# Patient Record
Sex: Female | Born: 1960 | Race: White | Hispanic: No | Marital: Single | Smoking: Current every day smoker
Health system: Southern US, Community
[De-identification: ages and names within clinical notes are randomized; demographics above are authoritative.]

## PROBLEM LIST (undated history)

## (undated) DIAGNOSIS — K219 Gastro-esophageal reflux disease without esophagitis: Secondary | ICD-10-CM

## (undated) DIAGNOSIS — F329 Major depressive disorder, single episode, unspecified: Secondary | ICD-10-CM

## (undated) DIAGNOSIS — H269 Unspecified cataract: Secondary | ICD-10-CM

## (undated) DIAGNOSIS — D259 Leiomyoma of uterus, unspecified: Secondary | ICD-10-CM

## (undated) DIAGNOSIS — E7800 Pure hypercholesterolemia, unspecified: Secondary | ICD-10-CM

## (undated) DIAGNOSIS — I10 Essential (primary) hypertension: Secondary | ICD-10-CM

## (undated) DIAGNOSIS — F32A Depression, unspecified: Secondary | ICD-10-CM

## (undated) DIAGNOSIS — Z9089 Acquired absence of other organs: Secondary | ICD-10-CM

## (undated) DIAGNOSIS — J45909 Unspecified asthma, uncomplicated: Secondary | ICD-10-CM

## (undated) DIAGNOSIS — K5090 Crohn's disease, unspecified, without complications: Secondary | ICD-10-CM

## (undated) DIAGNOSIS — R0602 Shortness of breath: Secondary | ICD-10-CM

## (undated) HISTORY — DX: Unspecified cataract: H26.9

## (undated) HISTORY — PX: OTHER SURGICAL HISTORY: SHX169

## (undated) HISTORY — PX: TONSILLECTOMY: SUR1361

## (undated) HISTORY — PX: LASIK: SHX215

## (undated) HISTORY — DX: Unspecified asthma, uncomplicated: J45.909

## (undated) HISTORY — DX: Leiomyoma of uterus, unspecified: D25.9

## (undated) HISTORY — DX: Acquired absence of other organs: Z90.89

## (undated) HISTORY — PX: TONSILLECTOMY AND ADENOIDECTOMY: SHX28

---

## 2010-11-27 ENCOUNTER — Emergency Department (HOSPITAL_COMMUNITY): Payer: Self-pay

## 2010-11-27 ENCOUNTER — Emergency Department (HOSPITAL_COMMUNITY)
Admission: EM | Admit: 2010-11-27 | Discharge: 2010-11-27 | Disposition: A | Discharge status: Home / Self Care | Payer: Self-pay | Attending: Emergency Medicine | Admitting: Emergency Medicine

## 2010-11-27 DIAGNOSIS — R300 Dysuria: Secondary | ICD-10-CM | POA: Insufficient documentation

## 2010-11-27 DIAGNOSIS — R0789 Other chest pain: Secondary | ICD-10-CM | POA: Insufficient documentation

## 2010-11-27 DIAGNOSIS — N63 Unspecified lump in unspecified breast: Secondary | ICD-10-CM | POA: Insufficient documentation

## 2010-11-27 DIAGNOSIS — R209 Unspecified disturbances of skin sensation: Secondary | ICD-10-CM | POA: Insufficient documentation

## 2010-11-27 DIAGNOSIS — R42 Dizziness and giddiness: Secondary | ICD-10-CM | POA: Insufficient documentation

## 2010-11-27 LAB — APTT: aPTT: 34 seconds (ref 24–37)

## 2010-11-27 LAB — URINALYSIS, ROUTINE W REFLEX MICROSCOPIC
Bilirubin Urine: NEGATIVE
Glucose, UA: NEGATIVE mg/dL
Ketones, ur: NEGATIVE mg/dL
pH: 6 (ref 5.0–8.0)

## 2010-11-27 LAB — CBC
MCH: 29.4 pg (ref 26.0–34.0)
MCV: 86.4 fL (ref 78.0–100.0)
Platelets: 167 10*3/uL (ref 150–400)
RDW: 13.1 % (ref 11.5–15.5)
WBC: 6.8 10*3/uL (ref 4.0–10.5)

## 2010-11-27 LAB — BASIC METABOLIC PANEL
CO2: 25 mEq/L (ref 19–32)
Chloride: 101 mEq/L (ref 96–112)
Potassium: 3.9 mEq/L (ref 3.5–5.1)
Sodium: 136 mEq/L (ref 135–145)

## 2010-11-27 LAB — TROPONIN I: Troponin I: <0.3 ng/mL (ref <0.30)

## 2010-11-27 LAB — URINE MICROSCOPIC-ADD ON

## 2010-11-27 LAB — DIFFERENTIAL
Eosinophils Absolute: 0.3 10*3/uL (ref 0.0–0.7)
Eosinophils Relative: 4 % (ref 0–5)
Lymphs Abs: 1.8 10*3/uL (ref 0.7–4.0)

## 2011-01-31 ENCOUNTER — Emergency Department (HOSPITAL_COMMUNITY): Payer: Self-pay

## 2011-01-31 ENCOUNTER — Emergency Department (HOSPITAL_COMMUNITY)
Admission: EM | Admit: 2011-01-31 | Discharge: 2011-01-31 | Disposition: A | Discharge status: Home / Self Care | Payer: Self-pay | Attending: Emergency Medicine | Admitting: Emergency Medicine

## 2011-01-31 DIAGNOSIS — N938 Other specified abnormal uterine and vaginal bleeding: Secondary | ICD-10-CM | POA: Insufficient documentation

## 2011-01-31 DIAGNOSIS — N949 Unspecified condition associated with female genital organs and menstrual cycle: Secondary | ICD-10-CM | POA: Insufficient documentation

## 2011-01-31 DIAGNOSIS — R5381 Other malaise: Secondary | ICD-10-CM | POA: Insufficient documentation

## 2011-01-31 DIAGNOSIS — Z79899 Other long term (current) drug therapy: Secondary | ICD-10-CM | POA: Insufficient documentation

## 2011-01-31 DIAGNOSIS — E7800 Pure hypercholesterolemia, unspecified: Secondary | ICD-10-CM | POA: Insufficient documentation

## 2011-01-31 DIAGNOSIS — R5383 Other fatigue: Secondary | ICD-10-CM | POA: Insufficient documentation

## 2011-01-31 LAB — WET PREP, GENITAL
Trich, Wet Prep: NONE SEEN
Yeast Wet Prep HPF POC: NONE SEEN

## 2011-01-31 LAB — DIFFERENTIAL
Basophils Absolute: 0 10*3/uL (ref 0.0–0.1)
Eosinophils Absolute: 0.2 10*3/uL (ref 0.0–0.7)
Lymphocytes Relative: 24 % (ref 12–46)
Lymphs Abs: 1.4 10*3/uL (ref 0.7–4.0)
Neutrophils Relative %: 65 % (ref 43–77)

## 2011-01-31 LAB — CBC
HCT: 40.7 % (ref 36.0–46.0)
MCV: 86.2 fL (ref 78.0–100.0)
Platelets: 178 10*3/uL (ref 150–400)
RBC: 4.72 MIL/uL (ref 3.87–5.11)
WBC: 5.8 10*3/uL (ref 4.0–10.5)

## 2011-02-01 LAB — GC/CHLAMYDIA PROBE AMP, GENITAL: Chlamydia, DNA Probe: NEGATIVE

## 2011-06-03 ENCOUNTER — Other Ambulatory Visit: Payer: Self-pay

## 2011-06-03 ENCOUNTER — Emergency Department (HOSPITAL_COMMUNITY)
Admission: EM | Admit: 2011-06-03 | Discharge: 2011-06-04 | Disposition: A | Discharge status: Home / Self Care | Payer: Self-pay | Attending: Emergency Medicine | Admitting: Emergency Medicine

## 2011-06-03 ENCOUNTER — Encounter (HOSPITAL_COMMUNITY): Payer: Self-pay | Admitting: *Deleted

## 2011-06-03 DIAGNOSIS — R109 Unspecified abdominal pain: Secondary | ICD-10-CM | POA: Insufficient documentation

## 2011-06-03 DIAGNOSIS — F419 Anxiety disorder, unspecified: Secondary | ICD-10-CM

## 2011-06-03 DIAGNOSIS — R0789 Other chest pain: Secondary | ICD-10-CM | POA: Insufficient documentation

## 2011-06-03 DIAGNOSIS — F411 Generalized anxiety disorder: Secondary | ICD-10-CM | POA: Insufficient documentation

## 2011-06-03 HISTORY — DX: Major depressive disorder, single episode, unspecified: F32.9

## 2011-06-03 HISTORY — DX: Depression, unspecified: F32.A

## 2011-06-03 LAB — COMPREHENSIVE METABOLIC PANEL
Albumin: 4 g/dL (ref 3.5–5.2)
Alkaline Phosphatase: 119 U/L — ABNORMAL HIGH (ref 39–117)
BUN: 7 mg/dL (ref 6–23)
CO2: 26 mEq/L (ref 19–32)
Chloride: 105 mEq/L (ref 96–112)
Creatinine, Ser: 0.48 mg/dL — ABNORMAL LOW (ref 0.50–1.10)
GFR calc Af Amer: >90 mL/min (ref >90)
GFR calc non Af Amer: >90 mL/min (ref >90)
Glucose, Bld: 97 mg/dL (ref 70–99)
Potassium: 3.6 mEq/L (ref 3.5–5.1)
Total Bilirubin: 0.3 mg/dL (ref 0.3–1.2)

## 2011-06-03 LAB — CBC
HCT: 41.1 % (ref 36.0–46.0)
MCH: 29.5 pg (ref 26.0–34.0)
MCHC: 34.3 g/dL (ref 30.0–36.0)
RDW: 13.7 % (ref 11.5–15.5)

## 2011-06-03 LAB — POCT I-STAT TROPONIN I: Troponin i, poc: 0.01 ng/mL (ref 0.00–0.08)

## 2011-06-03 LAB — CK TOTAL AND CKMB (NOT AT ARMC): CK, MB: 1.9 ng/mL (ref 0.3–4.0)

## 2011-06-03 NOTE — ED Notes (Signed)
Chest pain vomiting for 3-4 days with numbness in her arms and her ears have been ringing

## 2011-06-03 NOTE — ED Notes (Signed)
New and old ecg shown to edp [place]

## 2011-06-04 ENCOUNTER — Emergency Department (HOSPITAL_COMMUNITY): Payer: Self-pay

## 2011-06-04 LAB — LIPASE, BLOOD: Lipase: 26 U/L (ref 11–59)

## 2011-06-04 MED ORDER — FAMOTIDINE IN NACL 20-0.9 MG/50ML-% IV SOLN
20.0000 mg | Freq: Once | INTRAVENOUS | Status: AC
  Administered 2011-06-04: 20 mg via INTRAVENOUS
  Filled 2011-06-04: qty 50

## 2011-06-04 MED ORDER — ALPRAZOLAM 0.25 MG PO TABS: 0.2500 mg | ORAL_TABLET | Freq: Every evening | ORAL | Status: DC | PRN

## 2011-06-04 MED ORDER — SERTRALINE HCL 50 MG PO TABS: 50.0000 mg | ORAL_TABLET | Freq: Every day | ORAL | Status: DC

## 2011-06-04 MED ORDER — MORPHINE SULFATE 2 MG/ML IJ SOLN
2.0000 mg | Freq: Once | INTRAMUSCULAR | Status: AC
  Administered 2011-06-04: 2 mg via INTRAVENOUS
  Filled 2011-06-04: qty 1

## 2011-06-04 MED ORDER — LORAZEPAM 2 MG/ML IJ SOLN
1.0000 mg | Freq: Once | INTRAMUSCULAR | Status: AC
  Administered 2011-06-04: 1 mg via INTRAVENOUS
  Filled 2011-06-04: qty 1

## 2011-06-04 NOTE — Discharge Instructions (Signed)
Abdominal Pain (Nonspecific) Your exam might not show the exact reason you have abdominal pain. Since there are many different causes of abdominal pain, another checkup and more tests may be needed. It is very important to follow up for lasting (persistent) or worsening symptoms. A possible cause of abdominal pain in any person who still has his or her appendix is acute appendicitis. Appendicitis is often hard to diagnose. Normal blood tests, urine tests, ultrasound, and CT scans do not completely rule out early appendicitis or other causes of abdominal pain. Sometimes, only the changes that happen over time will allow appendicitis and other causes of abdominal pain to be determined. Other potential problems that may require surgery may also take time to become more apparent. Because of this, it is important that you follow all of the instructions below. HOME CARE INSTRUCTIONS   Rest as much as possible.   Do not eat solid food until your pain is gone.   While adults or children have pain: A diet of water, weak decaffeinated tea, broth or bouillon, gelatin, oral rehydration solutions (ORS), frozen ice pops, or ice chips may be helpful.   When pain is gone in adults or children: Start a light diet (dry toast, crackers, applesauce, or white rice). Increase the diet slowly as long as it does not bother you. Eat no dairy products (including cheese and eggs) and no spicy, fatty, fried, or high-fiber foods.   Use no alcohol, caffeine, or cigarettes.   Take your regular medicines unless your caregiver told you not to.   Take any prescribed medicine as directed.   Only take over-the-counter or prescription medicines for pain, discomfort, or fever as directed by your caregiver. Do not give aspirin to children.  If your caregiver has given you a follow-up appointment, it is very important to keep that appointment. Not keeping the appointment could result in a permanent injury and/or lasting (chronic) pain  and/or disability. If there is any problem keeping the appointment, you must call to reschedule.  SEEK IMMEDIATE MEDICAL CARE IF:   Your pain is not gone in 24 hours.   Your pain becomes worse, changes location, or feels different.   You or your child has an oral temperature above 102 F (38.9 C), not controlled by medicine.   Your baby is older than 3 months with a rectal temperature of 102 F (38.9 C) or higher.   Your baby is 63 months old or younger with a rectal temperature of 100.4 F (38 C) or higher.   You have shaking chills.   You keep throwing up (vomiting) or cannot drink liquids.   There is blood in your vomit or you see blood in your bowel movements.   Your bowel movements become dark or black.   You have frequent bowel movements.   Your bowel movements stop (become blocked) or you cannot pass gas.   You have bloody, frequent, or painful urination.   You have yellow discoloration in the skin or whites of the eyes.   Your stomach becomes bloated or bigger.   You have dizziness or fainting.   You have chest or back pain.  MAKE SURE YOU:   Understand these instructions.   Will watch your condition.   Will get help right away if you are not doing well or get worse.  Document Released: 03/25/2005 Document Revised: 12/05/2010 Document Reviewed: 02/20/2009 [facility] Patient Information 2012 [place], [place].     Anxiety and Panic Attacks Your caregiver has informed you  that you are having an anxiety or panic attack. There may be many forms of this. Most of the time these attacks come suddenly and without warning. They come at any time of day, including periods of sleep, and at any time of life. They may be strong and unexplained. Although panic attacks are very scary, they are physically harmless. Sometimes the cause of your anxiety is not known. Anxiety is a protective mechanism of the body in its fight or flight mechanism. Most of these perceived danger  situations are actually nonphysical situations (such as anxiety over losing a job). CAUSES  The causes of an anxiety or panic attack are many. Panic attacks may occur in otherwise healthy people given a certain set of circumstances. There may be a genetic cause for panic attacks. Some medications may also have anxiety as a side effect. SYMPTOMS  Some of the most common feelings are:  Intense terror.   Dizziness, feeling faint.   Hot and cold flashes.   Fear of going crazy.   Feelings that nothing is real.   Sweating.   Shaking.   Chest pain or a fast heartbeat (palpitations).   Smothering, choking sensations.   Feelings of impending doom and that death is near.   Tingling of extremities, this may be from over-breathing.   Altered reality (derealization).   Being detached from yourself (depersonalization).  Several symptoms can be present to make up anxiety or panic attacks. DIAGNOSIS  The evaluation by your caregiver will depend on the type of symptoms you are experiencing. The diagnosis of anxiety or panic attack is made when no physical illness can be determined to be a cause of the symptoms. TREATMENT  Treatment to prevent anxiety and panic attacks may include:  Avoidance of circumstances that cause anxiety.   Reassurance and relaxation.   Regular exercise.   Relaxation therapies, such as yoga.   Psychotherapy with a psychiatrist or therapist.   Avoidance of caffeine, alcohol and illegal drugs.   Prescribed medication.  SEEK IMMEDIATE MEDICAL CARE IF:   You experience panic attack symptoms that are different than your usual symptoms.   You have any worsening or concerning symptoms.  Document Released: 03/25/2005 Document Revised: 12/05/2010 Document Reviewed: 07/27/2009 [facility] Patient Information 2012 [place], [place].    Chest Pain (Nonspecific) It is often hard to give a specific diagnosis for the cause of chest pain. There is always a chance that  your pain could be related to something serious, such as a heart attack or a blood clot in the lungs. You need to follow up with your caregiver for further evaluation. CAUSES   Heartburn.   Pneumonia or bronchitis.   Anxiety and stress.   Inflammation around your heart (pericarditis) or lung (pleuritis or pleurisy).   A blood clot in the lung.   A collapsed lung (pneumothorax). It can develop suddenly on its own (spontaneous pneumothorax) or from injury (trauma) to the chest.  The chest wall is composed of bones, muscles, and cartilage. Any of these can be the source of the pain.  The bones can be bruised by injury.   The muscles or cartilage can be strained by coughing or overwork.   The cartilage can be affected by inflammation and become sore (costochondritis).  DIAGNOSIS  Lab tests or other studies, such as X-rays, an EKG, stress testing, or cardiac imaging, may be needed to find the cause of your pain.  TREATMENT   Treatment depends on what may be causing your chest pain.  Treatment may include:   Acid blockers for heartburn.   Anti-inflammatory medicine.   Pain medicine for inflammatory conditions.   Antibiotics if an infection is present.   You may be advised to change lifestyle habits. This includes stopping smoking and avoiding caffeine and chocolate.   You may be advised to keep your head raised (elevated) when sleeping. This reduces the chance of acid going backward from your stomach into your esophagus.   Most of the time, nonspecific chest pain will improve within 2 to 3 days with rest and mild pain medicine.  HOME CARE INSTRUCTIONS   If antibiotics were prescribed, take the full amount even if you start to feel better.   For the next few days, avoid physical activities that bring on chest pain. Continue physical activities as directed.   Do not smoke cigarettes or drink alcohol until your symptoms are gone.   Only take over-the-counter or prescription  medicine for pain, discomfort, or fever as directed by your caregiver.   Follow your caregiver's suggestions for further testing if your chest pain does not go away.   Keep any follow-up appointments you made. If you do not go to an appointment, you could develop lasting (chronic) problems with pain. If there is any problem keeping an appointment, you must call to reschedule.  SEEK MEDICAL CARE IF:   You think you are having problems from the medicine you are taking. Read your medicine instructions carefully.   Your chest pain does not go away, even after treatment.   You develop a rash with blisters on your chest.  SEEK IMMEDIATE MEDICAL CARE IF:   You have increased chest pain or pain that spreads to your arm, neck, jaw, back, or belly (abdomen).   You develop shortness of breath, an increasing cough, or you are coughing up blood.   You have severe back or abdominal pain, feel sick to your stomach (nauseous) or throw up (vomit).   You develop severe weakness, fainting, or chills.   You have an oral temperature above 102 F (38.9 C), not controlled by medicine.  THIS IS AN EMERGENCY. Do not wait to see if the pain will go away. Get medical help at once. Call your local emergency services (911 in [place] Do not drive yourself to the hospital. MAKE SURE YOU:   Understand these instructions.   Will watch your condition.   Will get help right away if you are not doing well or get worse.  Document Released: 01/02/2005 Document Revised: 12/05/2010 Document Reviewed: 10/29/2007 [facility] Patient Information 2012 [place], [place].

## 2011-06-04 NOTE — ED Notes (Signed)
Patient is AOx4 and comfortable with her discharge instructions. 

## 2011-06-04 NOTE — ED Notes (Signed)
Patient transported to X-ray 

## 2011-06-04 NOTE — ED Provider Notes (Signed)
History     CSN: [id number]  Arrival date & time 06/03/11  2115   First MD Initiated Contact with Patient 06/04/11 0002      Chief Complaint  Patient presents with  . Chest Pain   this is a 51 year old female with a known history of depression. History is obtained from the patient and her family. She describes multiple complaints none of which she can states bothers her more than the others. She has abdominal pain, nausea, and vomiting. She also has some vague, left-sided chest pain, which is nonradiating, not associated with exertion. She is a nondrinker, but does smoke. She also has some chronic neck and back pain and describes some chronic numbness in her extremities. She denies any focal, motor weakness. The patient and the family. Both state that stress is the main factor that aggravates her symptoms. She has had no suicidal or homicidal ideation. She does take Zoloft. These symptoms have been constant over the past 4 days. Also noted in triage at her ears have been ringing. Of note, the chart from August was reviewed at which point in time. Patient had a normal MRI of her brain. She has had no significant shortness of breath. No pleuritic pain. No calf pain.  (Consider location/radiation/quality/duration/timing/severity/associated sxs/prior treatment) HPI  Past Medical History  Diagnosis Date  . Depressed     History reviewed. No pertinent past surgical history.  History reviewed. No pertinent family history.  History  Substance Use Topics  . Smoking status: Current Everyday Smoker  . Smokeless tobacco: Not on file  . Alcohol Use: No    OB History    Grav Para Term Preterm Abortions TAB SAB Ect Mult Living                  Review of Systems  All other systems reviewed and are negative.    Allergies  Review of patient's allergies indicates no known allergies.  Home Medications   Current Outpatient Rx  Name Route Sig Dispense Refill  . ROSUVASTATIN CALCIUM 10 MG  PO TABS Oral Take 10 mg by mouth daily.    . SERTRALINE HCL 50 MG PO TABS Oral Take 50 mg by mouth daily.      BP 139/91  Pulse 63  Temp(Src) 98.1 F (36.7 C) (Oral)  Resp 16  SpO2 97%  Physical Exam  Nursing note and vitals reviewed. Constitutional: She is oriented to person, place, and time. She appears well-developed and well-nourished.  HENT:  Head: Normocephalic and atraumatic.  Eyes: Conjunctivae and EOM are normal. Pupils are equal, round, and reactive to light.  Neck: Neck supple.  Cardiovascular: Normal rate and regular rhythm.  Exam reveals no gallop and no friction rub.   No murmur heard. Pulmonary/Chest: Effort normal and breath sounds normal. No respiratory distress. She has no wheezes. She has no rales. She exhibits no tenderness.  Abdominal: Soft. Bowel sounds are normal. She exhibits no distension. There is no tenderness. There is no rebound and no guarding.       Patient easily distracted during abdominal exam. Benign belly. No rebound, rigidity or guarding. Bowel sounds are normal.  Musculoskeletal: Normal range of motion. She exhibits no edema and no tenderness.  Neurological: She is alert and oriented to person, place, and time. No cranial nerve deficit. Coordination normal.  Skin: Skin is warm and dry. No rash noted.  Psychiatric: She has a normal mood and affect.    ED Course  Procedures (including critical care time)  Labs Reviewed  COMPREHENSIVE METABOLIC PANEL - Abnormal; Notable for the following:    Creatinine, Ser 0.48 (*)    Alkaline Phosphatase 119 (*)    All other components within normal limits  CBC  CK TOTAL AND CKMB  POCT I-STAT TROPONIN I   No results found.   No diagnosis found.    MDM  Pt is seen and examined;  Initial history and physical completed.  Will follow.     12-lead ECG is completely normal. Vital signs are normal. Patient appears to be in no acute distress but does have some anxiety, which apparently is chronic. Her  CK-MB and troponin, were negative. Her electrolytes were normal glucose, kidney function was normal. AST and ALT were normal. Alkaline phosphatase was slightly elevated. Her CBC was normal.   Awaiting chest x-ray.  We'll treat symptoms with Ativan, Pepcid, and morphine. Suspect some of this may be due to peptic ulcer disease. In the setting of constant pain for 3-4 days. The initial one. Troponin is reassuring. I feel the rest of her studies are normal and she feels better. She can likely go home and follow with a primary care physician. May need outpatient endoscopy. If symptoms persist. No history of diabetes or hypertension or any strong family history that can be obtained.   Date: 06/04/2011  Rate: 65  Rhythm: normal sinus rhythm  QRS Axis: normal  Intervals: normal  ST/T Wave abnormalities: normal  Conduction Disutrbances:none  Narrative Interpretation:   Old EKG Reviewed: unchanged  No significant changes compared to 11/27/2010   Results for orders placed during the hospital encounter of 06/03/11  CBC      Component Value Range   WBC 6.6  4.0 - 10.5 (K/uL)   RBC 4.78  3.87 - 5.11 (MIL/uL)   Hemoglobin 14.1  12.0 - 15.0 (g/dL)   HCT 16.1  09.6 - 04.5 (%)   MCV 86.0  78.0 - 100.0 (fL)   MCH 29.5  26.0 - 34.0 (pg)   MCHC 34.3  30.0 - 36.0 (g/dL)   RDW 40.9  81.1 - 91.4 (%)   Platelets 173  150 - 400 (K/uL)  CK TOTAL AND CKMB      Component Value Range   Total CK 51  7 - 177 (U/L)   CK, MB 1.9  0.3 - 4.0 (ng/mL)   Relative Index RELATIVE INDEX IS INVALID  0.0 - 2.5   COMPREHENSIVE METABOLIC PANEL      Component Value Range   Sodium 137  135 - 145 (mEq/L)   Potassium 3.6  3.5 - 5.1 (mEq/L)   Chloride 105  96 - 112 (mEq/L)   CO2 26  19 - 32 (mEq/L)   Glucose, Bld 97  70 - 99 (mg/dL)   BUN 7  6 - 23 (mg/dL)   Creatinine, Ser 7.82 (*) 0.50 - 1.10 (mg/dL)   Calcium 9.8  8.4 - 95.6 (mg/dL)   Total Protein 7.1  6.0 - 8.3 (g/dL)   Albumin 4.0  3.5 - 5.2 (g/dL)   AST 16  0 -  37 (U/L)   ALT 16  0 - 35 (U/L)   Alkaline Phosphatase 119 (*) 39 - 117 (U/L)   Total Bilirubin 0.3  0.3 - 1.2 (mg/dL)   GFR calc non Af Amer >90  >90 (mL/min)   GFR calc Af Amer >90  >90 (mL/min)  POCT I-STAT TROPONIN I      Component Value Range   Troponin i, poc 0.01  0.00 - 0.08 (ng/mL)   Comment 3           LIPASE, BLOOD      Component Value Range   Lipase 26  11 - 59 (U/L)   No results found.       Filed Vitals:   06/04/11 0118  BP: 104/70  Pulse: 53  Temp:   Resp: 15   Lab tests are unremarkable, chest x-ray unremarkable.  Patient is feeling much better after medications. Chest pain is very atypical and there is certainly a factor of stress or anxiety. Patient is jovial smiling. She is requesting refills for Zoloft. We'll also recommend an over-the-counter PPI and give her a short course of Xanax.  She is strongly encouraged to followup with her primary care doctor this week. Furthermore, the patient was encouraged to return to the ED for any persisting or changing or concerning symptoms such as chest pain, back pain, shortness of breath, dizziness, etc. Otherwise, will be stable for outpatient followup.   Jordann Grime A. Patrica Duel, MD 06/04/11 [id number]

## 2011-06-26 ENCOUNTER — Ambulatory Visit (INDEPENDENT_AMBULATORY_CARE_PROVIDER_SITE_OTHER): Payer: Self-pay | Admitting: Family Medicine

## 2011-06-26 ENCOUNTER — Encounter: Payer: Self-pay | Admitting: Family Medicine

## 2011-06-26 DIAGNOSIS — H7290 Unspecified perforation of tympanic membrane, unspecified ear: Secondary | ICD-10-CM | POA: Insufficient documentation

## 2011-06-26 DIAGNOSIS — K5900 Constipation, unspecified: Secondary | ICD-10-CM | POA: Insufficient documentation

## 2011-06-26 DIAGNOSIS — F329 Major depressive disorder, single episode, unspecified: Secondary | ICD-10-CM

## 2011-06-26 DIAGNOSIS — E7800 Pure hypercholesterolemia, unspecified: Secondary | ICD-10-CM | POA: Insufficient documentation

## 2011-06-26 DIAGNOSIS — D259 Leiomyoma of uterus, unspecified: Secondary | ICD-10-CM | POA: Insufficient documentation

## 2011-06-26 MED ORDER — SERTRALINE HCL 50 MG PO TABS: 50.0000 mg | ORAL_TABLET | Freq: Every day | ORAL | Status: DC

## 2011-06-26 MED ORDER — ROSUVASTATIN CALCIUM 10 MG PO TABS: 10.0000 mg | ORAL_TABLET | Freq: Every day | ORAL | Status: DC

## 2011-06-26 MED ORDER — MIRTAZAPINE 15 MG PO TABS: 15.0000 mg | ORAL_TABLET | Freq: Every day | ORAL | Status: DC

## 2011-06-26 NOTE — Assessment & Plan Note (Signed)
Patient on Crestor. Unsure of current LDL. Had CMet in the hospital, which was within normal limits. Will get fasting lipids when patient can afford labs.

## 2011-06-26 NOTE — Progress Notes (Signed)
Subjective:     Patient ID: Lindsey Massey, female   DOB: [birth year], 51 y.o.   MRN: [id number]  HPI Patient is presenting to establish care today. She has not been previously seen by a PCP in the area. She was evaluated in ED in February for anxiety and abdominal pain. Sister is present in the room. She has many complaints today but due to time limitations, we only addressed the following problems with a plan to return to clinic to address other issues:  1. Right ear pain- Patient state she has had ear pain for a few months. It radiates to her cheek, down her neck and under her chin. She does not have a sore throat. She does have dental issues and wears a sleep guard, but that is not related to current pain. She does not have fevers or redness of the skin. Patient denies any trauma although she does clean out her ears regularly. She states she dropped a piece of rice in her ear as a child, but that has never been an issue. Nothing like this has ever happened to her before. No congestion, runny nose, discharge from ear or changes in hearing.  2. Constipation- Patient has history of constipation. She takes olive oil and flax seed oil to help with her constipation. Her last bowel movement was a few days ago. She does have pain associated with her bowel movements. Patient states she normally eats a balanced diet but recently she has not had much of a appetite. She has lost some weight due to this. She has never tried medication for her constipation.  3. Lower abdominal pain- Patient has a history of uterine fibroids which was diagnosed in [place] where she formerly lived. She has heavy, painful periods. LMP in Sept 2012 which lasted a long time. She is having hot flashes and other signs of menopause. Her pain is intermittent and she notices it more with palpation.  PMH: Depression, tobacco use, anxiety, fibroids Health Maintenance: Last mammo- March 2012. Last pap- March 2012. No flu shot. PSH:  Tonsillectomy FHx:  Family History  Problem Relation Age of Onset  . Cancer Father     liver  . Hypertension Father   . Hypertension Mother   . Heart disease Mother   . Arthritis Sister   . Hypertension Brother   . Heart disease Paternal Uncle    SHx:  History   Social History  . Marital Status: Single    Spouse Name: N/A    Number of Children: N/A  . Years of Education: N/A   Occupational History  . Not on file.   Social History Main Topics  . Smoking status: Current Everyday Smoker -- 0.3 packs/day  . Smokeless tobacco: Not on file  . Alcohol Use: No  . Drug Use: No  . Sexually Active: No   Other Topics Concern  . Not on file   Social History Narrative   Lives with sister and nephews (18,16) in [place]. Moved from [place] in April 2012. Separated from husband. No children. Does not work.Balanced diet. Little exercise.Christian faith.     Review of Systems Please see HPI above. Otherwise, negative    Objective:   Physical Exam  Constitutional: She is oriented to person, place, and time. She appears well-developed and well-nourished. No distress.  HENT:  Head: Normocephalic and atraumatic.  Right Ear: Hearing and ear canal normal. Tympanic membrane is perforated. Tympanic membrane is not scarred and not erythematous.  Left Ear: Hearing, tympanic membrane  and ear canal normal.  Nose: Nose normal.  Mouth/Throat: Oropharynx is clear and moist. No oropharyngeal exudate.  Eyes: Pupils are equal, round, and reactive to light.  Neck: Normal range of motion. No thyromegaly present.  Cardiovascular: Normal rate and regular rhythm.   Pulmonary/Chest: Effort normal and breath sounds normal. She has no wheezes.  Abdominal: Soft. There is tenderness (Tenderness medial right lower quadrant. No masses palpated).  Musculoskeletal: Normal range of motion. She exhibits no edema.  Lymphadenopathy:    She has no cervical adenopathy.  Neurological: She is alert and oriented  to person, place, and time. No cranial nerve deficit.  Skin: Skin is warm and dry. No rash noted.  Psychiatric: She has a normal mood and affect. Her behavior is normal.       Assessment:     51 yo F presenting to establish care    Plan:

## 2011-06-26 NOTE — Assessment & Plan Note (Signed)
Noted on exam today. Patient given information about this. Unsure of mechanism or how old the injury is but suspect this happened from cleaning her ears. Patient is awaiting [employer] or Medicaid. Once she has insurance, can refer to ENT if the ear has not improved. Until then, encouraged her to use Motrin as needed for pain or heat to help as well.

## 2011-06-26 NOTE — Assessment & Plan Note (Signed)
History of constipation. Uses olive oil and flaxseed oil. Encouraged her to eat regular diet with increased fiber. Also recommended using Miralax daily as needed for constipation. Patient agrees.

## 2011-06-26 NOTE — Patient Instructions (Addendum)
  Eardrum Perforation The eardrum is a thin, round tissue inside the ear. It allows you to hear. The eardrum can get torn (perforated). Eardrums often heal on their own. There is often little or no long-term hearing loss. HOME CARE   Keep your ear dry while it heals. Do not swim, dive, or take showers until your doctor says it is okay.   Before you take a bath, put petroleum jelly all over a cotton ball. Put the cotton ball in your ear. This will keep water out.   Only take medicines as told by your doctor.   Blow your nose gently.   Continue normal activities after your eardrum heals. Your doctor will tell you when your eardrum has healed.   Talk to your doctor before flying on an airplane.   Keep all doctor visits as told. This is important.  GET HELP RIGHT AWAY IF:   You have blood or yellowish-white fluid (pus) coming from your ear.   You feel off balance.   You feel dizzy, sick to your stomach (nauseous), or you throw up (vomit).   You have more pain.   You have a fever.  MAKE SURE YOU:   Understand these instructions.   Will watch your condition.   Will get help right away if you are not doing well or get worse.  Document Released: 09/12/2009 Document Revised: 03/14/2011 Document Reviewed: 09/12/2009 [facility] Patient Information 2012 [place], [place]. CBC    Component Value Date/Time   WBC 6.6 06/03/2011 2135   RBC 4.78 06/03/2011 2135   HGB 14.1 06/03/2011 2135   HCT 41.1 06/03/2011 2135   PLT 173 06/03/2011 2135   MCV 86.0 06/03/2011 2135   MCH 29.5 06/03/2011 2135   MCHC 34.3 06/03/2011 2135   RDW 13.7 06/03/2011 2135   LYMPHSABS 1.4 01/31/2011 1539   MONOABS 0.5 01/31/2011 1539   EOSABS 0.2 01/31/2011 1539   BASOSABS 0.0 01/31/2011 1539    BMET    Component Value Date/Time   NA 137 06/03/2011 2135   K 3.6 06/03/2011 2135   CL 105 06/03/2011 2135   CO2 26 06/03/2011 2135   GLUCOSE 97 06/03/2011 2135   BUN 7 06/03/2011 2135   CREATININE 0.48* 06/03/2011 2135   CALCIUM 9.8 06/03/2011 2135   GFRNONAA >90 06/03/2011 2135   GFRAA >90 06/03/2011 2135

## 2011-06-26 NOTE — Assessment & Plan Note (Signed)
Patient states she has had depression for a long time. She is on Zoloft which is working relatively well for her. Patient denies SI/HI. Patient needs more thorough work up of her depression, including TSH, which we will do once [facility] care is approved. If she has an issues or thoughts of hurting herself, she should go to ER or call 911.

## 2011-06-26 NOTE — Assessment & Plan Note (Signed)
Most likely cause of her abdominal pain. She has not had a menstrual cycle since Sept 2012, but is also going through menopause. If pain continues, will get ultrasound to better evaluate and refer to Gyn as needed.

## 2011-06-27 ENCOUNTER — Emergency Department (HOSPITAL_COMMUNITY)
Admission: EM | Admit: 2011-06-27 | Discharge: 2011-06-27 | Disposition: A | Discharge status: Home / Self Care | Payer: Self-pay | Attending: Emergency Medicine | Admitting: Emergency Medicine

## 2011-06-27 ENCOUNTER — Other Ambulatory Visit: Payer: Self-pay

## 2011-06-27 ENCOUNTER — Emergency Department (HOSPITAL_COMMUNITY): Payer: Self-pay

## 2011-06-27 DIAGNOSIS — K7689 Other specified diseases of liver: Secondary | ICD-10-CM | POA: Insufficient documentation

## 2011-06-27 DIAGNOSIS — R110 Nausea: Secondary | ICD-10-CM | POA: Insufficient documentation

## 2011-06-27 DIAGNOSIS — R0602 Shortness of breath: Secondary | ICD-10-CM | POA: Insufficient documentation

## 2011-06-27 DIAGNOSIS — R6883 Chills (without fever): Secondary | ICD-10-CM | POA: Insufficient documentation

## 2011-06-27 DIAGNOSIS — F3289 Other specified depressive episodes: Secondary | ICD-10-CM | POA: Insufficient documentation

## 2011-06-27 DIAGNOSIS — Z79899 Other long term (current) drug therapy: Secondary | ICD-10-CM | POA: Insufficient documentation

## 2011-06-27 DIAGNOSIS — R079 Chest pain, unspecified: Secondary | ICD-10-CM | POA: Insufficient documentation

## 2011-06-27 DIAGNOSIS — R1013 Epigastric pain: Secondary | ICD-10-CM | POA: Insufficient documentation

## 2011-06-27 DIAGNOSIS — F329 Major depressive disorder, single episode, unspecified: Secondary | ICD-10-CM | POA: Insufficient documentation

## 2011-06-27 DIAGNOSIS — K769 Liver disease, unspecified: Secondary | ICD-10-CM

## 2011-06-27 DIAGNOSIS — M810 Age-related osteoporosis without current pathological fracture: Secondary | ICD-10-CM | POA: Insufficient documentation

## 2011-06-27 DIAGNOSIS — R630 Anorexia: Secondary | ICD-10-CM | POA: Insufficient documentation

## 2011-06-27 DIAGNOSIS — R634 Abnormal weight loss: Secondary | ICD-10-CM | POA: Insufficient documentation

## 2011-06-27 LAB — DIFFERENTIAL
Basophils Absolute: 0 10*3/uL (ref 0.0–0.1)
Lymphocytes Relative: 16 % (ref 12–46)
Lymphs Abs: 1.2 10*3/uL (ref 0.7–4.0)
Neutro Abs: 5.2 10*3/uL (ref 1.7–7.7)
Neutrophils Relative %: 74 % (ref 43–77)

## 2011-06-27 LAB — COMPREHENSIVE METABOLIC PANEL
ALT: 24 U/L (ref 0–35)
AST: 20 U/L (ref 0–37)
Alkaline Phosphatase: 110 U/L (ref 39–117)
CO2: 27 mEq/L (ref 19–32)
Calcium: 9.7 mg/dL (ref 8.4–10.5)
Chloride: 105 mEq/L (ref 96–112)
GFR calc Af Amer: >90 mL/min (ref >90)
GFR calc non Af Amer: >90 mL/min (ref >90)
Glucose, Bld: 89 mg/dL (ref 70–99)
Potassium: 4 mEq/L (ref 3.5–5.1)
Sodium: 139 mEq/L (ref 135–145)
Total Bilirubin: 0.2 mg/dL — ABNORMAL LOW (ref 0.3–1.2)

## 2011-06-27 LAB — CBC
Platelets: 185 10*3/uL (ref 150–400)
RBC: 4.82 MIL/uL (ref 3.87–5.11)
RDW: 13.9 % (ref 11.5–15.5)
WBC: 7 10*3/uL (ref 4.0–10.5)

## 2011-06-27 LAB — D-DIMER, QUANTITATIVE: D-Dimer, Quant: 0.48 ug/mL-FEU (ref 0.00–0.48)

## 2011-06-27 MED ORDER — SODIUM CHLORIDE 0.9 % IV BOLUS (SEPSIS)
1000.0000 mL | Freq: Once | INTRAVENOUS | Status: AC
  Administered 2011-06-27: 1000 mL via INTRAVENOUS

## 2011-06-27 MED ORDER — ONDANSETRON HCL 4 MG/2ML IJ SOLN
4.0000 mg | Freq: Once | INTRAMUSCULAR | Status: AC
  Administered 2011-06-27: 4 mg via INTRAVENOUS
  Filled 2011-06-27: qty 2

## 2011-06-27 MED ORDER — HYDROMORPHONE HCL PF 1 MG/ML IJ SOLN
1.0000 mg | Freq: Once | INTRAMUSCULAR | Status: AC
  Administered 2011-06-27: 1 mg via INTRAVENOUS
  Filled 2011-06-27: qty 1

## 2011-06-27 NOTE — ED Provider Notes (Signed)
History     CSN: [id number]  Arrival date & time 06/27/11  1529   First MD Initiated Contact with Patient 06/27/11 1556      Chief Complaint  Patient presents with  . Chest Pain    (Consider location/radiation/quality/duration/timing/severity/associated sxs/prior treatment) HPI Patient with epigastric pain began about thirty minutes pte while sitting in chair.  Pain above left breast and radiates below left breast feels like something is opening and closing.  Patient denies habing this before but has been seen for similar pain but not as severe and not radiating.  Some shortness of breath, pain increases with breathing, no cough, no fever.  Patient is having chills for about an hour.  Patient with some epigastric pain and nausea but no vomiting.  Patient with decreased appetite and did not eat lunch but had regular breakfast.  Appetite has been decreased for a week to ten days with decrease weight of 10 lbs over two weeks unplanned.  Patient with decreased bowel movement with last bowel movement this a.m. After taking .  PMD none.  Additional history obtained through translator patient states that she has had nausea and not felt well for 2 weeks. She has had epigastric pain and chills today. She ate breakfast does not have any lunch. She is complaining of pain throughout the upper abdomen and above the left breast. Past Medical History  Diagnosis Date  . Depressed   . Osteoporosis   . Uterine fibroid     Past Surgical History  Procedure Date  . Tonsillectomy and adenoidectomy     Family History  Problem Relation Age of Onset  . Cancer Father     liver  . Hypertension Father   . Hypertension Mother   . Heart disease Mother   . Arthritis Sister   . Hypertension Brother   . Heart disease Paternal Uncle     History  Substance Use Topics  . Smoking status: Current Everyday Smoker -- 0.3 packs/day  . Smokeless tobacco: Not on file  . Alcohol Use: No    OB History    Grav  Para Term Preterm Abortions TAB SAB Ect Mult Living   1    1  1          Review of Systems  All other systems reviewed and are negative.    Allergies  Review of patient's allergies indicates no known allergies.  Home Medications   Current Outpatient Rx  Name Route Sig Dispense Refill  . MIRTAZAPINE 15 MG PO TABS Oral Take 1 tablet (15 mg total) by mouth at bedtime. 30 tablet 5  . ROSUVASTATIN CALCIUM 10 MG PO TABS Oral Take 1 tablet (10 mg total) by mouth daily. 30 tablet 5  . SERTRALINE HCL 50 MG PO TABS Oral Take 1 tablet (50 mg total) by mouth daily. 30 tablet 5    BP 139/84  Pulse 69  Resp 28  SpO2 100%  LMP 12/27/2010  Physical Exam  Nursing note and vitals reviewed. Constitutional: She appears well-developed and well-nourished.  HENT:  Head: Normocephalic and atraumatic.  Right Ear: External ear normal.  Left Ear: External ear normal.  Mouth/Throat: Oropharynx is clear and moist.  Eyes: Conjunctivae and EOM are normal. Pupils are equal, round, and reactive to light.  Neck: Normal range of motion. Neck supple.  Cardiovascular: Normal rate, regular rhythm, normal heart sounds and intact distal pulses.   Pulmonary/Chest: Effort normal and breath sounds normal.       Tenderness left parasternal  border reproduces chest pain.  Abdominal: Soft. Bowel sounds are normal. There is tenderness.       Tenderness epigastrium and right upper quadrant.  Musculoskeletal: Normal range of motion.  Neurological: She is alert.  Skin: Skin is warm and dry.  Psychiatric: She has a normal mood and affect. Thought content normal.    ED Course  Procedures (including critical care time)  Labs Reviewed - No data to display No results found.   No diagnosis found.  Results for orders placed during the hospital encounter of 06/27/11  CBC      Component Value Range   WBC 7.0  4.0 - 10.5 (K/uL)   RBC 4.82  3.87 - 5.11 (MIL/uL)   Hemoglobin 14.2  12.0 - 15.0 (g/dL)   HCT 16.1  09.6  - 04.5 (%)   MCV 86.7  78.0 - 100.0 (fL)   MCH 29.5  26.0 - 34.0 (pg)   MCHC 34.0  30.0 - 36.0 (g/dL)   RDW 40.9  81.1 - 91.4 (%)   Platelets 185  150 - 400 (K/uL)  DIFFERENTIAL      Component Value Range   Neutrophils Relative 74  43 - 77 (%)   Neutro Abs 5.2  1.7 - 7.7 (K/uL)   Lymphocytes Relative 16  12 - 46 (%)   Lymphs Abs 1.2  0.7 - 4.0 (K/uL)   Monocytes Relative 8  3 - 12 (%)   Monocytes Absolute 0.6  0.1 - 1.0 (K/uL)   Eosinophils Relative 1  0 - 5 (%)   Eosinophils Absolute 0.1  0.0 - 0.7 (K/uL)   Basophils Relative 0  0 - 1 (%)   Basophils Absolute 0.0  0.0 - 0.1 (K/uL)  COMPREHENSIVE METABOLIC PANEL      Component Value Range   Sodium 139  135 - 145 (mEq/L)   Potassium 4.0  3.5 - 5.1 (mEq/L)   Chloride 105  96 - 112 (mEq/L)   CO2 27  19 - 32 (mEq/L)   Glucose, Bld 89  70 - 99 (mg/dL)   BUN 7  6 - 23 (mg/dL)   Creatinine, Ser 7.82  0.50 - 1.10 (mg/dL)   Calcium 9.7  8.4 - 95.6 (mg/dL)   Total Protein 7.2  6.0 - 8.3 (g/dL)   Albumin 3.9  3.5 - 5.2 (g/dL)   AST 20  0 - 37 (U/L)   ALT 24  0 - 35 (U/L)   Alkaline Phosphatase 110  39 - 117 (U/L)   Total Bilirubin 0.2 (*) 0.3 - 1.2 (mg/dL)   GFR calc non Af Amer >90  >90 (mL/min)   GFR calc Af Amer >90  >90 (mL/min)  LIPASE, BLOOD      Component Value Range   Lipase 30  11 - 59 (U/L)  TROPONIN I      Component Value Range   Troponin I <0.30  <0.30 (ng/mL)  D-DIMER, QUANTITATIVE      Component Value Range   D-Dimer, Quant 0.48  0.00 - 0.48 (ug/mL-FEU)     MDM   Date: 06/27/2011  Rate: 66  Rhythm: normal sinus rhythm  QRS Axis: normal  Intervals: normal  ST/T Wave abnormalities: normal  Conduction Disutrbances: none  Narrative Interpretation: unremarkable  Dg Chest 2 View  06/27/2011  *RADIOLOGY REPORT*  Clinical Data: Chest pain and cough.  CHEST - 2 VIEW  Comparison: 06/04/2011.  Findings: Trachea is midline.  Heart size normal.  Biapical pleural thickening.  Lungs are otherwise clear.  No pleural  fluid.  IMPRESSION: No acute findings.  Original Report Authenticated By: Reyes Ivan, M.D.   Dg Chest 2 View  06/04/2011  *RADIOLOGY REPORT*  Clinical Data: Chest pain for 2 days.  CHEST - 2 VIEW  Comparison: Chest radiograph performed 11/27/2010  Findings: The lungs are well-aerated and clear.  There is no evidence of focal opacification, pleural effusion or pneumothorax.  The heart is normal in size; the mediastinal contour is within normal limits.  No acute osseous abnormalities are seen.  IMPRESSION: No acute cardiopulmonary process seen.  Original Report Authenticated By: Tonia Ghent, M.D.   US Abdomen Complete  06/27/2011  *RADIOLOGY REPORT*  Clinical Data:  Abdominal pain  ULTRASOUND ABDOMEN:  Technique:  Sonography of upper abdominal structures was performed.  Comparison:  None  Gallbladder:  Normally distended without stones or wall thickening. No pericholecystic fluid or sonographic Murphy sign.  Common bile duct:  Normal caliber 4 mm diameter  Liver:  Echogenic lesion within right lobe 2.1 x 1.8 x 2.4 cm, imaging characteristics suggesting hepatic hemangioma.  No additional focal hepatic abnormalities.  IVC:  Normal appearance  Pancreas:  Obscured by bowel gas  Spleen:  Normal appearance, 11.9 cm length  Right kidney:  12.3 cm length. Normal morphology without mass or hydronephrosis.  Left kidney:  11.7 cm length. Normal morphology without mass or hydronephrosis.  Aorta:  Normal caliber  Other:  No free fluid  IMPRESSION: Nonvisualization of pancreas. Echogenic 2.1 x 1.8 x 2.4 cm diameter lesion within the right lobe of the liver, potentially representing hepatic hemangioma. In the absence of a history of malignancy, recommend follow-up ultrasound in 6 months to establish stability. If the patient has a history of malignancy, recommend characterization by MR imaging with/without contrast.  Original Report Authenticated By: Lollie Marrow, M.D.    Patient with a gastric abdominal pain on  exam and chest wall pain on exam. Dilaudid here with pain relief. She's not appear to have cardiac component to this. EKG is normal with normal troponin and normal d-dimer. She does have a lesion of the right lobe of the liver which will require followup and I doubt is causing her pain today. She will be referred for close followup for this.         Hilario Quarry, MD 06/27/11 [phone]

## 2011-06-27 NOTE — Discharge Instructions (Signed)
Your pain today appears to come from the abdomen. He did not have any gallstones on your ultrasound. You do have cysts on the liver which requires followup. You have been referred to Dr. Matthias Hughs for followup.  Please also establish primary care to have the medications refilled and your routine labs evaluated.

## 2011-06-27 NOTE — ED Notes (Signed)
Pt states while moving chairs began having chest pain lt side states that she feels hot, restless in triage not able to sit still done 1 ekg given to md ray. Was at [facility] 3 wks ago for the same thing

## 2011-07-02 ENCOUNTER — Encounter (HOSPITAL_COMMUNITY): Payer: Self-pay

## 2011-07-02 ENCOUNTER — Ambulatory Visit (HOSPITAL_COMMUNITY)
Admission: RE | Admit: 2011-07-02 | Discharge: 2011-07-02 | Disposition: A | Discharge status: Home / Self Care | Payer: Self-pay | Source: Ambulatory Visit | Attending: Gastroenterology | Admitting: Gastroenterology

## 2011-07-02 HISTORY — DX: Shortness of breath: R06.02

## 2011-07-02 HISTORY — DX: Gastro-esophageal reflux disease without esophagitis: K21.9

## 2011-07-02 NOTE — Patient Instructions (Signed)
Esophagogastroduodenoscopy This is an endoscopic procedure (a procedure that uses a device like a flexible telescope) that allows your caregiver to view the upper stomach and small bowel. This test allows your caregiver to look at the esophagus. The esophagus carries food from your mouth to your stomach. They can also look at your duodenum. This is the first part of the small intestine that attaches to the stomach. This test is used to detect problems in the bowel such as ulcers and inflammation. PREPARATION FOR TEST Nothing to eat after midnight the day before the test. NORMAL FINDINGS Normal esophagus, stomach, and duodenum. Ranges for normal findings may vary among different laboratories and hospitals. You should always check with your doctor after having lab work or other tests done to discuss the meaning of your test results and whether your values are considered within normal limits. MEANING OF TEST  Your caregiver will go over the test results with you and discuss the importance and meaning of your results, as well as treatment options and the need for additional tests if necessary. OBTAINING THE TEST RESULTS It is your responsibility to obtain your test results. Ask the lab or department performing the test when and how you will get your results. Document Released: 07/26/2004 Document Revised: 03/14/2011 Document Reviewed: 03/04/2008 [facility] Patient Information 2012 [place], [place].

## 2011-07-02 NOTE — Anesthesia Preprocedure Evaluation (Addendum)
Anesthesia Evaluation  Patient identified by MRN, date of birth, ID band Patient awake    Reviewed: Allergy & Precautions, H&P , NPO status , Patient's Chart, lab work & pertinent test results  Airway Mallampati: II TM Distance: >3 FB Neck ROM: Full    Dental   Pulmonary shortness of breath, Current Smoker,  breath sounds clear to auscultation  Pulmonary exam normal       Cardiovascular negative cardio ROS  Rhythm:Regular Rate:Normal     Neuro/Psych PSYCHIATRIC DISORDERS Depression negative neurological ROS     GI/Hepatic Neg liver ROS, GERD-  Medicated,  Endo/Other  negative endocrine ROS  Renal/GU negative Renal ROS  negative genitourinary   Musculoskeletal negative musculoskeletal ROS (+)   Abdominal   Peds negative pediatric ROS (+)  Hematology negative hematology ROS (+)   Anesthesia Other Findings   Reproductive/Obstetrics negative OB ROS                           Anesthesia Physical Anesthesia Plan  ASA: II  Anesthesia Plan: MAC   Post-op Pain Management:    Induction:   Airway Management Planned: Nasal Cannula  Additional Equipment:   Intra-op Plan:   Post-operative Plan:   Informed Consent:   Plan Discussed with:   Anesthesia Plan Comments: (Some language barrier, but she seems to understand most [place]. Questions answered.)       Anesthesia Quick Evaluation

## 2011-07-03 ENCOUNTER — Encounter (HOSPITAL_COMMUNITY)
Admission: RE | Disposition: A | Discharge status: Home / Self Care | Payer: Self-pay | Source: Ambulatory Visit | Attending: Gastroenterology

## 2011-07-03 ENCOUNTER — Ambulatory Visit (HOSPITAL_COMMUNITY): Payer: Self-pay | Admitting: Anesthesiology

## 2011-07-03 ENCOUNTER — Ambulatory Visit (HOSPITAL_COMMUNITY)
Admission: RE | Admit: 2011-07-03 | Discharge: 2011-07-03 | Disposition: A | Discharge status: Home / Self Care | Payer: Self-pay | Source: Ambulatory Visit | Attending: Gastroenterology | Admitting: Gastroenterology

## 2011-07-03 ENCOUNTER — Encounter (HOSPITAL_COMMUNITY): Payer: Self-pay | Admitting: Anesthesiology

## 2011-07-03 ENCOUNTER — Encounter (HOSPITAL_COMMUNITY): Payer: Self-pay

## 2011-07-03 DIAGNOSIS — K2940 Chronic atrophic gastritis without bleeding: Secondary | ICD-10-CM | POA: Insufficient documentation

## 2011-07-03 SURGERY — ESOPHAGOGASTRODUODENOSCOPY (EGD) WITH PROPOFOL
Anesthesia: Monitor Anesthesia Care

## 2011-07-03 MED ORDER — FENTANYL CITRATE 0.05 MG/ML IJ SOLN
INTRAMUSCULAR | Status: DC | PRN
  Administered 2011-07-03: 50 ug via INTRAVENOUS

## 2011-07-03 MED ORDER — MIDAZOLAM HCL 5 MG/5ML IJ SOLN
INTRAMUSCULAR | Status: DC | PRN
  Administered 2011-07-03: 2 mg via INTRAVENOUS

## 2011-07-03 MED ORDER — BUTAMBEN-TETRACAINE-BENZOCAINE 2-2-14 % EX AERO
INHALATION_SPRAY | CUTANEOUS | Status: DC | PRN
  Administered 2011-07-03: 2 via TOPICAL

## 2011-07-03 MED ORDER — KETAMINE HCL 10 MG/ML IJ SOLN
INTRAMUSCULAR | Status: DC | PRN
  Administered 2011-07-03: 20 mg via INTRAVENOUS

## 2011-07-03 MED ORDER — LACTATED RINGERS IV SOLN
INTRAVENOUS | Status: DC
  Administered 2011-07-03: 1000 mL via INTRAVENOUS

## 2011-07-03 MED ORDER — LACTATED RINGERS IV SOLN
INTRAVENOUS | Status: DC | PRN
  Administered 2011-07-03: 07:00:00 via INTRAVENOUS

## 2011-07-03 MED ORDER — PROPOFOL 10 MG/ML IV EMUL
INTRAVENOUS | Status: DC | PRN
  Administered 2011-07-03: 100 ug/kg/min via INTRAVENOUS

## 2011-07-03 SURGICAL SUPPLY — 14 items

## 2011-07-03 NOTE — Anesthesia Postprocedure Evaluation (Signed)
  Anesthesia Post-op Note  Patient: Lindsey Massey  Procedure(s) Performed: Procedure(s) (LRB): ESOPHAGOGASTRODUODENOSCOPY (EGD) WITH PROPOFOL (N/A)  Patient Location: PACU  Anesthesia Type: MAC  Level of Consciousness: oriented and sedated  Airway and Oxygen Therapy: Patient Spontanous Breathing  Post-op Pain: none  Post-op Assessment: Post-op Vital signs reviewed, Patient's Cardiovascular Status Stable, Respiratory Function Stable and Patent Airway  Post-op Vital Signs: stable  Complications: No apparent anesthesia complications

## 2011-07-03 NOTE — Op Note (Signed)
[facility] [address] [place], [place]  [address]  ENDOSCOPY PROCEDURE REPORT  PATIENT:  Lindsey Massey, Lindsey Massey  MR#:  [id number] BIRTHDATE:  1960/06/17, 51 yrs. old  GENDER:  female  ENDOSCOPIST:  Willis Modena, MD Referred by:  Tally Joe, M.D.  PROCEDURE DATE:  07/03/2011 PROCEDURE:  EGD with biopsy, 43239 ASA CLASS:  Class II INDICATIONS:  abdominal pain, chest pain, nausea, GERD  MEDICATIONS:   Cetacaine spray x 2, MAC sedation, administered by CRNA  DESCRIPTION OF PROCEDURE:   After the risks benefits and alternatives of the procedure were thoroughly explained, informed consent was obtained.  The  endoscope was introduced through the mouth and advanced to the second portion of the duodenum, without limitations.  The instrument was slowly withdrawn as the mucosa was fully examined. <<PROCEDUREIMAGES>>  FINDINGS:  Normal esophagus.  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  Otherwise normal stomach, pylorus and duodenum to the second portion.  Random biopsies of normal-appearing duodenum were taken to evaluate for celiac sprue.  ENDOSCOPIC IMPRESSION:    1.  Mild antral and body gastritis, biopsied. 2.  Otherwise normal endoscopy; no clear source of patient's symptoms were identifie.  RECOMMENDATIONS:      1.  Watch for potential complications of procedure. 2.  Await biopsy  results. 3.  Continue Nexium. 4.  Follow-up in office in 3-4 weeks.  ______________________________ Willis Modena  n. eSIGNED:   Willis Modena at 07/03/2011 08:00 AM  Clydie Braun, [id number]

## 2011-07-03 NOTE — Transfer of Care (Signed)
Immediate Anesthesia Transfer of Care Note  Patient: Lindsey Massey  Procedure(s) Performed: Procedure(s) (LRB): ESOPHAGOGASTRODUODENOSCOPY (EGD) WITH PROPOFOL (N/A)  Patient Location: PACU and Endoscopy Unit  Anesthesia Type: MAC  Level of Consciousness: awake and alert   Airway & Oxygen Therapy: Patient Spontanous Breathing and Patient connected to nasal cannula oxygen  Post-op Assessment: Report given to PACU RN and Post -op Vital signs reviewed and stable  Post vital signs: Reviewed and stable  Complications: No apparent anesthesia complications

## 2011-07-03 NOTE — H&P (Signed)
Patient interval history reviewed.  Patient examined again.  There has been no change from documented H/P dated 07/01/11 (scanned into chart from our office) except as documented above.  Assessment:  Epigastric and chest discomfort.  Plan:  Endoscopy.  Risks (bleeding, infection, bowel perforation that could require surgery, sedation-related changes in cardiopulmonary systems), benefits (identification and possible treatment of source of symptoms, exclusion of certain causes of symptoms), and alternatives (watchful waiting, radiographic imaging studies, empiric medical treatment) of upper endoscopy (EGD) were explained to patient in detail and she wishes to proceed.

## 2011-07-03 NOTE — Discharge Instructions (Signed)
Endoscopy Care After Please read the instructions outlined below and refer to this sheet in the next few weeks. These discharge instructions provide you with general information on caring for yourself after you leave the hospital. Your doctor may also give you specific instructions. While your treatment has been planned according to the most current medical practices available, unavoidable complications occasionally occur. If you have any problems or questions after discharge, please call your doctor. HOME CARE INSTRUCTIONS Activity  You may resume your regular activity but move at a slower pace for the next 24 hours.   Take frequent rest periods for the next 24 hours.   Walking will help expel (get rid of) the air and reduce the bloated feeling in your abdomen.   No driving for 24 hours (because of the anesthesia (medicine) used during the test).   You may shower.   Do not sign any important legal documents or operate any machinery for 24 hours (because of the anesthesia used during the test).  Nutrition  Drink plenty of fluids.   You may resume your normal diet.   Begin with a light meal and progress to your normal diet.   Avoid alcoholic beverages for 24 hours or as instructed by your caregiver.  Medications You may resume your normal medications unless your caregiver tells you otherwise. What you can expect today  You may experience abdominal discomfort such as a feeling of fullness or "gas" pains.   You may experience a sore throat for 2 to 3 days. This is normal. Gargling with salt water may help this.  Follow-up Your doctor will discuss the results of your test with you. SEEK IMMEDIATE MEDICAL CARE IF:  You have excessive nausea (feeling sick to your stomach) and/or vomiting.   You have severe abdominal pain and distention (swelling).   You have trouble swallowing.   You have a temperature over 100 F (37.8 C).   You have rectal bleeding or vomiting of blood.

## 2011-07-19 ENCOUNTER — Ambulatory Visit: Payer: Self-pay | Admitting: Family Medicine

## 2011-09-13 ENCOUNTER — Ambulatory Visit (INDEPENDENT_AMBULATORY_CARE_PROVIDER_SITE_OTHER): Payer: Self-pay | Admitting: Family Medicine

## 2011-09-13 ENCOUNTER — Encounter: Payer: Self-pay | Admitting: Family Medicine

## 2011-09-13 VITALS — BP 114/76 | HR 91 | Temp 97.6°F | Wt 166.0 lb

## 2011-09-13 DIAGNOSIS — S025XXA Fracture of tooth (traumatic), initial encounter for closed fracture: Secondary | ICD-10-CM | POA: Insufficient documentation

## 2011-09-13 MED ORDER — TRAMADOL HCL 50 MG PO TABS: 50.0000 mg | ORAL_TABLET | Freq: Three times a day (TID) | ORAL | Status: AC | PRN

## 2011-09-13 NOTE — Assessment & Plan Note (Signed)
Broken tooth with large filling that is also broken. No sign of infection.  Gave tramadol and list of dentists to call.

## 2011-09-13 NOTE — Patient Instructions (Signed)
Please call around to see who will let you set up a payment plan I have given you a list of dentist that take medicaid, which might be a good place to start  You can take tylenol with the tramadol for pain  Please make an appt with Dr. Mikel Cella to follow up on your medical problems

## 2011-09-13 NOTE — Progress Notes (Signed)
Subjective:    Patient ID: Lindsey Massey, female    DOB: [birth year], 51 y.o.   MRN: [id number]  HPI Pt with 4 days of pain in tooth due to biting on a nut and breaking her tooth with a filling.  No fevers, no drainage.  Has not seen a dentist recently.  Has a lot of pain and is having some trouble eating.   Review of Systems See above     Objective:   Physical Exam Vital signs reviewed General appearance - alert, well appearing, and in no distress Mouth- bottom left molar is broken.  No redness of gums, no abcess, no drainage.         Assessment & Plan:

## 2011-09-16 ENCOUNTER — Ambulatory Visit (INDEPENDENT_AMBULATORY_CARE_PROVIDER_SITE_OTHER): Payer: Self-pay | Admitting: Family Medicine

## 2011-09-16 ENCOUNTER — Telehealth: Payer: Self-pay | Admitting: Family Medicine

## 2011-09-16 ENCOUNTER — Other Ambulatory Visit: Payer: Self-pay

## 2011-09-16 ENCOUNTER — Encounter: Payer: Self-pay | Admitting: Family Medicine

## 2011-09-16 VITALS — BP 90/68 | HR 92 | Temp 98.2°F | Ht 65.6 in | Wt 162.0 lb

## 2011-09-16 DIAGNOSIS — E785 Hyperlipidemia, unspecified: Secondary | ICD-10-CM

## 2011-09-16 DIAGNOSIS — M810 Age-related osteoporosis without current pathological fracture: Secondary | ICD-10-CM

## 2011-09-16 DIAGNOSIS — K219 Gastro-esophageal reflux disease without esophagitis: Secondary | ICD-10-CM

## 2011-09-16 DIAGNOSIS — Z1239 Encounter for other screening for malignant neoplasm of breast: Secondary | ICD-10-CM

## 2011-09-16 DIAGNOSIS — R319 Hematuria, unspecified: Secondary | ICD-10-CM

## 2011-09-16 DIAGNOSIS — Z1231 Encounter for screening mammogram for malignant neoplasm of breast: Secondary | ICD-10-CM

## 2011-09-16 LAB — POCT URINALYSIS DIPSTICK
Glucose, UA: NEGATIVE
Ketones, UA: NEGATIVE
Spec Grav, UA: 1.02
Urobilinogen, UA: 0.2

## 2011-09-16 LAB — POCT UA - MICROSCOPIC ONLY

## 2011-09-16 MED ORDER — ROSUVASTATIN CALCIUM 10 MG PO TABS: 10.0000 mg | ORAL_TABLET | Freq: Every day | ORAL | Status: DC

## 2011-09-16 MED ORDER — SERTRALINE HCL 50 MG PO TABS: 50.0000 mg | ORAL_TABLET | Freq: Every day | ORAL | Status: DC

## 2011-09-16 MED ORDER — MIRTAZAPINE 15 MG PO TABS: 15.0000 mg | ORAL_TABLET | Freq: Every day | ORAL | Status: DC

## 2011-09-16 MED ORDER — ESOMEPRAZOLE MAGNESIUM 20 MG PO CPDR: 40.0000 mg | DELAYED_RELEASE_CAPSULE | Freq: Every day | ORAL | Status: DC

## 2011-09-16 NOTE — Progress Notes (Signed)
Addended by: [place], Solei Wubben on: 09/16/2011 03:37 PM   Modules accepted: Orders

## 2011-09-16 NOTE — Patient Instructions (Addendum)
It was great to see you today!  Schedule an appointment to see me as needed.  We will check your labs today.  I think your symptoms are all related to acid reflux. I have refilled your medications.  If your symptoms persist after you treat your reflux come back and see me.  I have put in a mammogram and a bone scan to check for osteoporosis

## 2011-09-16 NOTE — Progress Notes (Signed)
Subjective:   Patient ID: Lindsey Massey, female DOB: 1960/12/20 51 y.o. MRN: [id number] HPI:  1. Epigastric pain with emesis. Course: unchanged Synopsis: patient has 4 days of feeling fatigued associated with headache, epigastric pain, acid reflux, and emesis x 3 yesterday. She is still eating and using the bathroom normally. She has been out of her medications for over a week including her nexium. She has a normal EGD revied in the chart from march. She has no fever, chills or night sweats. No sick contacts. No chest pain. She has some shortness of breath.  Location: epigastric Onset: has been acute  Time period of: 4 day(s).  Severity is described as moderate.  Aggravating: lying down Alleviating: nexium Associated sx/sn: dark urine, red color in urine.   2. HLD Course: unchanged Synopsis: hx of, on crestor 10 mg. Fasting today  3. Screening for breast ca Patient will be scheduled for mammogram. Denies lumps or breast pain. Denies nipple discharge.   4. Osteoporosis Synopsis: history of, was being treated in [place] with alendronate. No fractures recently.   History  Substance Use Topics  . Smoking status: Current Everyday Smoker -- 0.3 packs/day    Types: Cigarettes  . Smokeless tobacco: Not on file  . Alcohol Use: No    Review of Systems: Pertinent items are noted in HPI.  Labs Reviewed: yes Reviewed Chart Review for last notes.     Objective:   Filed Vitals:   09/16/11 1339  BP: 90/68  Pulse: 92  Temp: 98.2 F (36.8 C)  TempSrc: Oral  Height: 5' 5.6" (1.666 m)  Weight: 162 lb (73.483 kg)   Physical Exam: General: arabic female, nad, seems fatigued, smiling, pleasant Lungs:  Normal respiratory effort, chest expands symmetrically. Lungs are clear to auscultation, no crackles or wheezes. Heart - Regular rate and rhythm.  No murmurs, gallops or rubs.    Abd: tender over the right groin, no rebound, no distension, no rigidity. No LA. No scars.  Extremities:     Non-tender, No cyanosis, edema, or deformity noted. BacK: nontender, no CVAT. Face: non-tender, no discharge from nose. Mouth - no lesions, mucous membranes are moist, no decaying teeth  Neck:  No deformities, thyromegaly, masses, or tenderness noted.   Supple with full range of motion without pain. Assessment & Plan:

## 2011-09-16 NOTE — Telephone Encounter (Signed)
Family member is calling because there is supposed to be an order for Bone Scan and Mammogram but [facility] says the order isn't showing.  Also, they would prefer to go to [facility].

## 2011-09-16 NOTE — Telephone Encounter (Signed)
Forwarded to Dr. Orton.Alailah Safley Lynetta  

## 2011-09-16 NOTE — Assessment & Plan Note (Signed)
Ordered mammogram.

## 2011-09-16 NOTE — Assessment & Plan Note (Signed)
Refilled nexium. 4 days of GERD symptoms.

## 2011-09-16 NOTE — Assessment & Plan Note (Signed)
Refilled meds. Patient fasting. Will get lipid panel.

## 2011-09-16 NOTE — Assessment & Plan Note (Signed)
C/o dark urine with some redness associated with rlq pain and vomiting.

## 2011-09-17 LAB — LIPID PANEL
Cholesterol: 206 mg/dL — ABNORMAL HIGH (ref 0–200)
HDL: 55 mg/dL (ref >39)
LDL Cholesterol: 132 mg/dL — ABNORMAL HIGH (ref 0–99)
Total CHOL/HDL Ratio: 3.7 Ratio
Triglycerides: 97 mg/dL (ref <150)
VLDL: 19 mg/dL (ref 0–40)

## 2011-10-16 ENCOUNTER — Ambulatory Visit (HOSPITAL_COMMUNITY)
Admission: RE | Admit: 2011-10-16 | Discharge: 2011-10-16 | Disposition: A | Discharge status: Home / Self Care | Payer: Self-pay | Source: Ambulatory Visit | Attending: Family Medicine | Admitting: Family Medicine

## 2011-10-16 DIAGNOSIS — M810 Age-related osteoporosis without current pathological fracture: Secondary | ICD-10-CM

## 2011-10-16 DIAGNOSIS — Z1231 Encounter for screening mammogram for malignant neoplasm of breast: Secondary | ICD-10-CM | POA: Insufficient documentation

## 2011-10-16 DIAGNOSIS — Z780 Asymptomatic menopausal state: Secondary | ICD-10-CM | POA: Insufficient documentation

## 2011-10-16 DIAGNOSIS — Z13820 Encounter for screening for osteoporosis: Secondary | ICD-10-CM | POA: Insufficient documentation

## 2011-11-04 ENCOUNTER — Encounter: Payer: Self-pay | Admitting: Family Medicine

## 2011-11-04 ENCOUNTER — Ambulatory Visit (INDEPENDENT_AMBULATORY_CARE_PROVIDER_SITE_OTHER): Payer: Self-pay | Admitting: Family Medicine

## 2011-11-04 VITALS — BP 114/81 | HR 71 | Temp 97.9°F | Ht 65.6 in | Wt 162.0 lb

## 2011-11-04 DIAGNOSIS — J029 Acute pharyngitis, unspecified: Secondary | ICD-10-CM

## 2011-11-04 DIAGNOSIS — R413 Other amnesia: Secondary | ICD-10-CM

## 2011-11-04 DIAGNOSIS — R51 Headache: Secondary | ICD-10-CM

## 2011-11-04 DIAGNOSIS — R6889 Other general symptoms and signs: Secondary | ICD-10-CM

## 2011-11-04 DIAGNOSIS — H9209 Otalgia, unspecified ear: Secondary | ICD-10-CM

## 2011-11-04 DIAGNOSIS — H9202 Otalgia, left ear: Secondary | ICD-10-CM

## 2011-11-04 LAB — POCT RAPID STREP A (OFFICE): Rapid Strep A Screen: NEGATIVE

## 2011-11-04 NOTE — Assessment & Plan Note (Signed)
With sore throat and weakness consistent with viral URI.  Rapid strep today negative.  She has had symptoms for 2 weeks, however. Prolonged symptoms may be due to her smoking. No fevers or other systemic signs.  -Advised smoking cessation at least while she is recovering -Tylenol as needed for pain  -Stay hydrated. She appears dehydrated today.  -Follow-up in 1-2 weeks or if symptoms worsen.

## 2011-11-04 NOTE — Patient Instructions (Addendum)
Drink 8-10 glasses of water or Gatorade.  Take ibuprofen 800 mg every 8 hours as needed for pain/fever/discomfort.  Avoid smoking.   Make a follow-up appointment with Dr. Mikel Cella in 1-2 weeks to see how you are doing and to discuss your imaging results.

## 2011-11-04 NOTE — Progress Notes (Signed)
Subjective:    Patient ID: Lindsey Massey, female    DOB: [birth year], 51 y.o.   MRN: [id number]  HPI # Work-in: left ear pain, feeling weak, sore throat, SOB For the past 2 weeks Symptoms unchanged She is not taking any medication No sick contacts She smokes 5-6 cigarettes a day She drinks a few glasses of water/liquids a daybv   ROS: denies fever, nausea/decreased appetite (she did have 1 episode of vomiting in the past 2 weeks), constipation/diarrhea  # She would like to know imaging and lab results  Review of Systems Per HPI  Allergies, medication, past medical history reviewed.  Significant for:  -Depression -HLD -Osteoporosis -Perforated right tympanic membrane Dx'd 06/2011    Objective:   Physical Exam GEN: appears uncomfortable; well-nourished PSYCH: flat affect, not anxious appearing HEENT: no sinus tenderness; normal conjunctiva; no nasal congestion; oropharynx erythematous; no tonsillar adenopathy; prominent bilateral maxillary lymph nodes; TM clear bilaterally; no ear or canal tenderness; dry MM CV: RRR PULM: NI WOB; CTAB without wheezes/rales ABD: soft, NT, obese EXT: no edema    Assessment & Plan:  Given copy of lipid, mammogram, and DEXA scan results. Advised to make an appointment with PCP in next 1-2 weeks to discuss results.

## 2011-11-05 ENCOUNTER — Ambulatory Visit: Payer: Self-pay | Admitting: Family Medicine

## 2011-11-13 ENCOUNTER — Ambulatory Visit: Payer: Self-pay | Admitting: Family Medicine

## 2011-11-14 ENCOUNTER — Ambulatory Visit: Payer: Self-pay

## 2011-11-18 ENCOUNTER — Ambulatory Visit: Payer: Self-pay | Admitting: Family Medicine

## 2011-11-25 ENCOUNTER — Other Ambulatory Visit: Payer: Self-pay | Admitting: Family Medicine

## 2011-11-25 MED ORDER — FLUOXETINE HCL 20 MG PO CAPS: 20.0000 mg | ORAL_CAPSULE | Freq: Every day | ORAL | Status: DC

## 2012-01-24 ENCOUNTER — Ambulatory Visit (INDEPENDENT_AMBULATORY_CARE_PROVIDER_SITE_OTHER): Payer: Self-pay | Admitting: Family Medicine

## 2012-01-24 ENCOUNTER — Encounter: Payer: Self-pay | Admitting: Family Medicine

## 2012-01-24 VITALS — BP 133/82 | HR 87 | Temp 98.1°F | Ht 65.6 in | Wt 172.0 lb

## 2012-01-24 DIAGNOSIS — E785 Hyperlipidemia, unspecified: Secondary | ICD-10-CM

## 2012-01-24 DIAGNOSIS — R519 Headache, unspecified: Secondary | ICD-10-CM | POA: Insufficient documentation

## 2012-01-24 DIAGNOSIS — M810 Age-related osteoporosis without current pathological fracture: Secondary | ICD-10-CM

## 2012-01-24 DIAGNOSIS — Z23 Encounter for immunization: Secondary | ICD-10-CM

## 2012-01-24 DIAGNOSIS — N912 Amenorrhea, unspecified: Secondary | ICD-10-CM

## 2012-01-24 DIAGNOSIS — R51 Headache: Secondary | ICD-10-CM

## 2012-01-24 DIAGNOSIS — R0989 Other specified symptoms and signs involving the circulatory and respiratory systems: Secondary | ICD-10-CM

## 2012-01-24 DIAGNOSIS — F329 Major depressive disorder, single episode, unspecified: Secondary | ICD-10-CM

## 2012-01-24 DIAGNOSIS — R0600 Dyspnea, unspecified: Secondary | ICD-10-CM | POA: Insufficient documentation

## 2012-01-24 LAB — POCT URINE PREGNANCY: Preg Test, Ur: NEGATIVE

## 2012-01-24 MED ORDER — MIRTAZAPINE 15 MG PO TABS: 15.0000 mg | ORAL_TABLET | Freq: Every day | ORAL | Status: DC

## 2012-01-24 MED ORDER — IBANDRONATE SODIUM 150 MG PO TABS: 150.0000 mg | ORAL_TABLET | ORAL | Status: DC

## 2012-01-24 MED ORDER — SIMVASTATIN 10 MG PO TABS: 10.0000 mg | ORAL_TABLET | Freq: Every day | ORAL | Status: DC

## 2012-01-24 MED ORDER — ESOMEPRAZOLE MAGNESIUM 20 MG PO CPDR: 40.0000 mg | DELAYED_RELEASE_CAPSULE | Freq: Every day | ORAL | Status: DC

## 2012-01-24 MED ORDER — SERTRALINE HCL 25 MG PO TABS: 25.0000 mg | ORAL_TABLET | Freq: Every day | ORAL | Status: DC

## 2012-01-24 MED ORDER — TRAMADOL HCL 50 MG PO TABS: 50.0000 mg | ORAL_TABLET | Freq: Three times a day (TID) | ORAL | Status: DC | PRN

## 2012-01-24 NOTE — Patient Instructions (Addendum)
It was good to see you today.  For your headache, please take the Tramadol I prescribed as needed. I will call you with the results from the blood work.  For the shortness of breath, it is best for you to stop smoking. We will follow your breathing closely and consider further lung testing.  I have sent in the prescriptions to the pharmacy as well as given you copies to take with you overseas.  Please come back in 1-2 months, or sooner if needed.  Millie Shorb M. Juanmanuel Marohl, M.D.

## 2012-01-24 NOTE — Assessment & Plan Note (Signed)
Changed medication to start taking Simvastatin. This has been refilled as well. Will check direct LDL today to assess response to Simvastatin.

## 2012-01-24 NOTE — Assessment & Plan Note (Signed)
Lungs CTAB and good effort on exam. I think this is associated with her extensive smoking history and perhaps early COPD. No wheezing but does have cough. Encouraged cessation. No inhalers or abx today. If continues or worsen, will refer for PFT.

## 2012-01-24 NOTE — Assessment & Plan Note (Signed)
Will restart Zoloft 25mg . No SI/HI. RTC in 1 month for follow up

## 2012-01-24 NOTE — Progress Notes (Signed)
Patient ID: Amarah Dome, female   DOB: 01-Aug-1960, 51 y.o.   MRN: [id number] [facility] Amber M. Hairford, MD Phone: [phone]  @PATID @  Subjective: HPI: Patient is a 51 y.o. female presenting to clinic today for follow up. Concerns today are extensive. We discussed her concerns including headaches, menopause vs pregnancy, ear pain, headaches, depression, DOE, medication refills, bone test, HLD, hand pain and travelling out of the country. She decided to focus on the following problems and RTC to discuss other problems  1. Dyspnea - All the time, not associated with exertion. She had this before in the ED and was given oxygen. Never had asthma. Does smoke, 1-2 ppd. Some cough with yellow sputum, no fevers. Does not interfere with with daily life but does feel like she needs to take deep breaths more often. No CP, abd pain. No other URI symptoms  2. Headaches- Associated with pain behind eyes. HA every night, takes something for stress (unsure what it is, OTC medicine starts with B) as well as Tylenol and ASA. Unsure if it is associated with stress. Tylenol does help with pain some. Sometimes garlic helps. Some nausea but not always. Describes headache from behind head, wrapping around to head. Goes away on its own. Only visual change is some floaters in the right eye. She sleeps at night and typically does not have a HA any other time during the day. The pain by her right eye is most concerning for her. She does not vomit, does not have any sensory changes.  3. Depression- Long standing history of depression. Was on Zoloft 5 years ago, and Prozac is on our med list but she states she does not take that. She has had SI in the past, but not now. No HI. Some anhedonia and states she wants to stay home and sleep. She would like to take Zoloft again.   4. HLD - Was on Crestor, but her sister sent her Simvastatin to try since she was having some arm pain. She states it is  working well for her. Last cholesterol panel was in June and LDL was elevated. Would like refill on Simvastatin.  5. Amenorrhea- Patient states she feels like she is pregnant causing her symptoms. She has not had a period since Sept 2012. She does have intercourse, but would like pregnancy test.   Needs [place] to go [place] next [place], not able to provide these.  History Reviewed: everyday smoker. Health Maintenance: No flu shot.  ROS: Please see HPI above.  Objective: Office vital signs reviewed.  Physical Examination:  General: Awake, alert. NAD HEENT: Atraumatic, normocephalic. No CN deficit. Pupils equal and reactive. TTP of right temple. Neck: No masses palpated. No LAD Pulm: CTAB, no wheezes Cardio: RRR, no murmurs appreciated Abdomen:+BS, soft, nontender, nondistended Extremities: No edema Neuro: Grossly intact  Assessment: 51 yo F follow up appointment  Plan: See Problem List and After Visit Summary

## 2012-01-24 NOTE — Assessment & Plan Note (Signed)
Most likely related to depression and anxiety. Given TTP of temple, will check ESR but unlikely temporal arteritis. Will give Tramadol to take PRN for headaches for now. Continue Tylenol. RTC in one month for follow up.

## 2012-01-24 NOTE — Assessment & Plan Note (Signed)
Wants monthly pill. Given Boniva. Will check DEXA soon.

## 2012-01-25 LAB — SEDIMENTATION RATE: Sed Rate: 20 mm/hr (ref 0–22)

## 2012-01-25 LAB — LDL CHOLESTEROL, DIRECT: Direct LDL: 123 mg/dL — ABNORMAL HIGH

## 2012-01-27 ENCOUNTER — Encounter: Payer: Self-pay | Admitting: Family Medicine

## 2013-06-09 IMAGING — CR DG CHEST 2V
2 series · 2 of 2 positions shown · non-contrast
Comparison: 06/04/2011.

CLINICAL DATA: Chest pain and cough.

CHEST - 2 VIEW

[w chest pa]
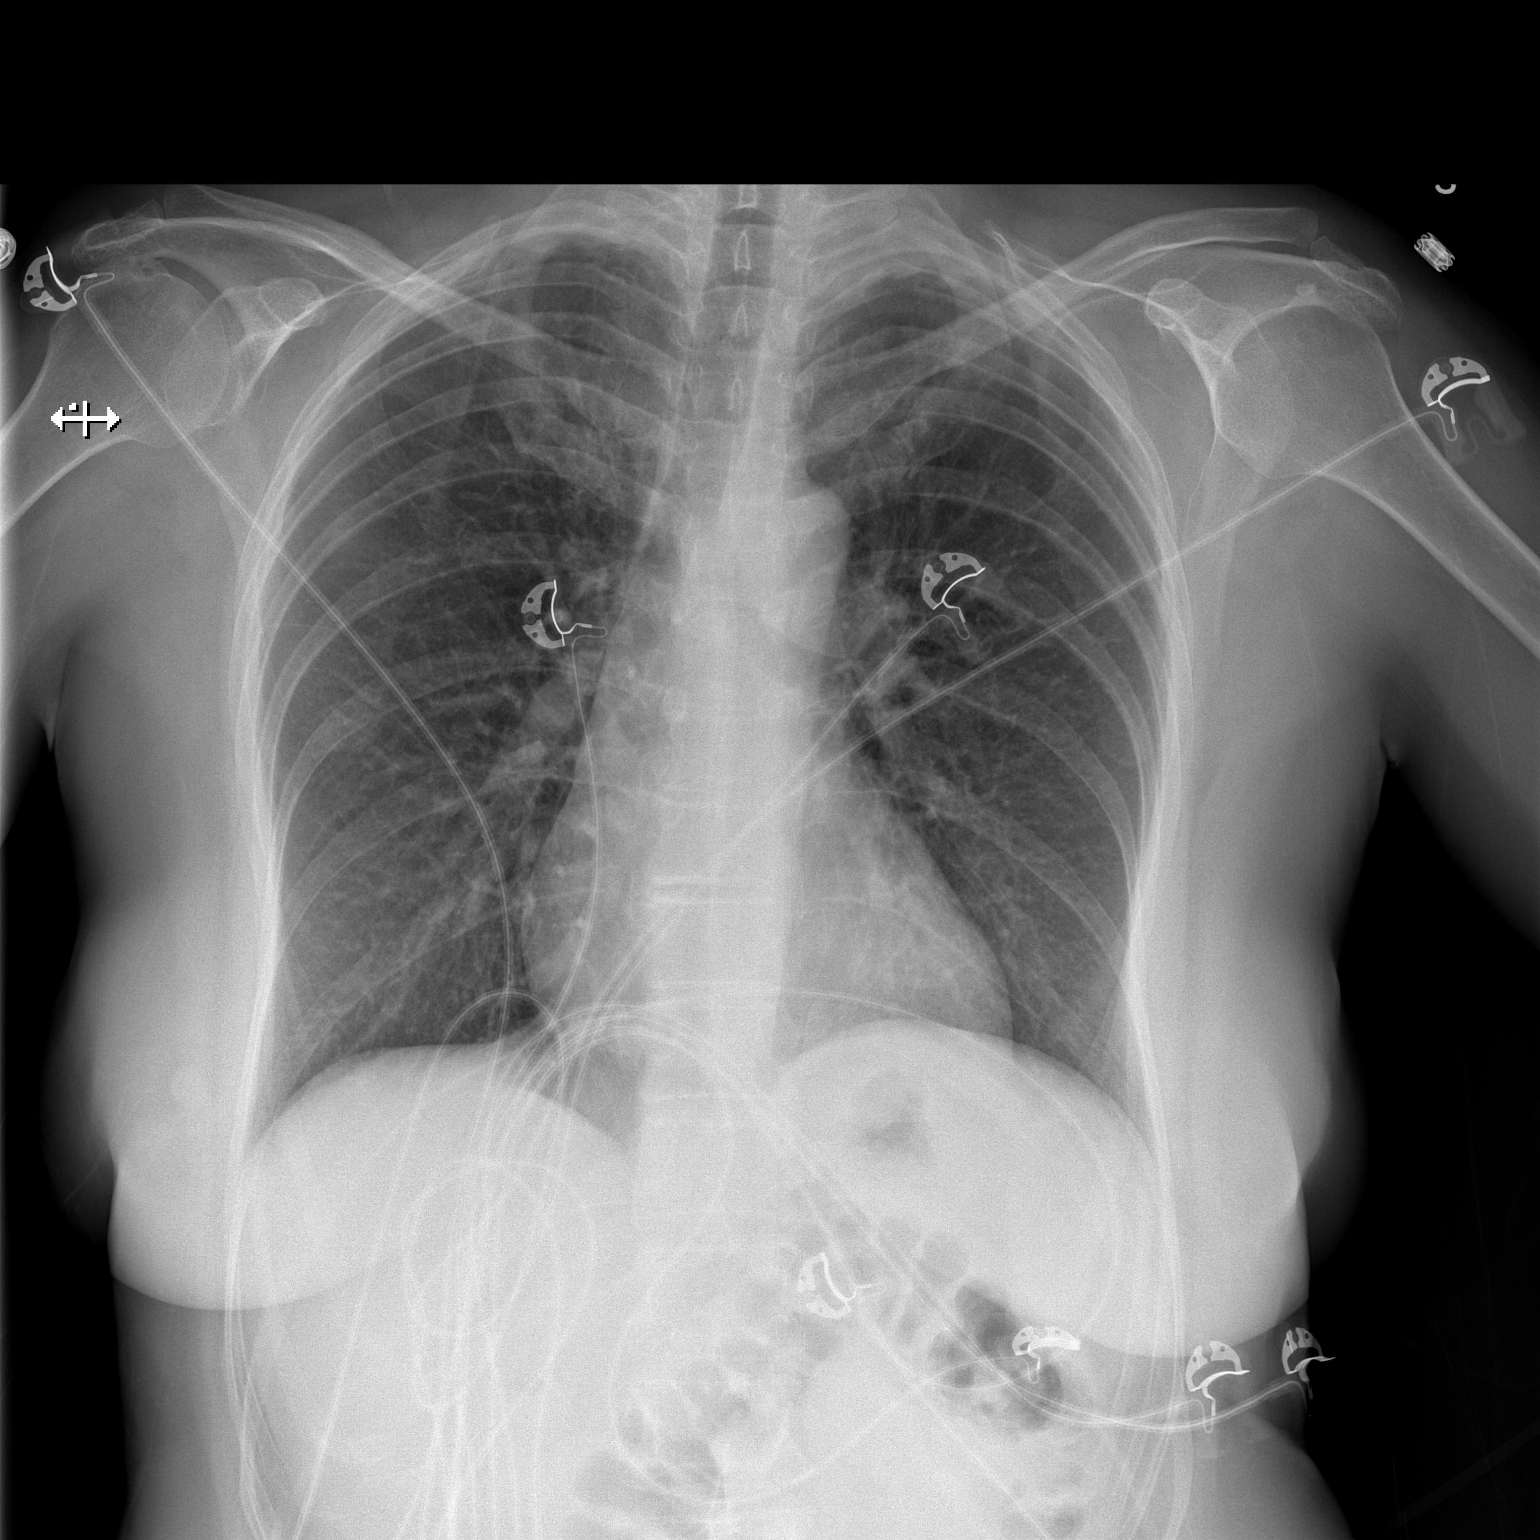

[w chest lat]
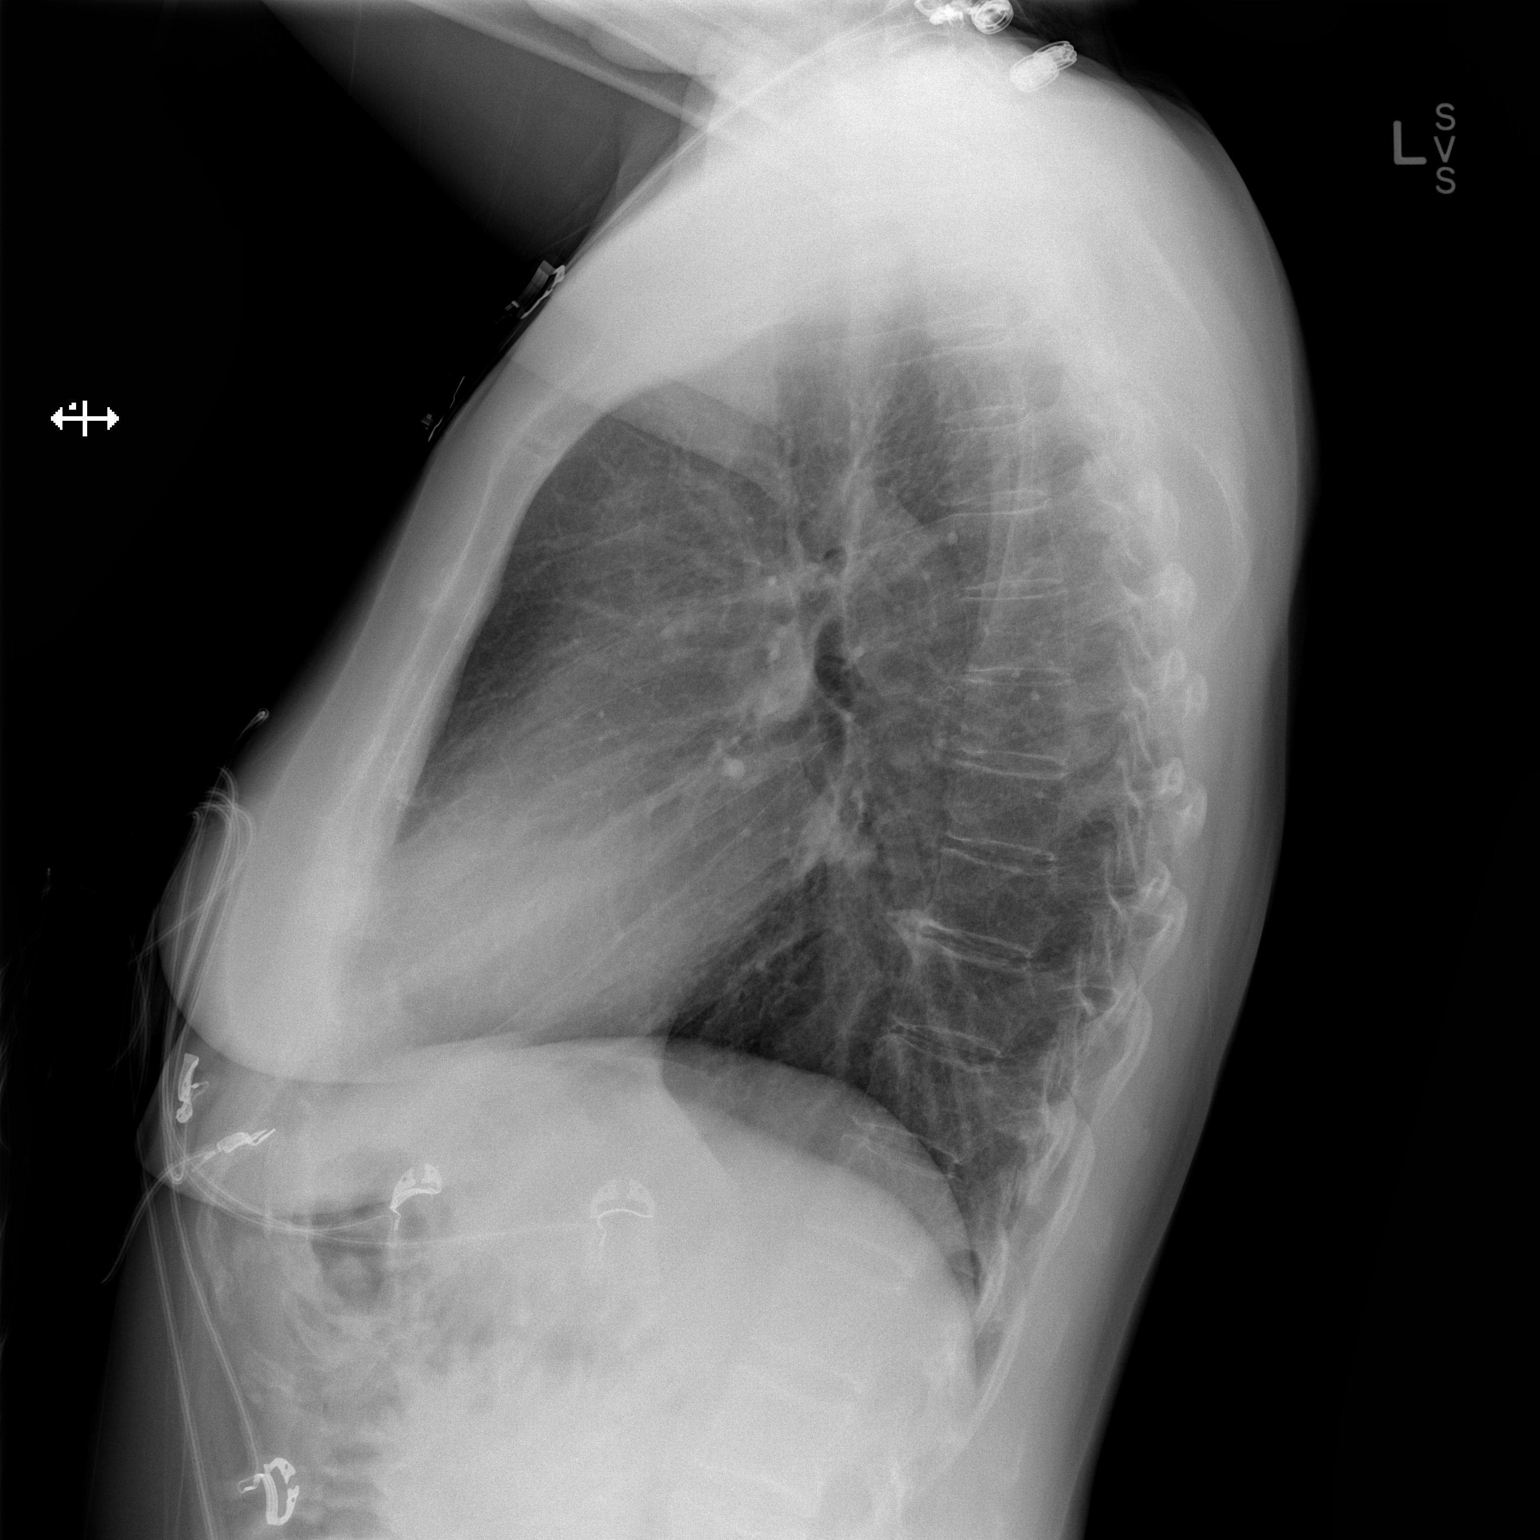

[2 of 2 positions shown; findings below may reference images not displayed]

FINDINGS: Trachea is midline.  Heart size normal.  Biapical pleural
thickening.  Lungs are otherwise clear.  No pleural fluid.
IMPRESSION: No acute findings.

## 2013-06-09 IMAGING — US US ABDOMEN COMPLETE
1 series · 14 of 25 positions shown · non-contrast
Comparison: None

CLINICAL DATA: Abdominal pain

ULTRASOUND ABDOMEN:
TECHNIQUE: Sonography of upper abdominal structures was performed.

[Series 1: us abdomen complete · 0.34mm/px · 14 of 57 slices shown]
[im 1/57]
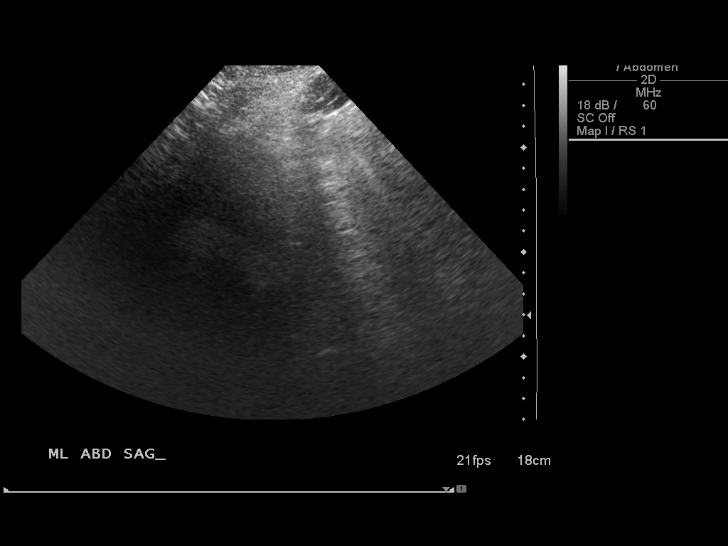
[im 5/57]
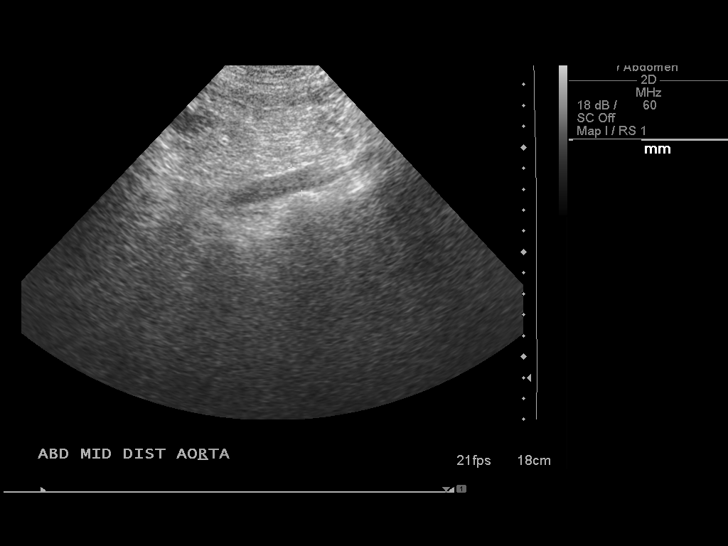
[im 10/57]
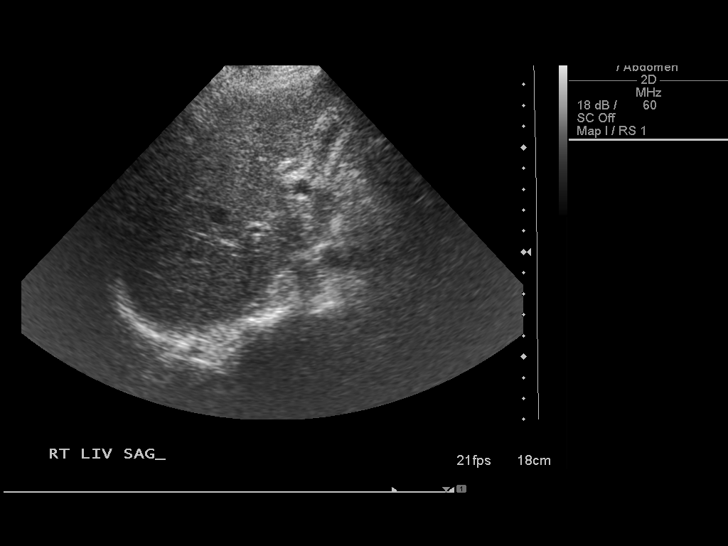
[im 15/57]
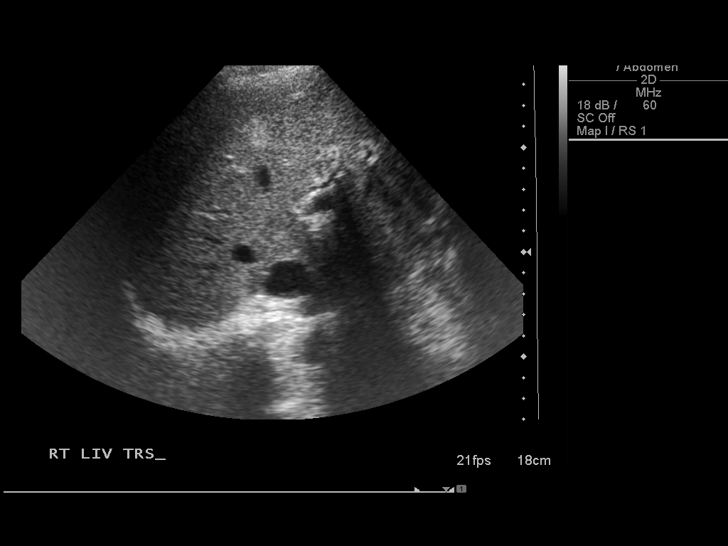
[im 19/57]
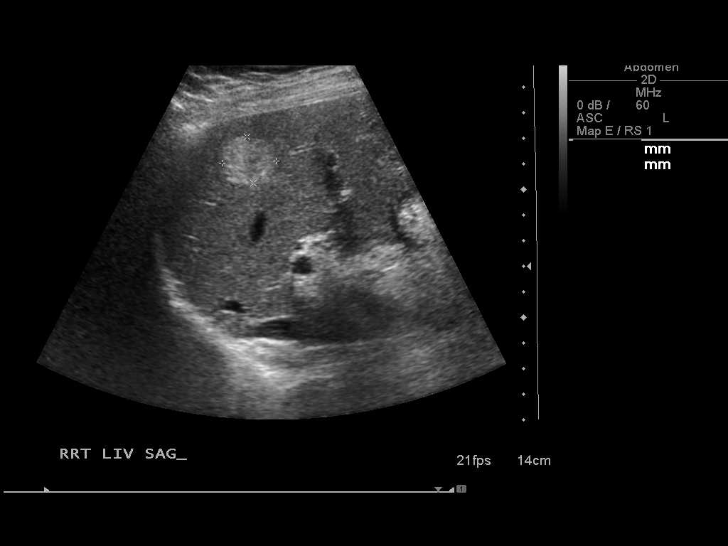
[im 22/57]
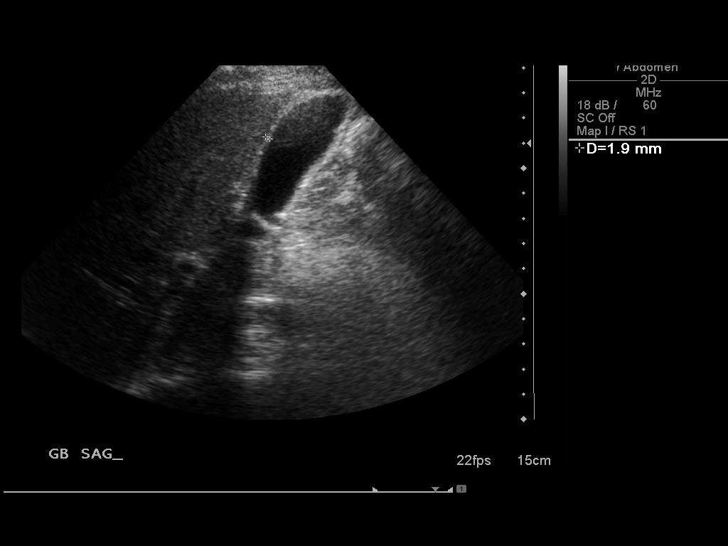
[im 26/57]
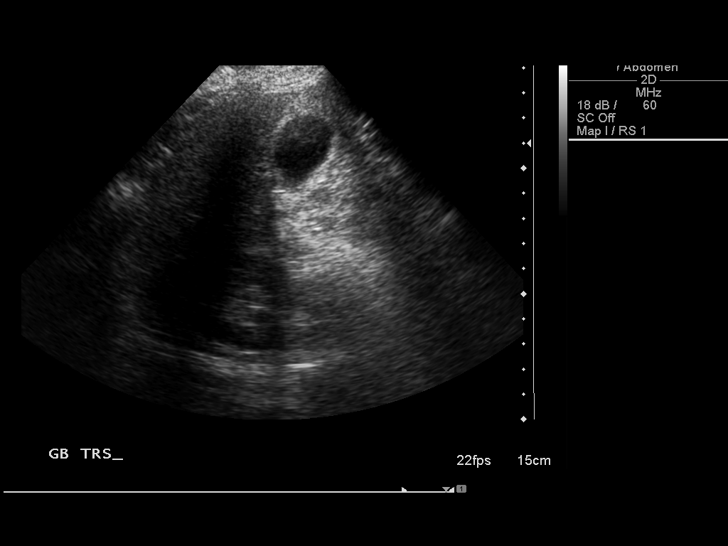
[im 31/57]
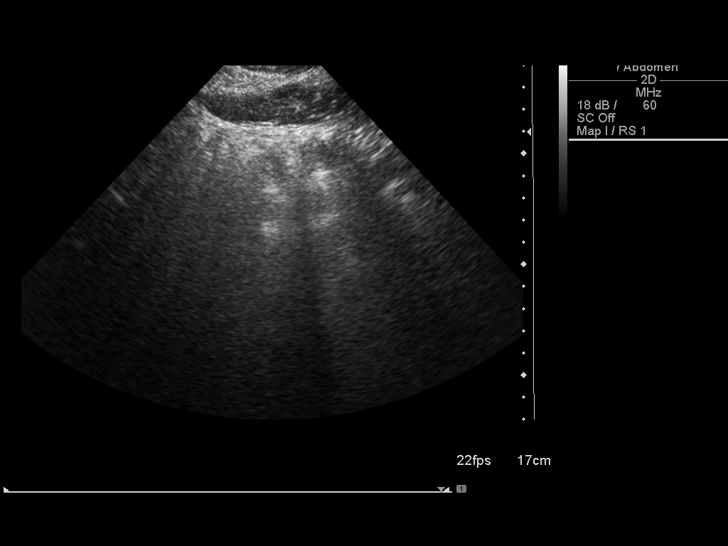
[im 36/57]
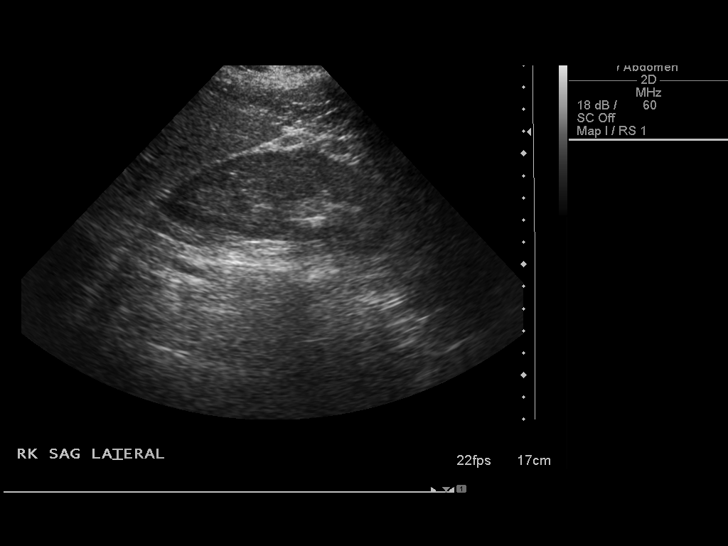
[im 38/57]
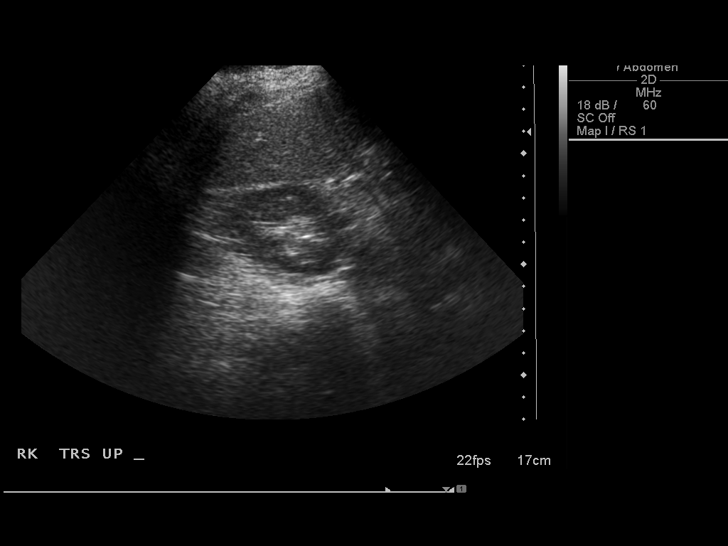
[im 43/57]
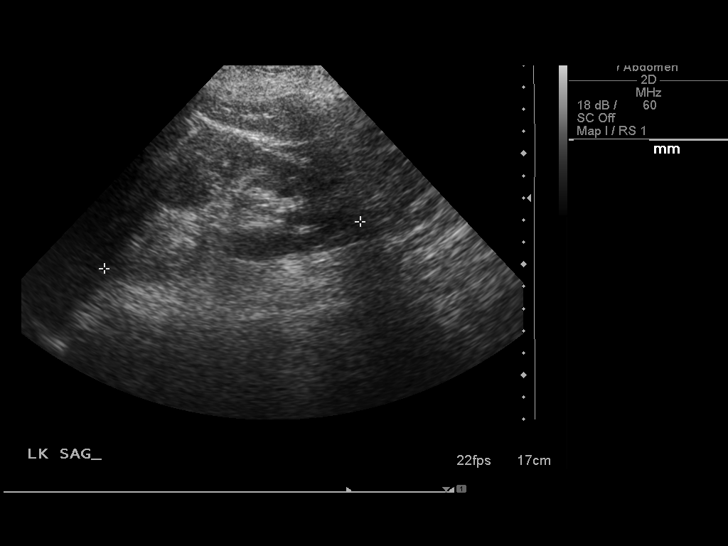
[im 47/57]
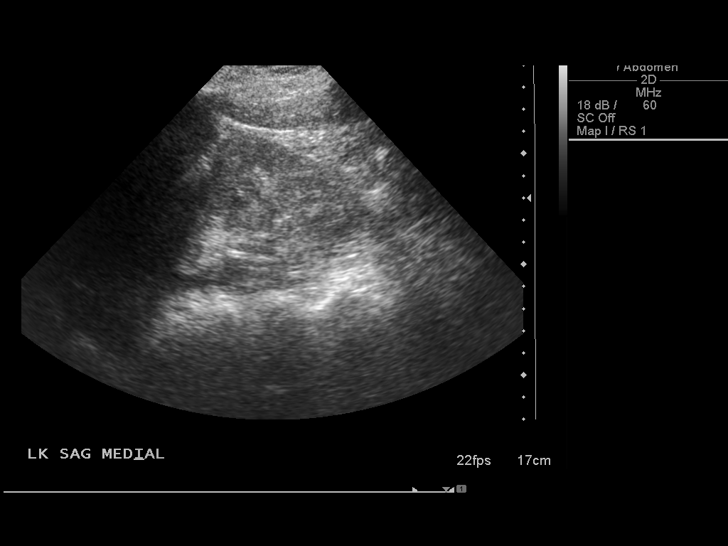
[im 52/57]
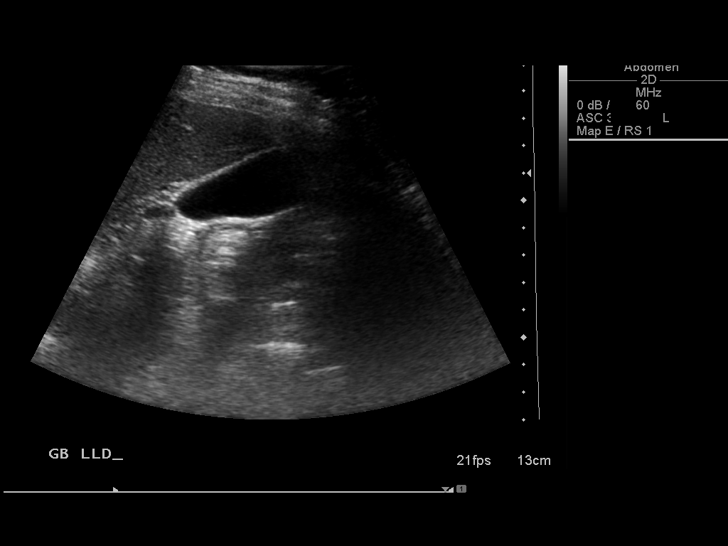
[im 57/57]
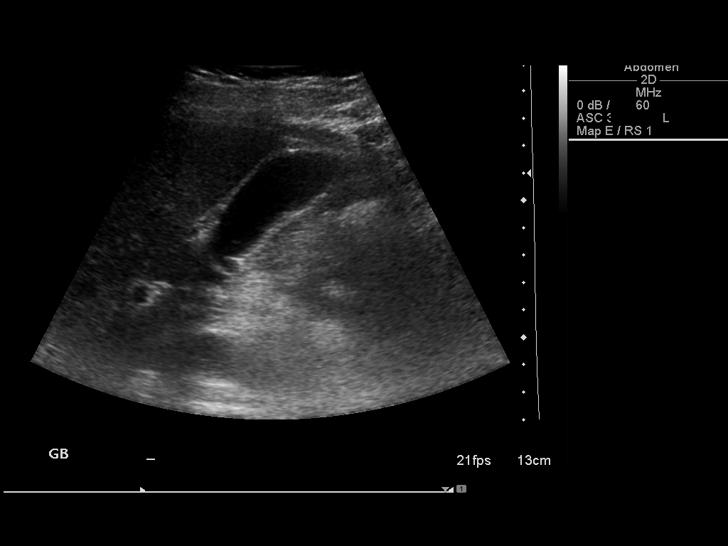

[14 of 25 positions shown; findings below may reference images not displayed]

Gallbladder:  Normally distended without stones or wall thickening.
No pericholecystic fluid or sonographic Murphy sign.

Common bile duct:  Normal caliber 4 mm diameter

Liver:  Echogenic lesion within right lobe 2.1 x 1.8 x 2.4 cm,
imaging characteristics suggesting hepatic hemangioma.  No
additional focal hepatic abnormalities.

IVC:  Normal appearance

Pancreas:  Obscured by bowel gas

Spleen:  Normal appearance, 11.9 cm length

Right kidney:  12.3 cm length. Normal morphology without mass or
hydronephrosis.

Left kidney:  11.7 cm length. Normal morphology without mass or
hydronephrosis.

Aorta:  Normal caliber

Other:  No free fluid
IMPRESSION: Nonvisualization of pancreas.
Echogenic 2.1 x 1.8 x 2.4 cm diameter lesion within the right lobe
of the liver, potentially representing hepatic hemangioma.
In the absence of a history of malignancy, recommend follow-up
ultrasound in 6 months to establish stability.
If the patient has a history of malignancy, recommend
characterization by MR imaging with/without contrast.

## 2014-02-07 ENCOUNTER — Encounter: Payer: Self-pay | Admitting: Family Medicine

## 2014-09-15 ENCOUNTER — Emergency Department (HOSPITAL_COMMUNITY): Payer: Self-pay

## 2014-09-15 ENCOUNTER — Emergency Department (HOSPITAL_COMMUNITY)
Admission: EM | Admit: 2014-09-15 | Discharge: 2014-09-15 | Disposition: A | Discharge status: Home / Self Care | Payer: Self-pay | Attending: Emergency Medicine | Admitting: Emergency Medicine

## 2014-09-15 ENCOUNTER — Encounter (HOSPITAL_COMMUNITY): Payer: Self-pay | Admitting: Emergency Medicine

## 2014-09-15 DIAGNOSIS — F329 Major depressive disorder, single episode, unspecified: Secondary | ICD-10-CM | POA: Insufficient documentation

## 2014-09-15 DIAGNOSIS — K219 Gastro-esophageal reflux disease without esophagitis: Secondary | ICD-10-CM | POA: Insufficient documentation

## 2014-09-15 DIAGNOSIS — R51 Headache: Secondary | ICD-10-CM | POA: Insufficient documentation

## 2014-09-15 DIAGNOSIS — Z720 Tobacco use: Secondary | ICD-10-CM | POA: Insufficient documentation

## 2014-09-15 DIAGNOSIS — Z79899 Other long term (current) drug therapy: Secondary | ICD-10-CM | POA: Insufficient documentation

## 2014-09-15 DIAGNOSIS — N390 Urinary tract infection, site not specified: Secondary | ICD-10-CM | POA: Insufficient documentation

## 2014-09-15 DIAGNOSIS — R109 Unspecified abdominal pain: Secondary | ICD-10-CM

## 2014-09-15 DIAGNOSIS — K5090 Crohn's disease, unspecified, without complications: Secondary | ICD-10-CM | POA: Insufficient documentation

## 2014-09-15 DIAGNOSIS — Z8742 Personal history of other diseases of the female genital tract: Secondary | ICD-10-CM | POA: Insufficient documentation

## 2014-09-15 DIAGNOSIS — R519 Headache, unspecified: Secondary | ICD-10-CM

## 2014-09-15 DIAGNOSIS — E780 Pure hypercholesterolemia: Secondary | ICD-10-CM | POA: Insufficient documentation

## 2014-09-15 DIAGNOSIS — M5412 Radiculopathy, cervical region: Secondary | ICD-10-CM | POA: Insufficient documentation

## 2014-09-15 HISTORY — DX: Pure hypercholesterolemia, unspecified: E78.00

## 2014-09-15 LAB — COMPREHENSIVE METABOLIC PANEL
ALT: 18 U/L (ref 14–54)
AST: 19 U/L (ref 15–41)
Albumin: 3.6 g/dL (ref 3.5–5.0)
Alkaline Phosphatase: 94 U/L (ref 38–126)
Anion gap: 7 (ref 5–15)
BILIRUBIN TOTAL: 0.4 mg/dL (ref 0.3–1.2)
BUN: 8 mg/dL (ref 6–20)
CHLORIDE: 106 mmol/L (ref 101–111)
CO2: 28 mmol/L (ref 22–32)
CREATININE: 0.39 mg/dL — AB (ref 0.44–1.00)
Calcium: 9.2 mg/dL (ref 8.9–10.3)
GFR calc non Af Amer: >60 mL/min (ref >60)
GLUCOSE: 101 mg/dL — AB (ref 65–99)
POTASSIUM: 3.7 mmol/L (ref 3.5–5.1)
SODIUM: 141 mmol/L (ref 135–145)
TOTAL PROTEIN: 6.4 g/dL — AB (ref 6.5–8.1)

## 2014-09-15 LAB — URINE MICROSCOPIC-ADD ON

## 2014-09-15 LAB — URINALYSIS, ROUTINE W REFLEX MICROSCOPIC
Bilirubin Urine: NEGATIVE
Glucose, UA: NEGATIVE mg/dL
Ketones, ur: NEGATIVE mg/dL
NITRITE: NEGATIVE
PROTEIN: NEGATIVE mg/dL
Specific Gravity, Urine: 1.018 (ref 1.005–1.030)
UROBILINOGEN UA: 1 mg/dL (ref 0.0–1.0)
pH: 6.5 (ref 5.0–8.0)

## 2014-09-15 LAB — I-STAT TROPONIN, ED: Troponin i, poc: 0 ng/mL (ref 0.00–0.08)

## 2014-09-15 LAB — CBC WITH DIFFERENTIAL/PLATELET
Basophils Absolute: 0 10*3/uL (ref 0.0–0.1)
Basophils Relative: 0 % (ref 0–1)
Eosinophils Absolute: 0.2 10*3/uL (ref 0.0–0.7)
Eosinophils Relative: 4 % (ref 0–5)
HEMATOCRIT: 41.4 % (ref 36.0–46.0)
HEMOGLOBIN: 13.4 g/dL (ref 12.0–15.0)
Lymphocytes Relative: 25 % (ref 12–46)
Lymphs Abs: 1.3 10*3/uL (ref 0.7–4.0)
MCH: 28.9 pg (ref 26.0–34.0)
MCHC: 32.4 g/dL (ref 30.0–36.0)
MCV: 89.4 fL (ref 78.0–100.0)
Monocytes Absolute: 0.6 10*3/uL (ref 0.1–1.0)
Monocytes Relative: 11 % (ref 3–12)
NEUTROS PCT: 60 % (ref 43–77)
Neutro Abs: 3 10*3/uL (ref 1.7–7.7)
Platelets: 146 10*3/uL — ABNORMAL LOW (ref 150–400)
RBC: 4.63 MIL/uL (ref 3.87–5.11)
RDW: 13.8 % (ref 11.5–15.5)
WBC: 5.1 10*3/uL (ref 4.0–10.5)

## 2014-09-15 LAB — LIPASE, BLOOD: Lipase: 23 U/L (ref 22–51)

## 2014-09-15 MED ORDER — TRAMADOL HCL 50 MG PO TABS: 50.0000 mg | ORAL_TABLET | Freq: Three times a day (TID) | ORAL | Status: DC | PRN

## 2014-09-15 MED ORDER — DIPHENHYDRAMINE HCL 50 MG/ML IJ SOLN
12.5000 mg | Freq: Once | INTRAMUSCULAR | Status: AC
  Administered 2014-09-15: 12.5 mg via INTRAVENOUS
  Filled 2014-09-15: qty 1

## 2014-09-15 MED ORDER — METOCLOPRAMIDE HCL 5 MG/ML IJ SOLN
10.0000 mg | Freq: Once | INTRAMUSCULAR | Status: AC
  Administered 2014-09-15: 10 mg via INTRAVENOUS
  Filled 2014-09-15: qty 2

## 2014-09-15 MED ORDER — DEXTROSE 5 % IV SOLN
1.0000 g | Freq: Once | INTRAVENOUS | Status: AC
  Administered 2014-09-15: 1 g via INTRAVENOUS
  Filled 2014-09-15: qty 10

## 2014-09-15 MED ORDER — IOHEXOL 300 MG/ML  SOLN
50.0000 mL | Freq: Once | INTRAMUSCULAR | Status: AC | PRN
Start: 2014-09-15 — End: 2014-09-15
  Administered 2014-09-15: 50 mL via ORAL

## 2014-09-15 MED ORDER — SODIUM CHLORIDE 0.9 % IV BOLUS (SEPSIS)
1000.0000 mL | Freq: Once | INTRAVENOUS | Status: AC
Start: 2014-09-15 — End: 2014-09-15
  Administered 2014-09-15: 1000 mL via INTRAVENOUS

## 2014-09-15 MED ORDER — CIPROFLOXACIN HCL 500 MG PO TABS: 500.0000 mg | ORAL_TABLET | Freq: Two times a day (BID) | ORAL | Status: DC

## 2014-09-15 MED ORDER — IOHEXOL 300 MG/ML  SOLN
100.0000 mL | Freq: Once | INTRAMUSCULAR | Status: AC | PRN
  Administered 2014-09-15: 100 mL via INTRAVENOUS

## 2014-09-15 NOTE — ED Notes (Signed)
EMT instructor at bedside starting IV

## 2014-09-15 NOTE — ED Notes (Signed)
Patient transported to CT 

## 2014-09-15 NOTE — Discharge Instructions (Signed)
Take cipro twice daily for a week.   Stay hydrated.   Take tramadol for severe pain.   You need to see gastroenterologist for Crohn's disease. You likely need colonoscopy.   You have pinched nerve in your neck. You should talk to a spine surgeon for treatment options.   Return to ER if you have fever, severe pain, vomiting, fevers.

## 2014-09-15 NOTE — ED Notes (Signed)
Pt has pain in neck that goes to shoulder and head. Pt states she feels nauseated, dizzy and fatigue as well . Has vomited twice last night

## 2014-09-15 NOTE — ED Provider Notes (Addendum)
CSN: [id number]     Arrival date & time 09/15/14  1211 History   First MD Initiated Contact with Patient 09/15/14 1249     Chief Complaint  Patient presents with  . Nausea  . Emesis     (Consider location/radiation/quality/duration/timing/severity/associated sxs/prior Treatment) The history is provided by the patient. A language interpreter was used.  Lindsey Massey is a 54 y.o. female history of depression, osteoporosis, reflux here presenting with neck pain, headache, abdominal pain, vomiting. Patient has been having neck pain for the last 3 days. States that the pain radiated down her right arm and is associated with headaches. Patient then developed worsening headache as well as epigastric pain. She vomited several times since yesterday and has some associated chest pain since yesterday. Has some dysuria as well. Patient came from [place] 2 months ago. Daughter is here translating. Denies any sick contacts.      Past Medical History  Diagnosis Date  . Depressed   . Osteoporosis   . Uterine fibroid   . Shortness of breath     sometimes before sleep  . GERD (gastroesophageal reflux disease)   . Hypercholesteremia    Past Surgical History  Procedure Laterality Date  . Tonsillectomy and adenoidectomy    . Tonsillectomy      age 29   Family History  Problem Relation Age of Onset  . Cancer Father     liver  . Hypertension Father   . Hypertension Mother   . Heart disease Mother   . Arthritis Sister   . Hypertension Brother   . Heart disease Paternal Uncle   . Anesthesia problems Neg Hx    History  Substance Use Topics  . Smoking status: Current Every Day Smoker -- 0.30 packs/day    Types: Cigarettes  . Smokeless tobacco: Not on file  . Alcohol Use: No   OB History    Gravida Para Term Preterm AB TAB SAB Ectopic Multiple Living   1    1  1         Review of Systems  Gastrointestinal: Positive for vomiting and abdominal pain.  Musculoskeletal: Positive for neck pain.   Neurological: Positive for headaches.  All other systems reviewed and are negative.     Allergies  Review of patient's allergies indicates no known allergies.  Home Medications   Prior to Admission medications   Medication Sig Start Date End Date Taking? Authorizing Provider  alendronate (FOSAMAX) 70 MG tablet Take 70 mg by mouth every Tuesday. Take with a full glass of water on an empty stomach.   Yes Historical Provider, MD  sertraline (ZOLOFT) 50 MG tablet Take 50 mg by mouth daily.   Yes Historical Provider, MD  simvastatin (ZOCOR) 20 MG tablet Take 10 mg by mouth daily.   Yes Historical Provider, MD  esomeprazole (NEXIUM) 20 MG capsule Take 2 capsules (40 mg total) by mouth daily before breakfast. Patient not taking: Reported on 09/15/2014 01/24/12   Montez Morita, MD  ibandronate (BONIVA) 150 MG tablet Take 1 tablet (150 mg total) by mouth every 30 (thirty) days. Take in the morning with a full glass of water, on an empty stomach, and do not take anything else by mouth or lie down for the next 30 min. Patient not taking: Reported on 09/15/2014 01/24/12   Montez Morita, MD  mirtazapine (REMERON) 15 MG tablet Take 1 tablet (15 mg total) by mouth at bedtime. Patient not taking: Reported on 09/15/2014 01/24/12   Tyler Pita  Hairford, MD  sertraline (ZOLOFT) 25 MG tablet Take 1 tablet (25 mg total) by mouth daily. Patient not taking: Reported on 09/15/2014 01/24/12   Montez Morita, MD  simvastatin (ZOCOR) 10 MG tablet Take 1 tablet (10 mg total) by mouth at bedtime. Patient not taking: Reported on 09/15/2014 01/24/12   Montez Morita, MD  traMADol (ULTRAM) 50 MG tablet Take 1 tablet (50 mg total) by mouth every 8 (eight) hours as needed for pain. Patient not taking: Reported on 09/15/2014 01/24/12   Montez Morita, MD   BP 124/106 mmHg  Pulse 80  Temp(Src) 98 F (36.7 C) (Oral)  Resp 16  SpO2 97%  LMP 12/27/2010 Physical Exam  Constitutional: She is oriented to person, place, and  time.  Uncomfortable   HENT:  Head: Normocephalic.  Mouth/Throat: Oropharynx is clear and moist.  Eyes: Conjunctivae are normal. Pupils are equal, round, and reactive to light.  Neck:  R paracervical tenderness, no midline tenderness   Cardiovascular: Normal rate, regular rhythm and normal heart sounds.   Pulmonary/Chest: Effort normal and breath sounds normal. No respiratory distress. She has no wheezes. She has no rales.  Abdominal: Soft.  + epigastric and periumbilical tenderness.   Musculoskeletal: Normal range of motion.  Neurological: She is alert and oriented to person, place, and time.  CN 2- 12 intact. Nl strength throughout. Nl finger to nose. No pronator drift.   Skin: Skin is warm and dry.  Psychiatric: She has a normal mood and affect. Her behavior is normal. Judgment and thought content normal.  Nursing note and vitals reviewed.   ED Course  Procedures (including critical care time) Labs Review Labs Reviewed  CBC WITH DIFFERENTIAL/PLATELET - Abnormal; Notable for the following:    Platelets 146 (*)    All other components within normal limits  COMPREHENSIVE METABOLIC PANEL - Abnormal; Notable for the following:    Glucose, Bld 101 (*)    Creatinine, Ser 0.39 (*)    Total Protein 6.4 (*)    All other components within normal limits  URINALYSIS, ROUTINE W REFLEX MICROSCOPIC (NOT AT [facility]) - Abnormal; Notable for the following:    APPearance CLOUDY (*)    Hgb urine dipstick SMALL (*)    Leukocytes, UA MODERATE (*)    All other components within normal limits  URINE MICROSCOPIC-ADD ON - Abnormal; Notable for the following:    Squamous Epithelial / LPF MANY (*)    Bacteria, UA MANY (*)    All other components within normal limits  LIPASE, BLOOD  I-STAT TROPOININ, ED    Imaging Review Dg Chest 2 View  09/15/2014   CLINICAL DATA:  Neck pain extending into the shoulders and into the head. Nausea. Dizziness. Abdominal pain. Vomiting.  EXAM: CHEST  2 VIEW  COMPARISON:   06/27/2011  FINDINGS: Heart size and pulmonary vascularity are normal and the lungs are clear. No effusions. No osseous abnormality.  IMPRESSION: Normal chest.   Electronically Signed   By: Lorriane Shire M.D.   On: 09/15/2014 14:01   Ct Head Wo Contrast  09/15/2014   CLINICAL DATA:  Right frontal headache and neck pain.  EXAM: CT HEAD WITHOUT CONTRAST  CT CERVICAL SPINE WITHOUT CONTRAST  TECHNIQUE: Multidetector CT imaging of the head and cervical spine was performed following the standard protocol without intravenous contrast. Multiplanar CT image reconstructions of the cervical spine were also generated.  COMPARISON:  None.  FINDINGS: CT HEAD FINDINGS  The brain demonstrates no evidence of hemorrhage,  infarction, edema, mass effect, extra-axial fluid collection, hydrocephalus or mass lesion. The skull is unremarkable. Mild ethmoid sinus mucosal thickening identified.  CT CERVICAL SPINE FINDINGS  The cervical spine shows normal alignment. There is no evidence of acute fracture or subluxation. No soft tissue swelling or hematoma is identified. Mild uncovertebral proliferative changes are present at C4-5 and C5-6. These levels demonstrate no significant disc space narrowing. There is no evidence of significant spinal stenosis. No bony or soft tissue lesions are seen. The visualized airway is normally patent.  IMPRESSION: 1. Normal head CT. 2. No significant findings in the cervical spine. Mild proliferative changes are present at C4-5 and C5-6.   Electronically Signed   By: Aletta Edouard M.D.   On: 09/15/2014 15:19   Ct Cervical Spine Wo Contrast  09/15/2014   CLINICAL DATA:  Right frontal headache and neck pain.  EXAM: CT HEAD WITHOUT CONTRAST  CT CERVICAL SPINE WITHOUT CONTRAST  TECHNIQUE: Multidetector CT imaging of the head and cervical spine was performed following the standard protocol without intravenous contrast. Multiplanar CT image reconstructions of the cervical spine were also generated.   COMPARISON:  None.  FINDINGS: CT HEAD FINDINGS  The brain demonstrates no evidence of hemorrhage, infarction, edema, mass effect, extra-axial fluid collection, hydrocephalus or mass lesion. The skull is unremarkable. Mild ethmoid sinus mucosal thickening identified.  CT CERVICAL SPINE FINDINGS  The cervical spine shows normal alignment. There is no evidence of acute fracture or subluxation. No soft tissue swelling or hematoma is identified. Mild uncovertebral proliferative changes are present at C4-5 and C5-6. These levels demonstrate no significant disc space narrowing. There is no evidence of significant spinal stenosis. No bony or soft tissue lesions are seen. The visualized airway is normally patent.  IMPRESSION: 1. Normal head CT. 2. No significant findings in the cervical spine. Mild proliferative changes are present at C4-5 and C5-6.   Electronically Signed   By: Aletta Edouard M.D.   On: 09/15/2014 15:19   Ct Abdomen Pelvis W Contrast  09/15/2014   CLINICAL DATA:  Right lower quadrant pain for 3 days. Nausea. Vomiting.  EXAM: CT ABDOMEN AND PELVIS WITH CONTRAST  TECHNIQUE: Multidetector CT imaging of the abdomen and pelvis was performed using the standard protocol following bolus administration of intravenous contrast.  CONTRAST:  64mL OMNIPAQUE IOHEXOL 300 MG/ML SOLN, 158mL OMNIPAQUE IOHEXOL 300 MG/ML SOLN  COMPARISON:  None.  FINDINGS: There is chronic mucosal thickening and fatty infiltration of the wall of the distal ileum over a 10 cm segment. There is no inflammation in the adjacent soft tissues. Cecum and appendix are normal. The colon and the remainder of the small bowel are also normal as is the stomach.  There is a 2.5 cm hemangioma in the right lobe of the liver. Liver is otherwise normal. Biliary tree, spleen, pancreas, adrenal glands, and kidneys are normal. No free air or free fluid. No adenopathy. Uterus and ovaries are normal. Bladder is normal. No osseous abnormality.  IMPRESSION: Chronic  inflammatory changes of the terminal ileum. Otherwise, normal exam. Does the patient have a history of Crohn's disease?   Electronically Signed   By: Lorriane Shire M.D.   On: 09/15/2014 14:44     EKG Interpretation   Date/Time:  Thursday September 15 2014 12:27:46 EDT Ventricular Rate:  77 PR Interval:  131 QRS Duration: 77 QT Interval:  399 QTC Calculation: 452 R Axis:   56 Text Interpretation:  Sinus rhythm No significant change since last  tracing Confirmed  by Darl Householder  MD, Demetreus Lothamer ([id number]) on 09/15/2014 1:09:47 PM      MDM   Final diagnoses:  Abdominal pain  Abdominal pain    Jazelyn Sipe is a 54 y.o. female here with headache, ab pain, vomiting, chest pain. Likely complex migraine vs gastro vs appendicitis vs chole vs UTI. I doubt ACS or PE or dissection. Will get labs, CT head, CXR, CT ab/pel. Will give migraine cocktail, pain meds and reassess.   3:35 PM CT showed possible crohn's disease. WBC nl. UA + UTI. CT neck showed arthritis. I think symptoms likely from UTI. Upon further questioning, has chronic ab pain so will refer to GI for colonoscopy. R arm paresthesias likely from cervical radiculopathy so will refer to spine doctor. Will dc with cipro, tramadol.     Wandra Arthurs, MD 09/15/14 Darling Akiva Josey, MD 09/15/14 [phone]

## 2014-09-17 LAB — URINE CULTURE: Colony Count: >=100000

## 2023-01-02 ENCOUNTER — Emergency Department (HOSPITAL_BASED_OUTPATIENT_CLINIC_OR_DEPARTMENT_OTHER): Payer: 59

## 2023-01-02 ENCOUNTER — Emergency Department (HOSPITAL_BASED_OUTPATIENT_CLINIC_OR_DEPARTMENT_OTHER)
Admission: EM | Admit: 2023-01-02 | Discharge: 2023-01-02 | Disposition: A | Discharge status: Home / Self Care | Payer: 59 | Attending: Emergency Medicine | Admitting: Emergency Medicine

## 2023-01-02 ENCOUNTER — Other Ambulatory Visit: Payer: Self-pay

## 2023-01-02 ENCOUNTER — Encounter (HOSPITAL_BASED_OUTPATIENT_CLINIC_OR_DEPARTMENT_OTHER): Payer: Self-pay | Admitting: Emergency Medicine

## 2023-01-02 DIAGNOSIS — R1030 Lower abdominal pain, unspecified: Secondary | ICD-10-CM | POA: Diagnosis not present

## 2023-01-02 DIAGNOSIS — I517 Cardiomegaly: Secondary | ICD-10-CM | POA: Diagnosis not present

## 2023-01-02 DIAGNOSIS — R109 Unspecified abdominal pain: Secondary | ICD-10-CM | POA: Diagnosis not present

## 2023-01-02 DIAGNOSIS — R1032 Left lower quadrant pain: Secondary | ICD-10-CM | POA: Diagnosis not present

## 2023-01-02 DIAGNOSIS — K5792 Diverticulitis of intestine, part unspecified, without perforation or abscess without bleeding: Secondary | ICD-10-CM | POA: Diagnosis not present

## 2023-01-02 DIAGNOSIS — K50019 Crohn's disease of small intestine with unspecified complications: Secondary | ICD-10-CM

## 2023-01-02 DIAGNOSIS — K5732 Diverticulitis of large intestine without perforation or abscess without bleeding: Secondary | ICD-10-CM | POA: Insufficient documentation

## 2023-01-02 DIAGNOSIS — K50018 Crohn's disease of small intestine with other complication: Secondary | ICD-10-CM | POA: Insufficient documentation

## 2023-01-02 DIAGNOSIS — R519 Headache, unspecified: Secondary | ICD-10-CM | POA: Diagnosis not present

## 2023-01-02 DIAGNOSIS — Z79899 Other long term (current) drug therapy: Secondary | ICD-10-CM | POA: Insufficient documentation

## 2023-01-02 DIAGNOSIS — R0602 Shortness of breath: Secondary | ICD-10-CM | POA: Insufficient documentation

## 2023-01-02 DIAGNOSIS — I10 Essential (primary) hypertension: Secondary | ICD-10-CM | POA: Insufficient documentation

## 2023-01-02 DIAGNOSIS — K5090 Crohn's disease, unspecified, without complications: Secondary | ICD-10-CM | POA: Diagnosis not present

## 2023-01-02 DIAGNOSIS — F17200 Nicotine dependence, unspecified, uncomplicated: Secondary | ICD-10-CM | POA: Insufficient documentation

## 2023-01-02 DIAGNOSIS — K219 Gastro-esophageal reflux disease without esophagitis: Secondary | ICD-10-CM | POA: Diagnosis not present

## 2023-01-02 HISTORY — DX: Essential (primary) hypertension: I10

## 2023-01-02 HISTORY — DX: Pure hypercholesterolemia, unspecified: E78.00

## 2023-01-02 HISTORY — DX: Crohn's disease, unspecified, without complications: K50.90

## 2023-01-02 LAB — CBC
HCT: 41.1 % (ref 36.0–46.0)
Hemoglobin: 13.5 g/dL (ref 12.0–15.0)
MCH: 29.7 pg (ref 26.0–34.0)
MCHC: 32.8 g/dL (ref 30.0–36.0)
MCV: 90.5 fL (ref 80.0–100.0)
Platelets: 161 10*3/uL (ref 150–400)
RBC: 4.54 MIL/uL (ref 3.87–5.11)
RDW: 13.9 % (ref 11.5–15.5)
WBC: 5 10*3/uL (ref 4.0–10.5)
nRBC: 0 % (ref 0.0–0.2)

## 2023-01-02 LAB — URINALYSIS, ROUTINE W REFLEX MICROSCOPIC
Bilirubin Urine: NEGATIVE
Glucose, UA: NEGATIVE mg/dL
Ketones, ur: NEGATIVE mg/dL
Leukocytes,Ua: NEGATIVE
Nitrite: NEGATIVE
Protein, ur: NEGATIVE mg/dL
Specific Gravity, Urine: <=1.005 (ref 1.005–1.030)
pH: 5.5 (ref 5.0–8.0)

## 2023-01-02 LAB — COMPREHENSIVE METABOLIC PANEL
ALT: 15 U/L (ref 0–44)
AST: 15 U/L (ref 15–41)
Albumin: 4 g/dL (ref 3.5–5.0)
Alkaline Phosphatase: 104 U/L (ref 38–126)
Anion gap: 12 (ref 5–15)
BUN: 9 mg/dL (ref 8–23)
CO2: 24 mmol/L (ref 22–32)
Calcium: 9.3 mg/dL (ref 8.9–10.3)
Chloride: 102 mmol/L (ref 98–111)
Creatinine, Ser: 0.4 mg/dL — ABNORMAL LOW (ref 0.44–1.00)
GFR, Estimated: >60 mL/min (ref >60)
Glucose, Bld: 95 mg/dL (ref 70–99)
Potassium: 3.8 mmol/L (ref 3.5–5.1)
Sodium: 138 mmol/L (ref 135–145)
Total Bilirubin: 0.4 mg/dL (ref 0.3–1.2)
Total Protein: 7.2 g/dL (ref 6.5–8.1)

## 2023-01-02 LAB — LIPASE, BLOOD: Lipase: 30 U/L (ref 11–51)

## 2023-01-02 LAB — URINALYSIS, MICROSCOPIC (REFLEX): WBC, UA: NONE SEEN WBC/hpf (ref 0–5)

## 2023-01-02 MED ORDER — ONDANSETRON HCL 4 MG/2ML IJ SOLN
4.0000 mg | Freq: Once | INTRAMUSCULAR | Status: AC
  Administered 2023-01-02: 4 mg via INTRAVENOUS
  Filled 2023-01-02: qty 2

## 2023-01-02 MED ORDER — SODIUM CHLORIDE 0.9 % IV SOLN: INTRAVENOUS | Status: DC

## 2023-01-02 MED ORDER — HYDROMORPHONE HCL 1 MG/ML IJ SOLN
0.5000 mg | Freq: Once | INTRAMUSCULAR | Status: AC
  Administered 2023-01-02: 0.5 mg via INTRAVENOUS
  Filled 2023-01-02: qty 1

## 2023-01-02 MED ORDER — AMOXICILLIN-POT CLAVULANATE 875-125 MG PO TABS: 1.0000 | ORAL_TABLET | Freq: Two times a day (BID) | ORAL | 0 refills | Status: DC

## 2023-01-02 MED ORDER — IOHEXOL 300 MG/ML  SOLN
100.0000 mL | Freq: Once | INTRAMUSCULAR | Status: AC | PRN
  Administered 2023-01-02: 100 mL via INTRAVENOUS

## 2023-01-02 MED ORDER — AMOXICILLIN-POT CLAVULANATE 875-125 MG PO TABS
1.0000 | ORAL_TABLET | Freq: Once | ORAL | Status: AC
  Administered 2023-01-02: 1 via ORAL
  Filled 2023-01-02: qty 1

## 2023-01-02 NOTE — ED Provider Notes (Addendum)
[facility] Provider Note   CSN: [id number] Arrival date & time: 01/02/23  1533     History  Chief Complaint  Patient presents with   Abdominal Pain    Lindsey Massey is a 62 y.o. female.  Patient here with lower quadrant abdominal pains been going on for 2 days.  No diarrhea no vomiting.  Felt nauseated.  Also occasionally some shortness of breath feeling.  But not persistent.  And patient complained of a mild headache.  Patient appears in no distress.  Chart review shows CT scan from June 2016 that showed terminal ileitis.  Patient with questionable history of Crohn's disease.  Patient spends some of her time here in the other part in the [place].  Temp here 98.8 oxygen saturation 97% blood pressure 109/83 pulse 91 respirations 18.  Patient is not on any steroids.  Patient appears nontoxic no acute distress.  Patient is an everyday smoker.  Past medical history significant for depression gastroesophageal reflux disease high cholesterol hypertension and questionable Crohn's disease.       Home Medications Prior to Admission medications   Medication Sig Start Date End Date Taking? Authorizing Provider  alendronate (FOSAMAX) 70 MG tablet Take 70 mg by mouth every Tuesday. Take with a full glass of water on an empty stomach.    [provider]  ciprofloxacin (CIPRO) 500 MG tablet Take 1 tablet (500 mg total) by mouth 2 (two) times daily. One po bid x 7 days 09/15/14   Charlynne Pander, MD  esomeprazole (NEXIUM) 20 MG capsule Take 2 capsules (40 mg total) by mouth daily before breakfast. Patient not taking: Reported on 09/15/2014 01/24/12   Hairford, Ricki Miller, MD  ibandronate (BONIVA) 150 MG tablet Take 1 tablet (150 mg total) by mouth every 30 (thirty) days. Take in the morning with a full glass of water, on an empty stomach, and do not take anything else by mouth or lie down for the next 30 min. Patient not taking: Reported on  09/15/2014 01/24/12   Hairford, Ricki Miller, MD  mirtazapine (REMERON) 15 MG tablet Take 1 tablet (15 mg total) by mouth at bedtime. Patient not taking: Reported on 09/15/2014 01/24/12   Hairford, Ricki Miller, MD  sertraline (ZOLOFT) 25 MG tablet Take 1 tablet (25 mg total) by mouth daily. Patient not taking: Reported on 09/15/2014 01/24/12   Hairford, Ricki Miller, MD  sertraline (ZOLOFT) 50 MG tablet Take 50 mg by mouth daily.    [provider]  simvastatin (ZOCOR) 10 MG tablet Take 1 tablet (10 mg total) by mouth at bedtime. Patient not taking: Reported on 09/15/2014 01/24/12   Hairford, Ricki Miller, MD  simvastatin (ZOCOR) 20 MG tablet Take 10 mg by mouth daily.    [provider]  traMADol (ULTRAM) 50 MG tablet Take 1 tablet (50 mg total) by mouth every 8 (eight) hours as needed. 09/15/14   Charlynne Pander, MD      Allergies    Patient has no known allergies.    Review of Systems   Review of Systems  Constitutional:  Negative for chills and fever.  HENT:  Negative for ear pain and sore throat.   Eyes:  Negative for pain and visual disturbance.  Respiratory:  Positive for shortness of breath. Negative for cough.   Cardiovascular:  Negative for chest pain and palpitations.  Gastrointestinal:  Positive for abdominal pain and nausea. Negative for blood in stool, diarrhea and vomiting.  Genitourinary:  Negative for dysuria and hematuria.  Musculoskeletal:  Negative for arthralgias and back pain.  Skin:  Negative for color change and rash.  Neurological:  Positive for headaches. Negative for seizures and syncope.  All other systems reviewed and are negative.   Physical Exam Updated Vital Signs BP (!) 143/86 (BP Location: Right Arm)   Pulse (!) 59   Temp 98.1 F (36.7 C) (Oral)   Resp 18   Ht 1.676 m (5\' 6" )   Wt 80.7 kg   LMP 12/27/2010   SpO2 95%   BMI 28.73 kg/m  Physical Exam Vitals and nursing note reviewed.  Constitutional:      General: She is not in acute distress.     Appearance: Normal appearance. She is well-developed.  HENT:     Head: Normocephalic and atraumatic.  Eyes:     Extraocular Movements: Extraocular movements intact.     Conjunctiva/sclera: Conjunctivae normal.     Pupils: Pupils are equal, round, and reactive to light.  Cardiovascular:     Rate and Rhythm: Normal rate and regular rhythm.     Heart sounds: No murmur heard. Pulmonary:     Effort: Pulmonary effort is normal. No respiratory distress.     Breath sounds: Normal breath sounds.  Abdominal:     General: There is no distension.     Palpations: Abdomen is soft.     Tenderness: There is no abdominal tenderness. There is no guarding.     Comments: No tenderness to palpation.  Musculoskeletal:        General: No swelling.     Cervical back: Normal range of motion and neck supple.  Skin:    General: Skin is warm and dry.     Capillary Refill: Capillary refill takes less than 2 seconds.  Neurological:     General: No focal deficit present.     Mental Status: She is alert and oriented to person, place, and time.  Psychiatric:        Mood and Affect: Mood normal.     ED Results / Procedures / Treatments   Labs (all labs ordered are listed, but only abnormal results are displayed) Labs Reviewed  COMPREHENSIVE METABOLIC PANEL - Abnormal; Notable for the following components:      Result Value   Creatinine, Ser 0.40 (*)    All other components within normal limits  URINALYSIS, ROUTINE W REFLEX MICROSCOPIC - Abnormal; Notable for the following components:   Hgb urine dipstick SMALL (*)    All other components within normal limits  URINALYSIS, MICROSCOPIC (REFLEX) - Abnormal; Notable for the following components:   Bacteria, UA RARE (*)    All other components within normal limits  LIPASE, BLOOD  CBC    EKG None  Radiology No results found.  Procedures Procedures    Medications Ordered in ED Medications  0.9 %  sodium chloride infusion ( Intravenous New  Bag/Given 01/02/23 1902)    ED Course/ Medical Decision Making/ A&P                                 Medical Decision Making Amount and/or Complexity of Data Reviewed Labs: ordered. Radiology: ordered.  Risk Prescription drug management.   Patient with some lower quadrant abdominal pain.  Appears to be nontoxic no acute distress.  Abdomen soft and nontender.  But with her past concerns for possible Crohn's disease will get CT scan of  the abdomen.  Also get chest x-ray.  Lipase is 30 complete metabolic panel liver function test are normal and renal functions normal.  White blood cell count no leukocytosis hemoglobin is 13.4 platelets 161 urinalysis negative for urinary tract infection.  Urinary urinalysis 6-10 red blood cells no white blood cells rare bacteria.  Best we can tell patient does not have a history of kidney stones.  Chest x-ray no acute cardiopulmonary disease.  CT abdomen and pelvis is acute uncomplicated sigmoid diverticulitis acute on chronic inflammation of 9 cm segment of terminal ileum suggestive of Crohn's disease.  Start patient with Augmentin.  Will give first dose here.  Will have her follow-up with GI this is a second CT that showed terminal ileitis.  Precautions provided when to return.   Final Clinical Impression(s) / ED Diagnoses Final diagnoses:  Lower abdominal pain    Rx / DC Orders ED Discharge Orders     None         Vanetta Mulders, MD 01/02/23 Illene Bolus, MD 01/02/23 2150

## 2023-01-02 NOTE — Discharge Instructions (Addendum)
Take the antibiotic Augmentin as directed.  Make an appointment to follow-up with GI medicine regarding the concerns for possible Crohn's disease.  Would expect improvement over the next couple days.  Return for any new or worse symptoms.

## 2023-01-02 NOTE — ED Triage Notes (Addendum)
All over abd pain x 2 days no diarrhea  or vomiting but has felt nausea, states occ hard to breath and she has a h/a

## 2023-01-08 ENCOUNTER — Ambulatory Visit (INDEPENDENT_AMBULATORY_CARE_PROVIDER_SITE_OTHER): Payer: 59 | Admitting: Gastroenterology

## 2023-01-08 ENCOUNTER — Other Ambulatory Visit (INDEPENDENT_AMBULATORY_CARE_PROVIDER_SITE_OTHER): Payer: 59

## 2023-01-08 ENCOUNTER — Encounter: Payer: Self-pay | Admitting: Gastroenterology

## 2023-01-08 VITALS — BP 110/70 | HR 84 | Ht 66.0 in | Wt 175.0 lb

## 2023-01-08 DIAGNOSIS — R0602 Shortness of breath: Secondary | ICD-10-CM

## 2023-01-08 DIAGNOSIS — E785 Hyperlipidemia, unspecified: Secondary | ICD-10-CM

## 2023-01-08 DIAGNOSIS — Z79899 Other long term (current) drug therapy: Secondary | ICD-10-CM | POA: Diagnosis not present

## 2023-01-08 DIAGNOSIS — K5000 Crohn's disease of small intestine without complications: Secondary | ICD-10-CM

## 2023-01-08 DIAGNOSIS — K50919 Crohn's disease, unspecified, with unspecified complications: Secondary | ICD-10-CM

## 2023-01-08 DIAGNOSIS — M810 Age-related osteoporosis without current pathological fracture: Secondary | ICD-10-CM | POA: Diagnosis not present

## 2023-01-08 DIAGNOSIS — I10 Essential (primary) hypertension: Secondary | ICD-10-CM

## 2023-01-08 DIAGNOSIS — K219 Gastro-esophageal reflux disease without esophagitis: Secondary | ICD-10-CM | POA: Diagnosis not present

## 2023-01-08 DIAGNOSIS — R0609 Other forms of dyspnea: Secondary | ICD-10-CM

## 2023-01-08 DIAGNOSIS — D84821 Immunodeficiency due to drugs: Secondary | ICD-10-CM | POA: Insufficient documentation

## 2023-01-08 LAB — COMPREHENSIVE METABOLIC PANEL
ALT: 14 U/L (ref 0–35)
AST: 16 U/L (ref 0–37)
Albumin: 4.2 g/dL (ref 3.5–5.2)
Alkaline Phosphatase: 105 U/L (ref 39–117)
BUN: 9 mg/dL (ref 6–23)
CO2: 27 meq/L (ref 19–32)
Calcium: 9.7 mg/dL (ref 8.4–10.5)
Chloride: 105 meq/L (ref 96–112)
Creatinine, Ser: 0.49 mg/dL (ref 0.40–1.20)
GFR: 101.65 mL/min (ref >60.00)
Glucose, Bld: 88 mg/dL (ref 70–99)
Potassium: 3.9 meq/L (ref 3.5–5.1)
Sodium: 140 meq/L (ref 135–145)
Total Bilirubin: 0.4 mg/dL (ref 0.2–1.2)
Total Protein: 6.9 g/dL (ref 6.0–8.3)

## 2023-01-08 LAB — CBC
HCT: 40.9 % (ref 36.0–46.0)
Hemoglobin: 13.6 g/dL (ref 12.0–15.0)
MCHC: 33.2 g/dL (ref 30.0–36.0)
MCV: 90.3 fL (ref 78.0–100.0)
Platelets: 192 10*3/uL (ref 150.0–400.0)
RBC: 4.52 Mil/uL (ref 3.87–5.11)
RDW: 14.5 % (ref 11.5–15.5)
WBC: 4.5 10*3/uL (ref 4.0–10.5)

## 2023-01-08 LAB — SEDIMENTATION RATE: Sed Rate: 56 mm/h — ABNORMAL HIGH (ref 0–30)

## 2023-01-08 LAB — TSH: TSH: 3.34 u[IU]/mL (ref 0.35–5.50)

## 2023-01-08 LAB — C-REACTIVE PROTEIN: CRP: <1 mg/dL (ref 0.5–20.0)

## 2023-01-08 MED ORDER — CANDESARTAN CILEXETIL 8 MG PO TABS: 8.0000 mg | ORAL_TABLET | Freq: Every day | ORAL | 0 refills | Status: DC

## 2023-01-08 MED ORDER — AZATHIOPRINE 50 MG PO TABS: 50.0000 mg | ORAL_TABLET | Freq: Every day | ORAL | 0 refills | Status: DC

## 2023-01-08 MED ORDER — DOXAZOSIN MESYLATE ER 4 MG PO TB24: 4.0000 mg | ORAL_TABLET | Freq: Every day | ORAL | 0 refills | Status: DC

## 2023-01-08 MED ORDER — SERTRALINE HCL 50 MG PO TABS: 50.0000 mg | ORAL_TABLET | Freq: Every day | ORAL | 0 refills | Status: DC

## 2023-01-08 MED ORDER — IBANDRONATE SODIUM 150 MG PO TABS: 150.0000 mg | ORAL_TABLET | ORAL | 0 refills | Status: DC

## 2023-01-08 MED ORDER — BUDESONIDE 3 MG PO CPEP: 9.0000 mg | ORAL_CAPSULE | Freq: Every day | ORAL | 2 refills | Status: DC

## 2023-01-08 MED ORDER — DILTIAZEM HCL 90 MG PO TABS: 90.0000 mg | ORAL_TABLET | Freq: Two times a day (BID) | ORAL | 0 refills | Status: DC

## 2023-01-08 MED ORDER — SIMVASTATIN 20 MG PO TABS: 10.0000 mg | ORAL_TABLET | Freq: Every day | ORAL | 0 refills | Status: DC

## 2023-01-08 MED ORDER — ESOMEPRAZOLE MAGNESIUM 20 MG PO CPDR: 40.0000 mg | DELAYED_RELEASE_CAPSULE | Freq: Every day | ORAL | 1 refills | Status: DC

## 2023-01-08 MED ORDER — VITAMIN D3 125 MCG (5000 UT) PO CAPS: 1.0000 | ORAL_CAPSULE | Freq: Every day | ORAL | 0 refills | Status: DC

## 2023-01-08 MED ORDER — MIRTAZAPINE 15 MG PO TABS: 15.0000 mg | ORAL_TABLET | Freq: Every day | ORAL | 0 refills | Status: DC

## 2023-01-08 NOTE — Progress Notes (Signed)
GASTROENTEROLOGY OUTPATIENT CLINIC VISIT   Primary Care Provider Pcp, No No address on file None  Referring Provider No referring provider defined for this encounter.   Patient Profile: Lindsey Massey is a 62 y.o. female with a pmh significant for hypertension, osteoporosis, hyperlipidemia, anxiety, MDD, GERD,?Crohn's disease v Terminal Ileitis.  The patient presents to the [facility] for an evaluation and management of problem(s) noted below:  Problem List 1. 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 Gastroesophageal reflux disease, unspecified whether esophagitis present   3. Immunosuppression due to drug therapy (HCC)   4. SOB (shortness of breath)   5. DOE (dyspnea on exertion)   6. Essential hypertension   7. Osteoporosis, unspecified osteoporosis type, unspecified pathological fracture presence   8. Hyperlipidemia, unspecified hyperlipidemia type     History of Present Illness This is the patient's first visit to the outpatient  GI clinic, she is accompanied by her cousin who she requests to perform her translation instead of a translator himself.  She has moved to this area from [place].  She has no primary care provider.  She recently went to the emergency department with abdominal pain and changes in her bowel habits.  She was found on CT imaging to have evidence of terminal ileal inflammation concerning for possible Crohn's disease.  She now lives in [place] and will not be going back to [place].  She states in 2020 or 2021 she had an upper endoscopy and lower endoscopy performed and was told that she has a GI disorder and was started on Imuran as well as Nexium.  The patient thinks that she was told she had Crohn's disease.  Patient had an upper endoscopy in 2013 by Dr. Dulce Sellar which was grossly unremarkable with negative biopsies.  She has heartburn symptoms regularly but they are relatively controlled with her Nexium.  She does not have  dysphagia symptoms.  She has bowel movements on a daily basis between 2 and 5 mostly 4.  She has not had any blood in her stools.  She has not had any mucus in her stools.  She has fecal urgency.  She has woken up in the middle of the night to have a bowel movement.  She complains of easy bruising.  She continues to have abdominal pain that comes and goes on a near daily basis.  The patient does also express that she has had issues with exertional shortness of breath while walking even short distances.  She states she was previously evaluated in [place] for this and told that she did not have any heart issues but she does not have any current primary care doctor or cardiologist in the area.  GI Review of Systems Positive as above Negative for odynophagia, nausea, vomiting, melena, medic easier  Review of Systems General: Denies fevers/chills/weight loss unintentionally HEENT: Denies oral lesions Cardiovascular: Denies chest pain/palpitations Pulmonary: Denies cough Gastroenterological: See HPI Genitourinary: Denies darkened urine Hematological: Denies easy bruising/bleeding Endocrine: Denies temperature intolerance Dermatological: Denies jaundice Psychological: Mood is stable   Medications Current Outpatient Medications  Medication Sig Dispense Refill   ALPRAZolam (XANAX) 0.5 MG tablet Take 0.5 mg by mouth at bedtime as needed for anxiety.     amoxicillin-clavulanate (AUGMENTIN) 875-125 MG tablet Take 1 tablet by mouth every 12 (twelve) hours. 14 tablet 0   aspirin EC 81 MG tablet Take 81 mg by mouth daily. Swallow whole.     budesonide (ENTOCORT EC) 3 MG 24 hr capsule Take 3 capsules (9  mg total) by mouth daily. 30 capsule 2   calcium carbonate (TUMS - DOSED IN MG ELEMENTAL CALCIUM) 500 MG chewable tablet Chew 1 tablet by mouth daily.     traMADol (ULTRAM) 50 MG tablet Take 1 tablet (50 mg total) by mouth every 8 (eight) hours as needed. 10 tablet 0   azaTHIOprine (IMURAN) 50 MG tablet  Take 1 tablet (50 mg total) by mouth daily. 30 tablet 0   candesartan (ATACAND) 8 MG tablet Take 1 tablet (8 mg total) by mouth daily. 30 tablet 0   Cholecalciferol (VITAMIN D3) 125 MCG (5000 UT) CAPS Take 1 capsule (5,000 Units total) by mouth daily. 30 capsule 0   diltiazem (CARDIZEM) 90 MG tablet Take 1 tablet (90 mg total) by mouth 2 (two) times daily. 60 tablet 0   doxazosin (CARDURA XL) 4 MG 24 hr tablet Take 1 tablet (4 mg total) by mouth daily with breakfast. 30 tablet 0   esomeprazole (NEXIUM) 20 MG capsule Take 2 capsules (40 mg total) by mouth daily before breakfast. 60 capsule 1   ibandronate (BONIVA) 150 MG tablet Take 1 tablet (150 mg total) by mouth every 30 (thirty) days. Take in the morning with a full glass of water, on an empty stomach, and do not take anything else by mouth or lie down for the next 30 min. 1 tablet 0   mirtazapine (REMERON) 15 MG tablet Take 1 tablet (15 mg total) by mouth at bedtime. 30 tablet 0   sertraline (ZOLOFT) 50 MG tablet Take 1 tablet (50 mg total) by mouth daily. 30 tablet 0   simvastatin (ZOCOR) 20 MG tablet Take 0.5 tablets (10 mg total) by mouth daily. 30 tablet 0   No current facility-administered medications for this visit.    Allergies No Known Allergies  Histories Past Medical History:  Diagnosis Date   Crohn disease (HCC)    Depressed    GERD (gastroesophageal reflux disease)    High cholesterol    Hypercholesteremia    Hypertension    Osteoporosis    Shortness of breath    sometimes before sleep   Uterine fibroid    Past Surgical History:  Procedure Laterality Date   TONSILLECTOMY     age 61   TONSILLECTOMY AND ADENOIDECTOMY     Social History   Socioeconomic History   Marital status: Single    Spouse name: Not on file   Number of children: Not on file   Years of education: Not on file   Highest education level: Not on file  Occupational History   Not on file  Tobacco Use   Smoking status: Every Day    Current  packs/day: 0.30    Types: Cigarettes   Smokeless tobacco: Not on file  Vaping Use   Vaping status: Never Used  Substance and Sexual Activity   Alcohol use: No   Drug use: No   Sexual activity: Never  Other Topics Concern   Not on file  Social History Narrative   Lives with sister and nephews (18,16) in [place]. Moved from [place] in April 2012. Separated from husband. No children. Does not work.      Balanced diet. Little exercise.      Christian faith.   Social Determinants of Health   Financial Resource Strain: Not on file  Food Insecurity: Not on file  Transportation Needs: Not on file  Physical Activity: Not on file  Stress: Not on file  Social Connections: Not on file  Intimate  Partner Violence: Not on file   Family History  Problem Relation Age of Onset   Hypertension Mother    Heart disease Mother    Cancer Father        liver   Hypertension Father    Arthritis Sister    Hypertension Brother    Heart disease Paternal Uncle    Anesthesia problems Neg Hx    Colon cancer Neg Hx    Esophageal cancer Neg Hx    Inflammatory bowel disease Neg Hx    Liver disease Neg Hx    Pancreatic cancer Neg Hx    Rectal cancer Neg Hx    Stomach cancer Neg Hx    I have reviewed her medical, social, and family history in detail and updated the electronic medical record as necessary.    PHYSICAL EXAMINATION  BP 110/70 (BP Location: Left Arm, Patient Position: Sitting, Cuff Size: Normal)   Pulse 84   Ht 5\' 6"  (1.676 m)   Wt 175 lb (79.4 kg)   LMP 12/27/2010   BMI 28.25 kg/m  Wt Readings from Last 3 Encounters:  01/08/23 175 lb (79.4 kg)  01/02/23 178 lb (80.7 kg)  01/24/12 172 lb (78 kg)  GEN: NAD, appears stated age, doesn't appear chronically ill, accompanied by cousin PSYCH: Cooperative, without pressured speech EYE: Conjunctivae pink, sclerae anicteric ENT: MMM, without oral ulcers CV: RR without R/Gs  RESP: CTAB posteriorly, without wheezing GI: NABS, soft,  TTP in lower quadrants bilaterally, without rebound or guarding, no HSM appreciated MSK/EXT: No significant lower extremity edema SKIN: No jaundice NEURO:  Alert & Oriented x 3, no focal deficits   REVIEW OF DATA  I reviewed the following data at the time of this encounter:  GI Procedures and Studies  2013 EGD ([facility]) Mild antral and body gastritis.  Biopsied. Otherwise normal endoscopy.  No clear source of chest pain.  Laboratory Studies  Reviewed those in [facility]  Imaging Studies  2016 CTAP IMPRESSION: Chronic inflammatory changes of the terminal ileum. Otherwise, normal exam. Does the patient have a history of Crohn's disease?  2024 CTAP IMPRESSION: 1. Acute uncomplicated sigmoid diverticulitis. 2. Acute on chronic inflammation of a 9 cm segment of terminal ileum. This is nonspecific but suggestive of Crohn's disease.   ASSESSMENT  Ms. Brand is a 62 y.o. female with a pmh significant for hypertension, osteoporosis, hyperlipidemia, anxiety, MDD, GERD,?Crohn's disease v Terminal Ileitis.  The patient is seen today for evaluation and management of:  1. 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 Gastroesophageal reflux disease, unspecified whether esophagitis present   3. Immunosuppression due to drug therapy (HCC)   4. SOB (shortness of breath)   5. DOE (dyspnea on exertion)   6. Essential hypertension   7. Osteoporosis, unspecified osteoporosis type, unspecified pathological fracture presence   8. Hyperlipidemia, unspecified hyperlipidemia type    From a GI perspective the patient is hemodynamically stable.  Clinically she has what appears to be evidence of likely Crohn's disease based on multiple imaging studies and a report of a possible diagnosis of Crohn's disease as well as being on Imuran for the last 3 years.  She has not had any follow-up with gastroenterology in years.  She now presents with recurring abdominal pain and imaging that is concerning.  At this  point before we can consider more significant biologic therapy, I think we need to get the patient set up for laboratory evaluation and stool testing.  She needs a colonoscopy and an upper  endoscopy but I cannot perform this safely until we know that she has a structurally sound heart and is doing well from a primary care/possible cardiology evaluation.  We will get her ready from a laboratory standpoint and what looks to be underlying Crohn's disease for the potential need of biologic therapy.  We briefly discussed Biologics but we need further evaluation before we consider more aggressive treatment.  We are going to see how she does with budesonide while we get her ready for her procedures in the coming weeks.  I will order a TTE to try and get this performed to ensure that heart function wise she is doing okay.  Her chest x-ray was reviewed and is unremarkable.  She has no access to primary care at this point and is nearly out of all of her medications.  I will give her 1 month of her medications but I will not be refilling anything other than GI medications moving forward until I am able to perform a colonoscopy.  I am not going to be giving her pain medications and I will not be giving any anxiolytics.  We will try to get the records from outpatient [place] EGD/colonoscopy though I am not hopeful that this will occur.  All patient questions were answered to the best of my ability, and the patient agrees to the aforementioned plan of action with follow-up as indicated.   PLAN  Laboratories as outlined below Fecal calprotectin to be obtained Continue Imuran 50 mg daily for now Nexium 20 mg once daily (12 months refills okay) Initiate budesonide 9 mg daily Diagnostic endoscopy/colonoscopy is recommended - Need to get approval and rule out any issues from a cardiac or primary care provider perspective before we put her through anesthesia TTE to be performed PCP referral placed Cardiology referral  placed Candesartan 8 mg daily (1 month no refills) Vitamin D3 125 mcg twice daily (1 month no refills) Diltiazem 90 mg twice daily (1 month no refills) Doxazosin 4 mg daily (1 month no refills) Boniva 150 mg monthly (1 month no refills) Remeron 15 mg at bedtime (1 month no refills) Simvastatin 10 mg daily (1 month no refills)   Orders Placed This Encounter  Procedures   Calprotectin, Fecal   CBC   Comp Met (CMET)   TSH   Sedimentation rate   C-reactive protein   IgA   Tissue transglutaminase, IgA   Hepatitis A Antibody, Total   Hepatitis B Surface AntiBODY   Hepatitis B Surface AntiGEN   Hepatitis B core antibody, total   Hepatitis C Antibody   HIV antibody (with reflex)   QuantiFERON-TB Gold Plus   Thiopurine methyltransferase(tpmt)rbc   Ambulatory referral to Internal Medicine   Ambulatory referral to Cardiology    New Prescriptions   BUDESONIDE (ENTOCORT EC) 3 MG 24 HR CAPSULE    Take 3 capsules (9 mg total) by mouth daily.   Modified Medications   Modified Medication Previous Medication   AZATHIOPRINE (IMURAN) 50 MG TABLET azaTHIOprine (IMURAN) 50 MG tablet      Take 1 tablet (50 mg total) by mouth daily.    Take 50 mg by mouth daily.   CANDESARTAN (ATACAND) 8 MG TABLET candesartan (ATACAND) 8 MG tablet      Take 1 tablet (8 mg total) by mouth daily.    Take 8 mg by mouth daily.   CHOLECALCIFEROL (VITAMIN D3) 125 MCG (5000 UT) CAPS Cholecalciferol (VITAMIN D3) 125 MCG (5000 UT) CAPS      Take  1 capsule (5,000 Units total) by mouth daily.    Take 1 capsule by mouth in the morning and at bedtime.   DILTIAZEM (CARDIZEM) 90 MG TABLET diltiazem (CARDIZEM) 90 MG tablet      Take 1 tablet (90 mg total) by mouth 2 (two) times daily.    Take 90 mg by mouth 2 (two) times daily.   DOXAZOSIN (CARDURA XL) 4 MG 24 HR TABLET doxazosin (CARDURA XL) 4 MG 24 hr tablet      Take 1 tablet (4 mg total) by mouth daily with breakfast.    Take 4 mg by mouth daily with breakfast.    ESOMEPRAZOLE (NEXIUM) 20 MG CAPSULE esomeprazole (NEXIUM) 20 MG capsule      Take 2 capsules (40 mg total) by mouth daily before breakfast.    Take 2 capsules (40 mg total) by mouth daily before breakfast.   IBANDRONATE (BONIVA) 150 MG TABLET ibandronate (BONIVA) 150 MG tablet      Take 1 tablet (150 mg total) by mouth every 30 (thirty) days. Take in the morning with a full glass of water, on an empty stomach, and do not take anything else by mouth or lie down for the next 30 min.    Take 1 tablet (150 mg total) by mouth every 30 (thirty) days. Take in the morning with a full glass of water, on an empty stomach, and do not take anything else by mouth or lie down for the next 30 min.   MIRTAZAPINE (REMERON) 15 MG TABLET mirtazapine (REMERON) 15 MG tablet      Take 1 tablet (15 mg total) by mouth at bedtime.    Take 1 tablet (15 mg total) by mouth at bedtime.   SERTRALINE (ZOLOFT) 50 MG TABLET sertraline (ZOLOFT) 50 MG tablet      Take 1 tablet (50 mg total) by mouth daily.    Take 50 mg by mouth daily.   SIMVASTATIN (ZOCOR) 20 MG TABLET simvastatin (ZOCOR) 20 MG tablet      Take 0.5 tablets (10 mg total) by mouth daily.    Take 10 mg by mouth daily.    Planned Follow Up No follow-ups on file.   Total Time in Face-to-Face and in Coordination of Care for patient including independent/personal interpretation/review of prior testing, medical history, examination, medication adjustment, communicating results with the patient directly, and documentation within the EHR is 70 minutes.   Corliss Parish, MD  Gastroenterology Advanced Endoscopy Office # [phone]

## 2023-01-08 NOTE — Patient Instructions (Addendum)
We think you have Crohn's disease based on your history you give and your imaging. However, you have Shortness of breath when you exert yourself so before a Colonoscopy/EGD you need PCP and Cardiac evaluation.  You will start Budesonide 9 mg daily and continue this.   You will do a stool test to see inflammation in the body/GI tract.  Your provider has requested that you go to the basement level for lab work before leaving today. Press "B" on the elevator. The lab is located at the first door on the left as you exit the elevator.  You have been referred to Primary Care and Cardiology. If you have not heard from their office in 1-2  weeks, please contact us at [phone].   We have sent the following medications to your pharmacy for you to pick up at your convenience: All medications have been refilled once. You will need to est care with PCP asap. Referral has been sent.   It has been recommended to you by your physician that you have a(n) Colon /Endo  completed. Per your request, we did not schedule the procedure(s) today. Please contact our office at [phone] should you decide to have the procedure completed. You will be scheduled for a pre-visit and procedure at that time.   Due to recent changes in healthcare laws, you may see the results of your imaging and laboratory studies on MyChart before your provider has had a chance to review them.  We understand that in some cases there may be results that are confusing or concerning to you. Not all laboratory results come back in the same time frame and the provider may be waiting for multiple results in order to interpret others.  Please give [place] 48 hours in order for your provider to thoroughly review all the results before contacting the office for clarification of your results.   _______________________________________________________  If your blood pressure at your visit was 140/90 or greater, please contact your primary care physician  to follow up on this.  _______________________________________________________  If you are age 61 or older, your body mass index should be between 23-30. Your Body mass index is 28.25 kg/m. If this is out of the aforementioned range listed, please consider follow up with your Primary Care Provider.  If you are age 61 or younger, your body mass index should be between 19-25. Your Body mass index is 28.25 kg/m. If this is out of the aformentioned range listed, please consider follow up with your Primary Care Provider.   ________________________________________________________  The [facility] providers would like to encourage you to use [facility] to communicate with providers for non-urgent requests or questions.  Due to long hold times on the telephone, sending your provider a message by [facility] may be a faster and more efficient way to get a response.  Please allow 48 business hours for a response.  Please remember that this is for non-urgent requests.  _______________________________________________________  Thank you for choosing me and [facility].  Dr. Meridee Score

## 2023-01-11 ENCOUNTER — Other Ambulatory Visit: Payer: Self-pay | Admitting: Gastroenterology

## 2023-01-13 LAB — HEPATITIS B SURFACE ANTIBODY,QUALITATIVE: Hep B S Ab: REACTIVE — AB

## 2023-01-13 LAB — HIV ANTIBODY (ROUTINE TESTING W REFLEX): HIV 1&2 Ab, 4th Generation: NONREACTIVE

## 2023-01-13 LAB — HEPATITIS C ANTIBODY: Hepatitis C Ab: NONREACTIVE

## 2023-01-13 LAB — HEPATITIS B CORE ANTIBODY, TOTAL: Hep B Core Total Ab: NONREACTIVE

## 2023-01-13 LAB — IGA: Immunoglobulin A: 182 mg/dL (ref 70–320)

## 2023-01-13 LAB — QUANTIFERON-TB GOLD PLUS
Mitogen-NIL: 9.28 [IU]/mL
NIL: 0.02 [IU]/mL
QuantiFERON-TB Gold Plus: NEGATIVE
TB1-NIL: 0 [IU]/mL
TB2-NIL: 0 [IU]/mL

## 2023-01-13 LAB — HEPATITIS A ANTIBODY, TOTAL: Hepatitis A AB,Total: REACTIVE — AB

## 2023-01-13 LAB — THIOPURINE METHYLTRANSFERASE (TPMT), RBC

## 2023-01-13 LAB — HEPATITIS B SURFACE ANTIGEN: Hepatitis B Surface Ag: NONREACTIVE

## 2023-01-13 LAB — TISSUE TRANSGLUTAMINASE, IGA: (tTG) Ab, IgA: <1 U/mL

## 2023-01-13 MED ORDER — IBANDRONATE SODIUM 150 MG PO TABS: 150.0000 mg | ORAL_TABLET | ORAL | 0 refills | Status: DC

## 2023-01-16 ENCOUNTER — Ambulatory Visit (HOSPITAL_BASED_OUTPATIENT_CLINIC_OR_DEPARTMENT_OTHER)
Admission: RE | Admit: 2023-01-16 | Discharge: 2023-01-16 | Disposition: A | Discharge status: Home / Self Care | Payer: 59 | Source: Ambulatory Visit | Attending: Gastroenterology | Admitting: Gastroenterology

## 2023-01-16 ENCOUNTER — Other Ambulatory Visit (HOSPITAL_BASED_OUTPATIENT_CLINIC_OR_DEPARTMENT_OTHER): Payer: Self-pay

## 2023-01-16 DIAGNOSIS — Z1231 Encounter for screening mammogram for malignant neoplasm of breast: Secondary | ICD-10-CM | POA: Diagnosis not present

## 2023-01-21 ENCOUNTER — Other Ambulatory Visit: Payer: 59

## 2023-01-21 DIAGNOSIS — I10 Essential (primary) hypertension: Secondary | ICD-10-CM | POA: Diagnosis not present

## 2023-01-21 DIAGNOSIS — R0609 Other forms of dyspnea: Secondary | ICD-10-CM | POA: Diagnosis not present

## 2023-01-21 DIAGNOSIS — K219 Gastro-esophageal reflux disease without esophagitis: Secondary | ICD-10-CM | POA: Diagnosis not present

## 2023-01-21 DIAGNOSIS — K5000 Crohn's disease of small intestine without complications: Secondary | ICD-10-CM

## 2023-01-21 DIAGNOSIS — R0602 Shortness of breath: Secondary | ICD-10-CM

## 2023-01-21 DIAGNOSIS — M810 Age-related osteoporosis without current pathological fracture: Secondary | ICD-10-CM

## 2023-01-21 DIAGNOSIS — E785 Hyperlipidemia, unspecified: Secondary | ICD-10-CM | POA: Diagnosis not present

## 2023-01-23 ENCOUNTER — Other Ambulatory Visit: Payer: Self-pay | Admitting: Gastroenterology

## 2023-01-23 LAB — CALPROTECTIN, FECAL: Calprotectin, Fecal: 454 ug/g — ABNORMAL HIGH (ref 0–120)

## 2023-01-24 ENCOUNTER — Other Ambulatory Visit (HOSPITAL_COMMUNITY): Payer: Self-pay

## 2023-01-24 ENCOUNTER — Other Ambulatory Visit: Payer: Self-pay

## 2023-01-24 DIAGNOSIS — D84821 Immunodeficiency due to drugs: Secondary | ICD-10-CM

## 2023-01-24 DIAGNOSIS — K5000 Crohn's disease of small intestine without complications: Secondary | ICD-10-CM

## 2023-01-24 DIAGNOSIS — K219 Gastro-esophageal reflux disease without esophagitis: Secondary | ICD-10-CM

## 2023-01-24 MED ORDER — ASPIRIN 81 MG PO TBEC
81.0000 mg | DELAYED_RELEASE_TABLET | Freq: Every day | ORAL | 0 refills | Status: AC
  Filled 2023-01-24 – 2023-02-21 (×2): qty 30, 30d supply, fill #0

## 2023-01-24 MED ORDER — VITAMIN D3 125 MCG (5000 UT) PO CAPS
1.0000 | ORAL_CAPSULE | Freq: Every day | ORAL | 0 refills | Status: AC
  Filled 2023-01-24 – 2023-02-21 (×2): qty 30, 30d supply, fill #0

## 2023-01-24 MED ORDER — AZATHIOPRINE 50 MG PO TABS
50.0000 mg | ORAL_TABLET | Freq: Every day | ORAL | 0 refills | Status: DC
  Filled 2023-01-24 – 2023-02-02 (×2): qty 30, 30d supply, fill #0

## 2023-01-24 MED ORDER — MIRTAZAPINE 15 MG PO TABS
15.0000 mg | ORAL_TABLET | Freq: Every day | ORAL | 0 refills | Status: DC
  Filled 2023-01-24 – 2023-02-21 (×2): qty 30, 30d supply, fill #0

## 2023-01-24 MED ORDER — IBANDRONATE SODIUM 150 MG PO TABS
150.0000 mg | ORAL_TABLET | ORAL | 0 refills | Status: DC
  Filled 2023-01-24 – 2023-02-21 (×2): qty 1, 30d supply, fill #0

## 2023-01-24 MED ORDER — CALCIUM CARBONATE ANTACID 500 MG PO CHEW
1.0000 | CHEWABLE_TABLET | Freq: Every day | ORAL | 0 refills | Status: AC
  Filled 2023-01-24 – 2023-02-21 (×2): qty 30, 30d supply, fill #0

## 2023-01-24 MED ORDER — SERTRALINE HCL 50 MG PO TABS
50.0000 mg | ORAL_TABLET | Freq: Every day | ORAL | 0 refills | Status: DC
  Filled 2023-01-24 – 2023-02-21 (×2): qty 30, 30d supply, fill #0

## 2023-01-24 MED ORDER — CANDESARTAN CILEXETIL 8 MG PO TABS
8.0000 mg | ORAL_TABLET | Freq: Every day | ORAL | 0 refills | Status: DC
  Filled 2023-01-24: qty 30, 30d supply, fill #0

## 2023-01-24 NOTE — Telephone Encounter (Signed)
Patient has upcoming appointment to establish care with Dr. Edwina Barth. Note has been placed to pharmacy to please send all future refills to her PCP. Patient's cousin has also been informed of this.

## 2023-01-25 ENCOUNTER — Other Ambulatory Visit (HOSPITAL_COMMUNITY): Payer: Self-pay

## 2023-01-28 ENCOUNTER — Other Ambulatory Visit: Payer: Self-pay

## 2023-01-28 ENCOUNTER — Encounter: Payer: Self-pay | Admitting: Pharmacist

## 2023-01-29 ENCOUNTER — Other Ambulatory Visit: Payer: Self-pay

## 2023-01-30 ENCOUNTER — Telehealth: Payer: Self-pay | Admitting: Gastroenterology

## 2023-01-30 ENCOUNTER — Other Ambulatory Visit: Payer: Self-pay | Admitting: Gastroenterology

## 2023-01-30 NOTE — Telephone Encounter (Signed)
I spoke with the pt sister and made her aware that she needs to speak with her PCP in regards to pain and burning with urination.  The pt has been advised of the information and verbalized understanding.

## 2023-01-30 NOTE — Telephone Encounter (Signed)
Inbound call from patient's sister stating patient has been in a lot of pain and having a burning sensation when she uses the bathroom. Requesting a call back to discuss further. Wishing to also discuss recent lab results. Please advise, thank you.

## 2023-01-31 ENCOUNTER — Emergency Department (HOSPITAL_COMMUNITY): Payer: 59

## 2023-01-31 ENCOUNTER — Other Ambulatory Visit: Payer: Self-pay

## 2023-01-31 ENCOUNTER — Encounter (HOSPITAL_COMMUNITY): Payer: Self-pay

## 2023-01-31 ENCOUNTER — Emergency Department (HOSPITAL_COMMUNITY)
Admission: EM | Admit: 2023-01-31 | Discharge: 2023-01-31 | Disposition: A | Discharge status: Home / Self Care | Payer: 59 | Attending: Emergency Medicine | Admitting: Emergency Medicine

## 2023-01-31 DIAGNOSIS — Z7982 Long term (current) use of aspirin: Secondary | ICD-10-CM | POA: Insufficient documentation

## 2023-01-31 DIAGNOSIS — R1030 Lower abdominal pain, unspecified: Secondary | ICD-10-CM | POA: Diagnosis not present

## 2023-01-31 DIAGNOSIS — N3001 Acute cystitis with hematuria: Secondary | ICD-10-CM | POA: Diagnosis not present

## 2023-01-31 DIAGNOSIS — N3289 Other specified disorders of bladder: Secondary | ICD-10-CM | POA: Diagnosis not present

## 2023-01-31 DIAGNOSIS — R161 Splenomegaly, not elsewhere classified: Secondary | ICD-10-CM | POA: Diagnosis not present

## 2023-01-31 DIAGNOSIS — K5730 Diverticulosis of large intestine without perforation or abscess without bleeding: Secondary | ICD-10-CM | POA: Diagnosis not present

## 2023-01-31 LAB — CBC WITH DIFFERENTIAL/PLATELET
Abs Immature Granulocytes: 0.03 10*3/uL (ref 0.00–0.07)
Basophils Absolute: 0 10*3/uL (ref 0.0–0.1)
Basophils Relative: 0 %
Eosinophils Absolute: 0.1 10*3/uL (ref 0.0–0.5)
Eosinophils Relative: 1 %
HCT: 38.8 % (ref 36.0–46.0)
Hemoglobin: 12.7 g/dL (ref 12.0–15.0)
Immature Granulocytes: 0 %
Lymphocytes Relative: 5 %
Lymphs Abs: 0.4 10*3/uL — ABNORMAL LOW (ref 0.7–4.0)
MCH: 30 pg (ref 26.0–34.0)
MCHC: 32.7 g/dL (ref 30.0–36.0)
MCV: 91.7 fL (ref 80.0–100.0)
Monocytes Absolute: 0.6 10*3/uL (ref 0.1–1.0)
Monocytes Relative: 7 %
Neutro Abs: 6.8 10*3/uL (ref 1.7–7.7)
Neutrophils Relative %: 87 %
Platelets: 144 10*3/uL — ABNORMAL LOW (ref 150–400)
RBC: 4.23 MIL/uL (ref 3.87–5.11)
RDW: 13.9 % (ref 11.5–15.5)
WBC: 7.8 10*3/uL (ref 4.0–10.5)
nRBC: 0 % (ref 0.0–0.2)

## 2023-01-31 LAB — URINALYSIS, W/ REFLEX TO CULTURE (INFECTION SUSPECTED)
Bilirubin Urine: NEGATIVE
Glucose, UA: NEGATIVE mg/dL
Ketones, ur: NEGATIVE mg/dL
Nitrite: NEGATIVE
Protein, ur: NEGATIVE mg/dL
Specific Gravity, Urine: 1.01 (ref 1.005–1.030)
pH: 8 (ref 5.0–8.0)

## 2023-01-31 LAB — COMPREHENSIVE METABOLIC PANEL
ALT: 15 U/L (ref 0–44)
AST: 15 U/L (ref 15–41)
Albumin: 3.8 g/dL (ref 3.5–5.0)
Alkaline Phosphatase: 90 U/L (ref 38–126)
Anion gap: 9 (ref 5–15)
BUN: 9 mg/dL (ref 8–23)
CO2: 25 mmol/L (ref 22–32)
Calcium: 8.9 mg/dL (ref 8.9–10.3)
Chloride: 105 mmol/L (ref 98–111)
Creatinine, Ser: 0.54 mg/dL (ref 0.44–1.00)
GFR, Estimated: >60 mL/min (ref >60)
Glucose, Bld: 98 mg/dL (ref 70–99)
Potassium: 3.6 mmol/L (ref 3.5–5.1)
Sodium: 139 mmol/L (ref 135–145)
Total Bilirubin: 0.9 mg/dL (ref 0.3–1.2)
Total Protein: 6.6 g/dL (ref 6.5–8.1)

## 2023-01-31 LAB — LIPASE, BLOOD: Lipase: 26 U/L (ref 11–51)

## 2023-01-31 MED ORDER — IOHEXOL 300 MG/ML  SOLN
100.0000 mL | Freq: Once | INTRAMUSCULAR | Status: AC | PRN
  Administered 2023-01-31: 100 mL via INTRAVENOUS

## 2023-01-31 MED ORDER — NITROFURANTOIN MONOHYD MACRO 100 MG PO CAPS: 100.0000 mg | ORAL_CAPSULE | Freq: Two times a day (BID) | ORAL | 0 refills | Status: DC

## 2023-01-31 NOTE — ED Provider Notes (Signed)
[place] EMERGENCY DEPARTMENT AT [facility] Provider Note   CSN: [id number] Arrival date & time: 01/31/23  4098     History  Chief Complaint  Patient presents with   Pelvic Pain    Lindsey Massey is a 62 y.o. female.  HPI 62 year old female with a history of diverticulitis and Crohn's disease presents with lower abdominal pain and hematuria.  History is obtained with the family member interpreting per patient preference.  Patient's been dealing with about 2 weeks of lower abdominal pain.  Seems to come and go and is sharp.  Over the last several days has been having some dysuria.  However this morning started having blood in her urine but she thinks also some blood from her vagina.  She had diarrhea couple days ago but otherwise this was a one-time thing and she has not had any blood in the stools.  No fevers.  The pain is also present in her low back. Patient is not sexually active.  Home Medications Prior to Admission medications   Medication Sig Start Date End Date Taking? Authorizing Provider  nitrofurantoin, macrocrystal-monohydrate, (MACROBID) 100 MG capsule Take 1 capsule (100 mg total) by mouth 2 (two) times daily. 01/31/23  Yes Pricilla Loveless, MD  ALPRAZolam Prudy Feeler) 0.5 MG tablet Take 0.5 mg by mouth at bedtime as needed for anxiety.    [provider]  amoxicillin-clavulanate (AUGMENTIN) 875-125 MG tablet Take 1 tablet by mouth every 12 (twelve) hours. 01/02/23   Vanetta Mulders, MD  aspirin EC 81 MG tablet Take 1 tablet (81 mg total) by mouth daily. Swallow whole. 01/24/23   Mansouraty, Netty Starring., MD  azaTHIOprine (IMURAN) 50 MG tablet Take 1 tablet (50 mg total) by mouth daily. 01/24/23   Mansouraty, Netty Starring., MD  budesonide (ENTOCORT EC) 3 MG 24 hr capsule Take 3 capsules (9 mg total) by mouth daily. 01/08/23   Mansouraty, Netty Starring., MD  calcium carbonate (TUMS - DOSED IN MG ELEMENTAL CALCIUM) 500 MG chewable tablet Chew 1 tablet (200 mg of  elemental calcium total) by mouth daily. 01/24/23   Mansouraty, Netty Starring., MD  candesartan (ATACAND) 8 MG tablet Take 1 tablet (8 mg total) by mouth daily. 01/24/23   Mansouraty, Netty Starring., MD  Cholecalciferol (VITAMIN D3) 125 MCG (5000 UT) CAPS Take 1 capsule (5,000 Units total) by mouth daily. 01/24/23   Mansouraty, Netty Starring., MD  diltiazem (CARDIZEM) 90 MG tablet Take 1 tablet (90 mg total) by mouth 2 (two) times daily. 01/08/23   Mansouraty, Netty Starring., MD  doxazosin (CARDURA XL) 4 MG 24 hr tablet Take 1 tablet (4 mg total) by mouth daily with breakfast. 01/08/23   Mansouraty, Netty Starring., MD  esomeprazole (NEXIUM) 20 MG capsule Take 2 capsules (40 mg total) by mouth daily before breakfast. 01/08/23   Mansouraty, Netty Starring., MD  ibandronate (BONIVA) 150 MG tablet Take 1 tablet (150 mg total) by mouth every 30 (thirty) days. Take in the morning with a full glass of water, on an empty stomach, and do not take anything else by mouth or lie down for the next 30 min. 01/24/23   Mansouraty, Netty Starring., MD  mirtazapine (REMERON) 15 MG tablet Take 1 tablet (15 mg total) by mouth at bedtime. 01/24/23   Mansouraty, Netty Starring., MD  sertraline (ZOLOFT) 50 MG tablet Take 1 tablet (50 mg total) by mouth daily. 01/24/23   Mansouraty, Netty Starring., MD  simvastatin (ZOCOR) 20 MG tablet Take 0.5 tablets (10 mg total) by mouth  daily. 01/08/23   Mansouraty, Netty Starring., MD  traMADol (ULTRAM) 50 MG tablet Take 1 tablet (50 mg total) by mouth every 8 (eight) hours as needed. 09/15/14   Charlynne Pander, MD      Allergies    Patient has no known allergies.    Review of Systems   Review of Systems  Gastrointestinal:  Positive for abdominal pain. Negative for blood in stool and vomiting.  Genitourinary:  Positive for dysuria, hematuria and vaginal bleeding.  Musculoskeletal:  Positive for back pain.    Physical Exam Updated Vital Signs BP 109/75 (BP Location: Right Arm)   Pulse 70   Temp 98.1 F (36.7 C)  (Oral)   Resp 18   Ht 5\' 6"  (1.676 m)   Wt 80.7 kg   LMP 12/27/2010   SpO2 97%   BMI 28.73 kg/m  Physical Exam Vitals and nursing note reviewed. Exam conducted with a chaperone present.  Constitutional:      General: She is not in acute distress.    Appearance: She is well-developed. She is not ill-appearing or diaphoretic.  HENT:     Head: Normocephalic and atraumatic.  Cardiovascular:     Rate and Rhythm: Normal rate and regular rhythm.     Heart sounds: Normal heart sounds.  Pulmonary:     Effort: Pulmonary effort is normal.     Breath sounds: Normal breath sounds.  Abdominal:     Palpations: Abdomen is soft.     Tenderness: There is abdominal tenderness in the right lower quadrant, suprapubic area and left lower quadrant. There is no right CVA tenderness or left CVA tenderness.  Genitourinary:    Labia:        Right: No rash or lesion.        Left: No rash or lesion.      Vagina: No bleeding.     Comments: No obvious vaginal mass/bleeding Interpreter present (but unable to see patient) during exam to help with translation Skin:    General: Skin is warm and dry.  Neurological:     Mental Status: She is alert.     ED Results / Procedures / Treatments   Labs (all labs ordered are listed, but only abnormal results are displayed) Labs Reviewed  CBC WITH DIFFERENTIAL/PLATELET - Abnormal; Notable for the following components:      Result Value   Platelets 144 (*)    Lymphs Abs 0.4 (*)    All other components within normal limits  URINALYSIS, W/ REFLEX TO CULTURE (INFECTION SUSPECTED) - Abnormal; Notable for the following components:   Hgb urine dipstick MODERATE (*)    Leukocytes,Ua SMALL (*)    Bacteria, UA RARE (*)    All other components within normal limits  URINE CULTURE  COMPREHENSIVE METABOLIC PANEL  LIPASE, BLOOD    EKG None  Radiology CT ABDOMEN PELVIS W CONTRAST  Result Date: 01/31/2023 CLINICAL DATA:  Lower quadrant abdominal pain. EXAM: CT  ABDOMEN AND PELVIS WITH CONTRAST TECHNIQUE: Multidetector CT imaging of the abdomen and pelvis was performed using the standard protocol following bolus administration of intravenous contrast. RADIATION DOSE REDUCTION: This exam was performed according to the departmental dose-optimization program which includes automated exposure control, adjustment of the mA and/or kV according to patient size and/or use of iterative reconstruction technique. CONTRAST:  OMNIPAQUE IOHEXOL 300 MG/ML  SOLN COMPARISON:  CT 01/02/2023 FINDINGS: Lower chest: There is some linear opacity lung bases likely scar or atelectasis. No pleural effusion. Hepatobiliary: No focal  liver abnormality is seen. No gallstones, gallbladder wall thickening, or biliary dilatation. Patent portal vein. Pancreas: Unremarkable. No pancreatic ductal dilatation or surrounding inflammatory changes. Spleen: Spleen is enlarged with an AP length of 14.6 cm. Preserved enhancement. Adrenals/Urinary Tract: Adrenal glands are unremarkable. Kidneys are normal, without renal calculi, focal lesion, or hydronephrosis. There is slight wall thickening and stranding along the urinary bladder. Please correlate for any clinical evidence of cystitis. Stomach/Bowel: On this non oral contrast exam the stomach and small bowel are nondilated. As seen previously along the terminal ileum there is some mucosal hyperenhancement and mucosal thickening with slight adjacent inflammatory stranding and prominent surrounding fat. There is also some intramural fat along the terminal ileum and proximal colon. Please correlate for history of inflammatory bowel disease. No obstruction. Large bowel is of normal course and caliber with scattered stool. There are some scattered colonic diverticula particularly of the sigmoid colon. Vascular/Lymphatic: Aortic atherosclerosis. No enlarged abdominal or pelvic lymph nodes. Reproductive: Uterus and bilateral adnexa are unremarkable. Other: No  abdominal wall hernia or abnormality. No abdominopelvic ascites. Musculoskeletal: No acute or significant osseous findings. IMPRESSION: Slight wall thickening of the urinary bladder with some stranding. Please correlate for any clinical evidence of cystitis. Terminal ileum shows once again mucosal hyperenhancement and thickening with slight stranding and surrounding prominent fat. Please correlate for any known history of inflammatory bowel disease or Crohn disease. No obstruction or free air. Mild splenic enlargement. Electronically Signed   By: Karen Kays M.D.   On: 01/31/2023 12:03    Procedures Procedures    Medications Ordered in ED Medications  iohexol (OMNIPAQUE) 300 MG/ML solution 100 mL (100 mLs Intravenous Contrast Given 01/31/23 1103)    ED Course/ Medical Decision Making/ A&P                                 Medical Decision Making Amount and/or Complexity of Data Reviewed Labs: ordered.    Details: Normal WBC. Unremarkable CMP. Radiology: ordered and independent interpretation performed.    Details: No acute inflammation/infection (besides possible cystitis)  Risk Prescription drug management.   Patient's workup including CBC, CMP and CT are unremarkable.  Pelvic exam shows no bleeding.  No concerning findings on her uterus on CT.  Urine is positive for UTI.  Seems to be cystitis.  Doubt pyelonephritis.  Will put on Macrobid and have her follow-up with her PCP (has not seen them yet but has an appointment scheduled) and will have her follow-up with gynecology.  I discussed all of this through the interpreter and while I think the blood she noticed was from the urine, I think she still needs should follow-up with gynecology given the possibility of post menopausal bleeding.  Will discharge home with return precautions.        Final Clinical Impression(s) / ED Diagnoses Final diagnoses:  Acute cystitis with hematuria    Rx / DC Orders ED Discharge Orders           Ordered    nitrofurantoin, macrocrystal-monohydrate, (MACROBID) 100 MG capsule  2 times daily        01/31/23 1506              Pricilla Loveless, MD 01/31/23 [phone]

## 2023-01-31 NOTE — ED Triage Notes (Signed)
Patient reports pelvic pain, nausea, and vomiting x 3 weeks. Report vaginal bleeding and possible blood in urine starting today. Patient reports she has been postmenopausal for several years. Patient has hx of diverticulitis and was recently on abx for this.

## 2023-01-31 NOTE — Discharge Instructions (Addendum)
We are putting you on 5 days of antibiotics for a bladder infection.  Follow-up with your primary care physician but also follow-up with your gynecologist due to possible vaginal bleeding.  If you develop worsening, continued, or recurrent abdominal pain, uncontrolled vomiting, fever, chest or back pain, or any other new/concerning symptoms then return to the ER for evaluation.   ?????? ?????? ?????? ???? 5 ???? ????? ???? ???????.  ?? ????????? ?? ???? ??????? ??????? ????? ?? ???? ????? ?? ????????? ?? ???? ????? ?????? ????? ?? ???? ?????? ???? ???? ?????.  ??? ??? ????? ?? ???? ?? ????? ??????? ?? ?????? ?? ??????? ?? ??? ??? ?????? ?? ???? ?? ???? ?? ????? ?? ?????? ?? ?? ????? ???? ?????/????? ?????? ????? ??? ???? ??????? ???????. sanuetik mdadan hywyan limudat 5 'ayaam lieilaj eadwaa almathanati. qum bialmutabaeat mae tabib alrieayat al'awaliat alkhasi bik walakin aydan qum bialmutabaeat mae tabib '[place]' alkhasi bik bisabab aihtimal huduth nazif mihbli. 'iidha kunt tueani min alam fi albatn mutafaqimat 'aw mustamirat 'aw mutakariratun, 'aw qay' ghayr mundabiti, 'aw himaa, 'aw alam fi alsadr 'aw alzahra, 'aw 'ayi 'aerad 'ukhraa jadidatin/muthirat lilqalaqi, farjie 'iilaa ghurfat altawari liltaqyimi.

## 2023-02-01 ENCOUNTER — Other Ambulatory Visit: Payer: Self-pay | Admitting: Gastroenterology

## 2023-02-02 ENCOUNTER — Other Ambulatory Visit: Payer: Self-pay | Admitting: Gastroenterology

## 2023-02-02 LAB — URINE CULTURE: Culture: <10000 — AB

## 2023-02-03 ENCOUNTER — Other Ambulatory Visit: Payer: Self-pay

## 2023-02-03 ENCOUNTER — Other Ambulatory Visit (HOSPITAL_COMMUNITY): Payer: Self-pay

## 2023-02-04 ENCOUNTER — Telehealth: Payer: Self-pay | Admitting: Pharmacy Technician

## 2023-02-04 ENCOUNTER — Other Ambulatory Visit (HOSPITAL_COMMUNITY): Payer: Self-pay

## 2023-02-04 NOTE — Telephone Encounter (Signed)
Pharmacy Patient Advocate Encounter   Received notification from CoverMyMeds that prior authorization for CARDURA XL 4MG  is required/requested.   Insurance verification completed.   The patient is insured through [facility] .   Per test claim: PA required; PA submitted to [facility] via CoverMyMeds Key/confirmation #/[facility] Status is pending

## 2023-02-05 ENCOUNTER — Other Ambulatory Visit: Payer: Self-pay | Admitting: Gastroenterology

## 2023-02-06 ENCOUNTER — Other Ambulatory Visit: Payer: Self-pay | Admitting: Gastroenterology

## 2023-02-06 ENCOUNTER — Other Ambulatory Visit: Payer: Self-pay

## 2023-02-11 ENCOUNTER — Other Ambulatory Visit: Payer: Self-pay | Admitting: Gastroenterology

## 2023-02-13 ENCOUNTER — Other Ambulatory Visit (HOSPITAL_COMMUNITY): Payer: Self-pay

## 2023-02-13 ENCOUNTER — Other Ambulatory Visit: Payer: Self-pay | Admitting: Gastroenterology

## 2023-02-13 NOTE — Telephone Encounter (Signed)
Pharmacy Patient Advocate Encounter  Received notification from [facility] that Prior Authorization for Cardura XL 4MG  er tablets has been APPROVED from 02/03/2023 to 02/03/2024. Ran test claim, Copay is $4.00. This test claim was processed through [facility]- copay amounts may vary at other pharmacies due to pharmacy/plan contracts, or as the patient moves through the different stages of their insurance plan.   PA #/Case ID/Reference #: [id number]

## 2023-02-14 MED ORDER — SIMVASTATIN 20 MG PO TABS: 10.0000 mg | ORAL_TABLET | Freq: Every day | ORAL | 0 refills | Status: DC

## 2023-02-21 ENCOUNTER — Encounter (INDEPENDENT_AMBULATORY_CARE_PROVIDER_SITE_OTHER): Payer: Self-pay

## 2023-02-21 ENCOUNTER — Other Ambulatory Visit: Payer: Self-pay | Admitting: Gastroenterology

## 2023-02-21 ENCOUNTER — Other Ambulatory Visit (HOSPITAL_COMMUNITY): Payer: Self-pay

## 2023-02-21 ENCOUNTER — Other Ambulatory Visit: Payer: Self-pay

## 2023-02-23 NOTE — Progress Notes (Unsigned)
Cardiology Office Note:    Date:  02/26/2023   ID:  Lindsey Massey, DOB December 26, 1960, MRN [id number]  PCP:  Mansouraty, Netty Starring., MD  Cardiologist:  None  Electrophysiologist:  None   Referring MD: [name].*   Chief Complaint  Patient presents with   Chest Pain   Shortness of Breath    History of Present Illness:    Lindsey Massey is a 62 y.o. female with a hx of Crohn's disease, hypertension, hyperlipidemia, tobacco use who is referred by Dr. Meridee Score for evaluation of shortness of breath.  She reports that she feels short of breath with exertion, particularly walking uphill.  Reports also having chest pain.  States that occurs about once per week, describes as feeling of discomfort in chest that lasts up to 10 minutes.  Occur with exertion but also can happen at rest.  Reports some lightheadedness but denies any syncope.  Does report some swelling in her legs.  She has smoked for over 20 years, currently smoking 2 to 5 cigarettes/day.  Family history includes brother had PCI at age 58.    Past Medical History:  Diagnosis Date   Crohn disease (HCC)    Depressed    GERD (gastroesophageal reflux disease)    High cholesterol    Hypercholesteremia    Hypertension    Osteoporosis    Shortness of breath    sometimes before sleep   Uterine fibroid     Past Surgical History:  Procedure Laterality Date   TONSILLECTOMY     age 47   TONSILLECTOMY AND ADENOIDECTOMY      Current Medications: Current Meds  Medication Sig   aspirin EC 81 MG tablet Take 1 tablet (81 mg total) by mouth daily. Swallow whole.   azaTHIOprine (IMURAN) 50 MG tablet TAKE 1 TABLET BY MOUTH EVERY DAY   candesartan (ATACAND) 8 MG tablet TAKE 1 TABLET BY MOUTH EVERY DAY   Cholecalciferol (VITAMIN D3) 125 MCG (5000 UT) CAPS Take 1 capsule (5,000 Units total) by mouth daily.   diltiazem (CARDIZEM) 90 MG tablet Take 1 tablet (90 mg total) by mouth 2 (two) times daily.   esomeprazole (NEXIUM)  20 MG capsule Take 2 capsules (40 mg total) by mouth daily before breakfast.   ibandronate (BONIVA) 150 MG tablet Take 1 tablet (150 mg total) by mouth every 30 (thirty) days. Take in the morning with a full glass of water, on an empty stomach, and do not take anything else by mouth or lie down for the next 30 min.   metoprolol tartrate (LOPRESSOR) 100 MG tablet Take 1 tablet (100 mg total) by mouth 2 (two) times daily.   mirtazapine (REMERON) 15 MG tablet Take 1 tablet (15 mg total) by mouth at bedtime.   sertraline (ZOLOFT) 50 MG tablet Take 1 tablet (50 mg total) by mouth daily.   simvastatin (ZOCOR) 20 MG tablet Take 0.5 tablets (10 mg total) by mouth daily.   traMADol (ULTRAM) 50 MG tablet Take 1 tablet (50 mg total) by mouth every 8 (eight) hours as needed.     Allergies:   Patient has no known allergies.   Social History   Socioeconomic History   Marital status: Single    Spouse name: Not on file   Number of children: Not on file   Years of education: Not on file   Highest education level: Not on file  Occupational History   Not on file  Tobacco Use   Smoking status: Every Day  Current packs/day: 0.30    Types: Cigarettes   Smokeless tobacco: Not on file  Vaping Use   Vaping status: Never Used  Substance and Sexual Activity   Alcohol use: No   Drug use: No   Sexual activity: Never  Other Topics Concern   Not on file  Social History Narrative   Lives with sister and nephews (18,16) in [place]. Moved from [place] in April 2012. Separated from husband. No children. Does not work.      Balanced diet. Little exercise.      Christian faith.   Social Determinants of Health   Financial Resource Strain: Not on file  Food Insecurity: Not on file  Transportation Needs: Not on file  Physical Activity: Not on file  Stress: Not on file  Social Connections: Not on file     Family History: The patient's family history includes Arthritis in her sister; Cancer in her  father; Heart disease in her mother and paternal uncle; Hypertension in her brother, father, and mother. There is no history of Anesthesia problems, Colon cancer, Esophageal cancer, Inflammatory bowel disease, Liver disease, Pancreatic cancer, Rectal cancer, or Stomach cancer.  ROS:   Please see the history of present illness.     All other systems reviewed and are negative.  EKGs/Labs/Other Studies Reviewed:    The following studies were reviewed today:   EKG:   02/26/2023: Normal sinus rhythm, rate 70, no ST abnormalities  Recent Labs: 01/08/2023: TSH 3.34 01/31/2023: ALT 15; BUN 9; Creatinine, Ser 0.54; Hemoglobin 12.7; Platelets 144; Potassium 3.6; Sodium 139  Recent Lipid Panel    Component Value Date/Time   CHOL 206 (H) 09/16/2011 1425   TRIG 97 09/16/2011 1425   HDL 55 09/16/2011 1425   CHOLHDL 3.7 09/16/2011 1425   VLDL 19 09/16/2011 1425   LDLCALC 132 (H) 09/16/2011 1425   LDLDIRECT 123 (H) 01/24/2012 1602    Physical Exam:    VS:  BP 122/80 (BP Location: Right Arm, Patient Position: Sitting, Cuff Size: Normal)   Pulse 71   Ht 5\' 6"  (1.676 m)   Wt 176 lb (79.8 kg)   LMP 12/27/2010   SpO2 94%   BMI 28.41 kg/m     Wt Readings from Last 3 Encounters:  02/26/23 176 lb (79.8 kg)  01/31/23 178 lb (80.7 kg)  01/08/23 175 lb (79.4 kg)     GEN:  Well nourished, well developed in no acute distress HEENT: Normal NECK: No JVD; No carotid bruits LYMPHATICS: No lymphadenopathy CARDIAC: RRR, no murmurs, rubs, gallops RESPIRATORY:  Clear to auscultation without rales, wheezing or rhonchi  ABDOMEN: Soft, non-tender, non-distended MUSCULOSKELETAL:  No edema; No deformity  SKIN: Warm and dry NEUROLOGIC:  Alert and oriented x 3 PSYCHIATRIC:  Normal affect   ASSESSMENT:    1. Chest pain, unspecified type   2. DOE (dyspnea on exertion)   3. Essential hypertension   4. Hyperlipidemia, unspecified hyperlipidemia type   5. Tobacco use    PLAN:    Chest  pain/Shortness of breath: Chest pain atypical in description but also having dyspnea on exertion that could represent anginal equivalent.  Has multiple CAD risk factors (age, hypertension, hyperlipidemia, tobacco use).   -Recommend coronary CTA to evaluate for obstructive CAD.  On day of study will hold her home diltiazem and give Lopressor 100 mg prior to study -Check echocardiogram to rule out structural heart disease  Preop evaluation: Planning colonoscopy to evaluate her Crohn's disease.  She is reporting chest pain and  dyspnea as above, plan coronary CTA and echocardiogram prior to procedure as above  Hypertension: On candesartan 8 mg daily, diltiazem 90 mg twice daily, doxazosin 4 mg daily.  Appears controlled  Hyperlipidemia: On simvastatin 10 mg daily.  Check lipid panel  Tobacco use: Counseled on the risk of tobacco use and cessation strongly encouraged  RTC in 4 months   Medication Adjustments/Labs and Tests Ordered: Current medicines are reviewed at length with the patient today.  Concerns regarding medicines are outlined above.  Orders Placed This Encounter  Procedures   CT CORONARY MORPH W/CTA COR W/SCORE W/CA W/CM &/OR WO/CM   Basic Metabolic Panel (BMET)   Lipid panel   EKG 12-Lead   ECHOCARDIOGRAM COMPLETE   Meds ordered this encounter  Medications   metoprolol tartrate (LOPRESSOR) 100 MG tablet    Sig: Take 1 tablet (100 mg total) by mouth 2 (two) times daily.    Dispense:  1 tablet    Refill:  0    Patient Instructions  Medication Instructions:  Continue all current medications *If you need a refill on your cardiac medications before your next appointment, please call your pharmacy*   Lab Work: BMET, Lipid  panel today  If you have labs (blood work) drawn today and your tests are completely normal, you will receive your results only by: MyChart Message (if you have MyChart) OR A paper copy in the mail If you have any lab test that is abnormal or we need  to change your treatment, we will call you to review the results.   Testing/Procedures: Echo  Your physician has requested that you have an echocardiogram. Echocardiography is a painless test that uses sound waves to create images of your heart. It provides your doctor with information about the size and shape of your heart and how well your heart's chambers and valves are working. This procedure takes approximately one hour. There are no restrictions for this procedure. Please do NOT wear cologne, perfume, aftershave, or lotions (deodorant is allowed). Please arrive 15 minutes prior to your appointment time.      Your cardiac CT will be scheduled at one of the below locations:   [facility] [address] [place], [place] [address] [phone]   If scheduled at [facility], please arrive at the [facility] and Children's Entrance (Entrance C2) of [facility] 30 minutes prior to test start time. You can use the FREE valet parking offered at entrance C (encouraged to control the heart rate for the test)  Proceed to the [facility] Radiology Department (first floor) to check-in and test prep.  All radiology patients and guests should use entrance C2 at [facility], accessed from [facility], even though the hospital's physical address listed is [address]    There is spacious parking and easy access to the radiology department from the [facility] Heart and Vascular entrance. Please enter here and check-in with the desk attendant.   Please follow these instructions carefully (unless otherwise directed):  An IV will be required for this test and Nitroglycerin will be given.     On the Night Before the Test: Be sure to Drink plenty of water. Do not consume any caffeinated/decaffeinated beverages or chocolate 12 hours prior to your test. Do not take any antihistamines 12 hours prior to your test. Drink plenty of water until 1  hour prior to the test. Do not eat any food 1 hour prior to test. You may  take your regular medications prior to the test.  Take metoprolol (Lopressor)  100 mg two hours prior to test. On morning of test DO NOT TAKE Diltiazem 90 mg tablet.  If you take Furosemide/Hydrochlorothiazide/Spironolactone, please HOLD on the morning of the test. FEMALES- please wear underwire-free bra if available, avoid dresses & tight clothing   Drink plenty of water. After receiving IV contrast, you may experience a mild flushed feeling. This is normal. On occasion, you may experience a mild rash up to 24 hours after the test. This is not dangerous. If this occurs, you can take Benadryl 25 mg and increase your fluid intake. If you experience trouble breathing, this can be serious. If it is severe call 911 IMMEDIATELY. If it is mild, please call our office. If you take any of these medications: Glipizide/Metformin, Avandament, Glucavance, please do not take 48 hours after completing test unless otherwise instructed.  We will call to schedule your test 2-4 weeks out understanding that some insurance companies will need an authorization prior to the service being performed.   For more information and frequently asked questions, please visit our website : [email or web address]  For non-scheduling related questions, please contact the cardiac imaging nurse navigator should you have any questions/concerns: Cardiac Imaging Nurse Navigators Direct Office Dial: [phone]   For scheduling needs, including cancellations and rescheduling, please call [place], [phone].   Please note: We ask at that you not bring children with you during ultrasound (echo/ vascular) testing. Due to room size and safety concerns, children are not allowed in the ultrasound rooms during exams. Our front office staff cannot provide observation of children in our lobby area while testing is being conducted. An adult accompanying  a patient to their appointment will only be allowed in the ultrasound room at the discretion of the ultrasound technician under special circumstances. We apologize for any inconvenience.    Follow-Up: At [facility], you and your health needs are our priority.  As part of our continuing mission to provide you with exceptional heart care, we have created designated Provider Care Teams.  These Care Teams include your primary Cardiologist (physician) and Advanced Practice Providers (APPs -  Physician Assistants and Nurse Practitioners) who all work together to provide you with the care you need, when you need it.  We recommend signing up for the patient portal called "MyChart".  Sign up information is provided on this After Visit Summary.  MyChart is used to connect with patients for Virtual Visits (Telemedicine).  Patients are able to view lab/test results, encounter notes, upcoming appointments, etc.  Non-urgent messages can be sent to your provider as well.   To learn more about what you can do with MyChart, go to [email or web address].    Your next appointment:   4 month(s)  Provider:    Dr.Flemon Kelty     Signed, Little Ishikawa, MD  02/26/2023 3:26 PM    [facility]

## 2023-02-24 ENCOUNTER — Other Ambulatory Visit (HOSPITAL_COMMUNITY): Payer: Self-pay

## 2023-02-26 ENCOUNTER — Other Ambulatory Visit: Payer: Self-pay | Admitting: Cardiology

## 2023-02-26 ENCOUNTER — Ambulatory Visit: Payer: 59 | Attending: Cardiology | Admitting: Cardiology

## 2023-02-26 ENCOUNTER — Encounter: Payer: Self-pay | Admitting: Cardiology

## 2023-02-26 VITALS — BP 122/80 | HR 71 | Ht 66.0 in | Wt 176.0 lb

## 2023-02-26 DIAGNOSIS — E785 Hyperlipidemia, unspecified: Secondary | ICD-10-CM

## 2023-02-26 DIAGNOSIS — R079 Chest pain, unspecified: Secondary | ICD-10-CM

## 2023-02-26 DIAGNOSIS — R0609 Other forms of dyspnea: Secondary | ICD-10-CM | POA: Diagnosis not present

## 2023-02-26 DIAGNOSIS — I10 Essential (primary) hypertension: Secondary | ICD-10-CM

## 2023-02-26 DIAGNOSIS — Z720 Tobacco use: Secondary | ICD-10-CM

## 2023-02-26 MED ORDER — METOPROLOL TARTRATE 100 MG PO TABS: 100.0000 mg | ORAL_TABLET | Freq: Two times a day (BID) | ORAL | 0 refills | Status: DC

## 2023-02-26 NOTE — Patient Instructions (Signed)
Medication Instructions:  Continue all current medications *If you need a refill on your cardiac medications before your next appointment, please call your pharmacy*   Lab Work: BMET, Lipid  panel today  If you have labs (blood work) drawn today and your tests are completely normal, you will receive your results only by: MyChart Message (if you have MyChart) OR A paper copy in the mail If you have any lab test that is abnormal or we need to change your treatment, we will call you to review the results.   Testing/Procedures: Echo  Your physician has requested that you have an echocardiogram. Echocardiography is a painless test that uses sound waves to create images of your heart. It provides your doctor with information about the size and shape of your heart and how well your heart's chambers and valves are working. This procedure takes approximately one hour. There are no restrictions for this procedure. Please do NOT wear cologne, perfume, aftershave, or lotions (deodorant is allowed). Please arrive 15 minutes prior to your appointment time.      Your cardiac CT will be scheduled at one of the below locations:   [facility] [address] [place], [place] [address] [phone]   If scheduled at [facility], please arrive at the [facility] and Children's Entrance (Entrance C2) of [facility] 30 minutes prior to test start time. You can use the FREE valet parking offered at entrance C (encouraged to control the heart rate for the test)  Proceed to the [facility] Radiology Department (first floor) to check-in and test prep.  All radiology patients and guests should use entrance C2 at [facility], accessed from [facility], even though the hospital's physical address listed is [address].    There is spacious parking and easy access to the radiology department from the [facility] Heart and Vascular entrance. Please enter here  and check-in with the desk attendant.   Please follow these instructions carefully (unless otherwise directed):  An IV will be required for this test and Nitroglycerin will be given.     On the Night Before the Test: Be sure to Drink plenty of water. Do not consume any caffeinated/decaffeinated beverages or chocolate 12 hours prior to your test. Do not take any antihistamines 12 hours prior to your test. Drink plenty of water until 1 hour prior to the test. Do not eat any food 1 hour prior to test. You may take your regular medications prior to the test.  Take metoprolol (Lopressor)  100 mg two hours prior to test. On morning of test DO NOT TAKE Diltiazem 90 mg tablet.  If you take Furosemide/Hydrochlorothiazide/Spironolactone, please HOLD on the morning of the test. FEMALES- please wear underwire-free bra if available, avoid dresses & tight clothing   Drink plenty of water. After receiving IV contrast, you may experience a mild flushed feeling. This is normal. On occasion, you may experience a mild rash up to 24 hours after the test. This is not dangerous. If this occurs, you can take Benadryl 25 mg and increase your fluid intake. If you experience trouble breathing, this can be serious. If it is severe call 911 IMMEDIATELY. If it is mild, please call our office. If you take any of these medications: Glipizide/Metformin, Avandament, Glucavance, please do not take 48 hours after completing test unless otherwise instructed.  We will call to schedule your test 2-4 weeks out understanding that some insurance companies will need an authorization  prior to the service being performed.   For more information and frequently asked questions, please visit our website : [email or web address]  For non-scheduling related questions, please contact the cardiac imaging nurse navigator should you have any questions/concerns: Cardiac Imaging Nurse Navigators Direct Office Dial: [phone]    For scheduling needs, including cancellations and rescheduling, please call [place], [phone].   Please note: We ask at that you not bring children with you during ultrasound (echo/ vascular) testing. Due to room size and safety concerns, children are not allowed in the ultrasound rooms during exams. Our front office staff cannot provide observation of children in our lobby area while testing is being conducted. An adult accompanying a patient to their appointment will only be allowed in the ultrasound room at the discretion of the ultrasound technician under special circumstances. We apologize for any inconvenience.    Follow-Up: At [facility], you and your health needs are our priority.  As part of our continuing mission to provide you with exceptional heart care, we have created designated Provider Care Teams.  These Care Teams include your primary Cardiologist (physician) and Advanced Practice Providers (APPs -  Physician Assistants and Nurse Practitioners) who all work together to provide you with the care you need, when you need it.  We recommend signing up for the patient portal called "MyChart".  Sign up information is provided on this After Visit Summary.  MyChart is used to connect with patients for Virtual Visits (Telemedicine).  Patients are able to view lab/test results, encounter notes, upcoming appointments, etc.  Non-urgent messages can be sent to your provider as well.   To learn more about what you can do with MyChart, go to [email or web address].    Your next appointment:   4 month(s)  Provider:    Dr.Schumann

## 2023-02-26 NOTE — Telephone Encounter (Signed)
Please review. Only 1 tab sent to pharmacy

## 2023-02-27 LAB — BASIC METABOLIC PANEL WITH GFR
BUN/Creatinine Ratio: 14 (ref 12–28)
BUN: 8 mg/dL (ref 8–27)
CO2: 24 mmol/L (ref 20–29)
Calcium: 9 mg/dL (ref 8.7–10.3)
Chloride: 106 mmol/L (ref 96–106)
Creatinine, Ser: 0.57 mg/dL (ref 0.57–1.00)
Glucose: 88 mg/dL (ref 70–99)
Potassium: 4.4 mmol/L (ref 3.5–5.2)
Sodium: 141 mmol/L (ref 134–144)
eGFR: 103 mL/min/1.73

## 2023-02-27 LAB — LIPID PANEL
Chol/HDL Ratio: 3.8 ratio (ref 0.0–4.4)
Cholesterol, Total: 193 mg/dL (ref 100–199)
HDL: 51 mg/dL (ref >39)
LDL Chol Calc (NIH): 119 mg/dL — ABNORMAL HIGH (ref 0–99)
Triglycerides: 132 mg/dL (ref 0–149)
VLDL Cholesterol Cal: 23 mg/dL (ref 5–40)

## 2023-03-03 ENCOUNTER — Other Ambulatory Visit: Payer: Self-pay

## 2023-03-03 MED ORDER — METOPROLOL TARTRATE 100 MG PO TABS: ORAL_TABLET | ORAL | Status: DC

## 2023-03-03 NOTE — Telephone Encounter (Signed)
Spoke to patient with help of a Arabic interpreter patient was wanting to know why she is suppose to take Metoprolol 100 mg twice a day.Advised she is only to take 100 mg 2 hours before coronary ct.

## 2023-03-07 ENCOUNTER — Other Ambulatory Visit: Payer: Self-pay | Admitting: Gastroenterology

## 2023-03-11 ENCOUNTER — Other Ambulatory Visit: Payer: Self-pay | Admitting: Gastroenterology

## 2023-03-11 ENCOUNTER — Encounter: Payer: Self-pay | Admitting: Emergency Medicine

## 2023-03-11 ENCOUNTER — Ambulatory Visit (INDEPENDENT_AMBULATORY_CARE_PROVIDER_SITE_OTHER): Payer: Medicaid Other

## 2023-03-11 ENCOUNTER — Ambulatory Visit (INDEPENDENT_AMBULATORY_CARE_PROVIDER_SITE_OTHER): Payer: Medicaid Other | Admitting: Emergency Medicine

## 2023-03-11 VITALS — BP 108/72 | HR 72 | Temp 98.7°F | Ht 66.0 in | Wt 177.0 lb

## 2023-03-11 DIAGNOSIS — G8929 Other chronic pain: Secondary | ICD-10-CM | POA: Diagnosis not present

## 2023-03-11 DIAGNOSIS — D259 Leiomyoma of uterus, unspecified: Secondary | ICD-10-CM

## 2023-03-11 DIAGNOSIS — H547 Unspecified visual loss: Secondary | ICD-10-CM

## 2023-03-11 DIAGNOSIS — Z7689 Persons encountering health services in other specified circumstances: Secondary | ICD-10-CM

## 2023-03-11 DIAGNOSIS — M25512 Pain in left shoulder: Secondary | ICD-10-CM

## 2023-03-11 DIAGNOSIS — K5000 Crohn's disease of small intestine without complications: Secondary | ICD-10-CM | POA: Diagnosis not present

## 2023-03-11 DIAGNOSIS — M810 Age-related osteoporosis without current pathological fracture: Secondary | ICD-10-CM

## 2023-03-11 DIAGNOSIS — E785 Hyperlipidemia, unspecified: Secondary | ICD-10-CM

## 2023-03-11 DIAGNOSIS — F3341 Major depressive disorder, recurrent, in partial remission: Secondary | ICD-10-CM

## 2023-03-11 DIAGNOSIS — I10 Essential (primary) hypertension: Secondary | ICD-10-CM | POA: Diagnosis not present

## 2023-03-11 LAB — URINALYSIS, ROUTINE W REFLEX MICROSCOPIC
Bilirubin Urine: NEGATIVE
Ketones, ur: NEGATIVE
Leukocytes,Ua: NEGATIVE
Nitrite: NEGATIVE
Specific Gravity, Urine: 1.025 (ref 1.000–1.030)
Total Protein, Urine: NEGATIVE
Urine Glucose: NEGATIVE
Urobilinogen, UA: 1 (ref 0.0–1.0)
pH: 6 (ref 5.0–8.0)

## 2023-03-11 LAB — COMPREHENSIVE METABOLIC PANEL
ALT: 13 U/L (ref 0–35)
AST: 16 U/L (ref 0–37)
Albumin: 4.2 g/dL (ref 3.5–5.2)
Alkaline Phosphatase: 96 U/L (ref 39–117)
BUN: 11 mg/dL (ref 6–23)
CO2: 28 meq/L (ref 19–32)
Calcium: 9.4 mg/dL (ref 8.4–10.5)
Chloride: 106 meq/L (ref 96–112)
Creatinine, Ser: 0.53 mg/dL (ref 0.40–1.20)
GFR: 99.63 mL/min (ref >60.00)
Glucose, Bld: 88 mg/dL (ref 70–99)
Potassium: 4.2 meq/L (ref 3.5–5.1)
Sodium: 140 meq/L (ref 135–145)
Total Bilirubin: 0.4 mg/dL (ref 0.2–1.2)
Total Protein: 6.9 g/dL (ref 6.0–8.3)

## 2023-03-11 LAB — HEMOGLOBIN A1C: Hgb A1c MFr Bld: 5.2 % (ref 4.6–6.5)

## 2023-03-11 LAB — CBC WITH DIFFERENTIAL/PLATELET
Basophils Absolute: 0 10*3/uL (ref 0.0–0.1)
Basophils Relative: 0.6 % (ref 0.0–3.0)
Eosinophils Absolute: 0.2 10*3/uL (ref 0.0–0.7)
Eosinophils Relative: 3.1 % (ref 0.0–5.0)
HCT: 40.5 % (ref 36.0–46.0)
Hemoglobin: 13.6 g/dL (ref 12.0–15.0)
Lymphocytes Relative: 16.2 % (ref 12.0–46.0)
Lymphs Abs: 0.8 10*3/uL (ref 0.7–4.0)
MCHC: 33.5 g/dL (ref 30.0–36.0)
MCV: 91.3 fL (ref 78.0–100.0)
Monocytes Absolute: 0.4 10*3/uL (ref 0.1–1.0)
Monocytes Relative: 8.3 % (ref 3.0–12.0)
Neutro Abs: 3.4 10*3/uL (ref 1.4–7.7)
Neutrophils Relative %: 71.8 % (ref 43.0–77.0)
Platelets: 192 10*3/uL (ref 150.0–400.0)
RBC: 4.43 Mil/uL (ref 3.87–5.11)
RDW: 14.8 % (ref 11.5–15.5)
WBC: 4.8 10*3/uL (ref 4.0–10.5)

## 2023-03-11 LAB — TSH: TSH: 3.55 u[IU]/mL (ref 0.35–5.50)

## 2023-03-11 LAB — LIPID PANEL
Cholesterol: 185 mg/dL (ref 0–200)
HDL: 50.1 mg/dL (ref >39.00)
LDL Cholesterol: 109 mg/dL — ABNORMAL HIGH (ref 0–99)
NonHDL: 135.09
Total CHOL/HDL Ratio: 4
Triglycerides: 131 mg/dL (ref 0.0–149.0)
VLDL: 26.2 mg/dL (ref 0.0–40.0)

## 2023-03-11 NOTE — Assessment & Plan Note (Signed)
Stable.  Continues Boniva 150 mg monthly

## 2023-03-11 NOTE — Patient Instructions (Signed)

## 2023-03-11 NOTE — Assessment & Plan Note (Signed)
BP Readings from Last 3 Encounters:  03/11/23 108/72  02/26/23 122/80  01/31/23 109/75  Well-controlled hypertension Continue candesartan 8 mg daily Cardiovascular risks associated with hypertension discussed Diet and nutrition discussed

## 2023-03-11 NOTE — Progress Notes (Signed)
Lindsey Massey 62 y.o.   Chief Complaint  Patient presents with   Establish Care    Patient wants referrals for an obgyn, eye dr. Ardith Dark. Pt also c/o of  having "hard" breathing. Patient states having bone pain, neck, shoulder, back.      HISTORY OF PRESENT ILLNESS: This is a 62 y.o. female first visit to this office here to establish care Accompanied by interpreter Has the following medical history: 1.  Hypertension 2.  Osteoporosis 3.  Terminal ileitis 4.  Dyslipidemia Complaining of chronic pain to left shoulder Requesting referrals to gynecologist and ophthalmologist 5.  History of depression  HPI   Prior to Admission medications   Medication Sig Start Date End Date Taking? Authorizing Provider  ALPRAZolam Prudy Feeler) 0.5 MG tablet Take 0.5 mg by mouth at bedtime as needed for anxiety. Patient not taking: Reported on 02/26/2023    [provider]  amoxicillin-clavulanate (AUGMENTIN) 875-125 MG tablet Take 1 tablet by mouth every 12 (twelve) hours. 01/02/23   Vanetta Mulders, MD  aspirin EC 81 MG tablet Take 1 tablet (81 mg total) by mouth daily. Swallow whole. 01/24/23   Mansouraty, Netty Starring., MD  azaTHIOprine (IMURAN) 50 MG tablet TAKE 1 TABLET BY MOUTH EVERY DAY 02/06/23   Mansouraty, Netty Starring., MD  budesonide (ENTOCORT EC) 3 MG 24 hr capsule Take 3 capsules (9 mg total) by mouth daily. 01/08/23   Mansouraty, Netty Starring., MD  calcium carbonate (CALCIUM ANTACID) 500 MG chewable tablet Chew 1 tablet (200 mg of elemental calcium total) by mouth daily. Patient not taking: Reported on 02/26/2023 01/24/23   Mansouraty, Netty Starring., MD  candesartan (ATACAND) 8 MG tablet TAKE 1 TABLET BY MOUTH EVERY DAY 02/06/23   Mansouraty, Netty Starring., MD  Cholecalciferol (VITAMIN D3) 125 MCG (5000 UT) CAPS Take 1 capsule (5,000 Units total) by mouth daily. 01/24/23   Mansouraty, Netty Starring., MD  diltiazem (CARDIZEM) 90 MG tablet Take 1 tablet (90 mg total) by mouth 2 (two) times daily.  01/08/23   Mansouraty, Netty Starring., MD  doxazosin (CARDURA XL) 4 MG 24 hr tablet Take 1 tablet (4 mg total) by mouth daily with breakfast. 01/08/23   Mansouraty, Netty Starring., MD  esomeprazole (NEXIUM) 20 MG capsule Take 2 capsules (40 mg total) by mouth daily before breakfast. 01/08/23   Mansouraty, Netty Starring., MD  ibandronate (BONIVA) 150 MG tablet Take 1 tablet (150 mg total) by mouth every 30 (thirty) days. Take in the morning with a full glass of water, on an empty stomach, and do not take anything else by mouth or lie down for the next 30 min. 01/24/23   Mansouraty, Netty Starring., MD  metoprolol tartrate (LOPRESSOR) 100 MG tablet Take 100 mg 2 hours before coronary ct 03/03/23   Little Ishikawa, MD  mirtazapine (REMERON) 15 MG tablet Take 1 tablet (15 mg total) by mouth at bedtime. 01/24/23   Mansouraty, Netty Starring., MD  nitrofurantoin, macrocrystal-monohydrate, (MACROBID) 100 MG capsule Take 1 capsule (100 mg total) by mouth 2 (two) times daily. 01/31/23   Pricilla Loveless, MD  sertraline (ZOLOFT) 50 MG tablet Take 1 tablet (50 mg total) by mouth daily. 01/24/23   Mansouraty, Netty Starring., MD  simvastatin (ZOCOR) 20 MG tablet Take 0.5 tablets (10 mg total) by mouth daily. 02/14/23   Mansouraty, Netty Starring., MD  traMADol (ULTRAM) 50 MG tablet Take 1 tablet (50 mg total) by mouth every 8 (eight) hours as needed. 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) 01/08/2023   Immunosuppression due to drug therapy (HCC) 01/08/2023   HLD (hyperlipidemia) 09/16/2011   Osteoporosis 09/16/2011   GERD without esophagitis 09/16/2011   Uterine fibroid 06/26/2011   Depression 06/26/2011   Hypercholesterolemia 06/26/2011    Past Medical History:  Diagnosis Date   Crohn disease (HCC)    Depressed    GERD  (gastroesophageal reflux disease)    High cholesterol    Hypercholesteremia    Hypertension    Osteoporosis    Shortness of breath    sometimes before sleep   Uterine fibroid     Past Surgical History:  Procedure Laterality Date   TONSILLECTOMY     age 61   TONSILLECTOMY AND ADENOIDECTOMY      Social History   Socioeconomic History   Marital status: Divorced    Spouse name: Not on file   Number of children: Not on file   Years of education: Not on file   Highest education level: 12th grade  Occupational History   Not on file  Tobacco Use   Smoking status: Every Day    Current packs/day: 5.00    Average packs/day: 5.0 packs/day for 10.9 years (54.6 ttl pk-yrs)    Types: Cigarettes    Start date: 2014   Smokeless tobacco: Not on file  Vaping Use   Vaping status: Never Used  Substance and Sexual Activity   Alcohol use: No   Drug use: No   Sexual activity: Not Currently  Other Topics Concern   Not on file  Social History Narrative   Lives with sister and nephews (18,16) in [place]. Moved from [place] in April 2012. Separated from husband. No children. Does not work.      Balanced diet. Little exercise.      Christian faith.   Social Determinants of Health   Financial Resource Strain: High Risk (03/10/2023)   Overall Financial Resource Strain (CARDIA)    Difficulty of Paying Living Expenses: Very hard  Food Insecurity: Food Insecurity Present (03/10/2023)   Hunger Vital Sign    Worried About Running Out of Food in the Last Year: Often true    Ran Out of Food in the Last Year: Sometimes true  Transportation Needs: Unmet Transportation Needs (03/10/2023)   PRAPARE - Administrator, Civil Service (Medical): Yes    Lack of Transportation (Non-Medical): Yes  Physical Activity: Sufficiently Active (03/10/2023)   Exercise Vital Sign    Days of Exercise per Week: 3 days    Minutes of Exercise per Session: 60 min  Stress: Stress Concern Present  (03/10/2023)   [employer] of Occupational Health - Occupational Stress Questionnaire    Feeling of Stress : Very much  Social Connections: Moderately Isolated (03/10/2023)   Social Connection and Isolation Panel [NHANES]    Frequency of Communication with Friends and Family: More than three times a week    Frequency of Social Gatherings with Friends and Family: More than three times a week    Attends Religious Services: More than 4 times per year    Active Member of [employer] or Organizations: No    Attends Engineer, structural: Not on file    Marital Status: Divorced  Intimate Partner Violence: Not on file    Family History  Problem Relation Age of Onset  Arthritis Mother    Hypertension Mother    Heart disease Mother    Cancer Father        liver   Hypertension Father    Arthritis Sister    Hypertension Brother    Multiple sclerosis Brother    Heart disease Paternal Uncle    Anesthesia problems Neg Hx    Colon cancer Neg Hx    Esophageal cancer Neg Hx    Inflammatory bowel disease Neg Hx    Liver disease Neg Hx    Pancreatic cancer Neg Hx    Rectal cancer Neg Hx    Stomach cancer Neg Hx      Review of Systems  Constitutional: Negative.  Negative for chills and fever.  HENT: Negative.  Negative for congestion and sore throat.   Respiratory: Negative.  Negative for cough and shortness of breath.   Cardiovascular: Negative.  Negative for chest pain and palpitations.  Gastrointestinal:  Negative for abdominal pain, diarrhea, nausea and vomiting.  Genitourinary: Negative.  Negative for dysuria and hematuria.  Musculoskeletal:  Positive for back pain and joint pain.  Skin: Negative.  Negative for rash.  Neurological: Negative.  Negative for dizziness and headaches.  All other systems reviewed and are negative.   Today's Vitals   03/11/23 1313  BP: 108/72  Pulse: 72  Temp: 98.7 F (37.1 C)  TempSrc: Oral  SpO2: 95%  Weight: 177 lb (80.3 kg)  Height: 5'  6" (1.676 m)   Body mass index is 28.57 kg/m.   Physical Exam Vitals reviewed.  Constitutional:      Appearance: Normal appearance.  HENT:     Head: Normocephalic.     Mouth/Throat:     Mouth: Mucous membranes are moist.     Pharynx: Oropharynx is clear.  Eyes:     Extraocular Movements: Extraocular movements intact.     Conjunctiva/sclera: Conjunctivae normal.     Pupils: Pupils are equal, round, and reactive to light.  Cardiovascular:     Rate and Rhythm: Normal rate and regular rhythm.     Pulses: Normal pulses.     Heart sounds: Normal heart sounds.  Pulmonary:     Effort: Pulmonary effort is normal.     Breath sounds: Normal breath sounds.  Musculoskeletal:     Cervical back: No tenderness.  Lymphadenopathy:     Cervical: No cervical adenopathy.  Skin:    General: Skin is warm and dry.  Neurological:     Mental Status: She is alert and oriented to person, place, and time.  Psychiatric:        Mood and Affect: Mood normal.        Behavior: Behavior normal.      ASSESSMENT & PLAN: A total of 48 minutes was spent with the patient and counseling/coordination of care regarding preparing for this visit, review of available medical records, establishing care with me, review of multiple chronic medical conditions and their management, review of all medications, comprehensive history and physical examination, review of most recent bloodwork results, review of health maintenance items, education on nutrition, prognosis, documentation, and need for follow up. .  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  Sees GI doctor on a regular basis Currently on Imuran 50 mg daily      Relevant Orders   CBC with Differential/Platelet     Musculoskeletal and Integument   Osteoporosis    Stable.  Continues Boniva 150 mg monthly        Genitourinary   Uterine fibroid    And chronic pelvic pain.  Needs gynecology evaluation.  Referral placed today.        Other   Depression    Active.  Requesting referral to behavioral health Presently on Zoloft 50 mg daily.      Relevant Orders   Ambulatory referral to Psychology   Dyslipidemia    Stable chronic condition Diet and nutrition discussed Continue simvastatin 10 mg daily      Relevant Orders   Comprehensive metabolic panel   Hemoglobin A1c   Lipid panel   Other Visit Diagnoses     Encounter to establish care       Relevant Orders   Ambulatory referral to Gynecology   Chronic left shoulder pain       Relevant Orders   Ambulatory referral to Sports Medicine   DG Shoulder Left   Vision problems       Relevant Orders   Ambulatory referral to Ophthalmology      Patient Instructions  Health Maintenance, Female Adopting a healthy lifestyle and getting preventive care are important in promoting health and wellness. Ask your health care provider about: The right schedule for you to have regular tests and exams. Things you can do on your own to prevent diseases and keep yourself healthy. What should I know about diet, weight, and exercise? Eat a healthy diet  Eat a diet that includes plenty of vegetables, fruits, low-fat dairy products, and lean protein. Do not eat a lot of foods that are high in solid fats, added sugars, or sodium. Maintain a healthy weight Body mass index (BMI) is used to identify weight problems. It  estimates body fat based on height and weight. Your health care provider can help determine your BMI and help you achieve or maintain a healthy weight. Get regular exercise Get regular exercise. This is one of the most important things you can do for your health. Most adults should: Exercise for at least 150 minutes each week. The exercise should increase your heart rate and make you sweat (moderate-intensity exercise). Do strengthening exercises at least twice a week. This is in addition to the moderate-intensity exercise. Spend less time sitting. Even light physical activity can be beneficial. Watch cholesterol and blood lipids Have your blood tested for lipids and cholesterol at 61 years of age, then have this test every 5 years. Have your cholesterol levels checked more often if: Your lipid or cholesterol levels are high. You are older than 61 years of age. You are at high risk for heart disease. What should I know about cancer screening? Depending on your health history and family history, you may need to have cancer screening at various ages. This may include screening for: Breast cancer. Cervical cancer. Colorectal cancer. Skin cancer. Lung cancer. What should I know about heart disease, diabetes, and high blood pressure? Blood pressure and  heart disease High blood pressure causes heart disease and increases the risk of stroke. This is more likely to develop in people who have high blood pressure readings or are overweight. Have your blood pressure checked: Every 3-5 years if you are 34-51 years of age. Every year if you are 28 years old or older. Diabetes Have regular diabetes screenings. This checks your fasting blood sugar level. Have the screening done: Once every three years after age 34 if you are at a normal weight and have a low risk for diabetes. More often and at a younger age if you are overweight or have a high risk for diabetes. What should I know about preventing  infection? Hepatitis B If you have a higher risk for hepatitis B, you should be screened for this virus. Talk with your health care provider to find out if you are at risk for hepatitis B infection. Hepatitis C Testing is recommended for: Everyone born from [birth year] through 1964. Anyone with known risk factors for hepatitis C. Sexually transmitted infections (STIs) Get screened for STIs, including gonorrhea and chlamydia, if: You are sexually active and are younger than 61 years of age. You are older than 61 years of age and your health care provider tells you that you are at risk for this type of infection. Your sexual activity has changed since you were last screened, and you are at increased risk for chlamydia or gonorrhea. Ask your health care provider if you are at risk. Ask your health care provider about whether you are at high risk for HIV. Your health care provider may recommend a prescription medicine to help prevent HIV infection. If you choose to take medicine to prevent HIV, you should first get tested for HIV. You should then be tested every 3 months for as long as you are taking the medicine. Pregnancy If you are about to stop having your period (premenopausal) and you may become pregnant, seek counseling before you get pregnant. Take 400 to 800 micrograms (mcg) of folic acid every day if you become pregnant. Ask for birth control (contraception) if you want to prevent pregnancy. Osteoporosis and menopause Osteoporosis is a disease in which the bones lose minerals and strength with aging. This can result in bone fractures. If you are 64 years old or older, or if you are at risk for osteoporosis and fractures, ask your health care provider if you should: Be screened for bone loss. Take a calcium or vitamin D supplement to lower your risk of fractures. Be given hormone replacement therapy (HRT) to treat symptoms of menopause. Follow these instructions at home: Alcohol use Do not  drink alcohol if: Your health care provider tells you not to drink. You are pregnant, may be pregnant, or are planning to become pregnant. If you drink alcohol: Limit how much you have to: 0-1 drink a day. Know how much alcohol is in your drink. In the [place] one drink equals one 12 oz bottle of beer (355 mL), one 5 oz glass of wine (148 mL), or one 1 oz glass of hard liquor (44 mL). Lifestyle Do not use any products that contain nicotine or tobacco. These products include cigarettes, chewing tobacco, and vaping devices, such as e-cigarettes. If you need help quitting, ask your health care provider. Do not use street drugs. Do not share needles. Ask your health care provider for help if you need support or information about quitting drugs. General instructions Schedule regular health, dental, and eye exams. Stay current with  your vaccines. Tell your health care provider if: You often feel depressed. You have ever been abused or do not feel safe at home. Summary Adopting a healthy lifestyle and getting preventive care are important in promoting health and wellness. Follow your health care provider's instructions about healthy diet, exercising, and getting tested or screened for diseases. Follow your health care provider's instructions on monitoring your cholesterol and blood pressure. This information is not intended to replace advice given to you by your health care provider. Make sure you discuss any questions you have with your health care provider. Document Revised: 08/14/2020 Document Reviewed: 08/14/2020 Elsevier Patient Education  2024 Elsevier Inc.     Edwina Barth, MD [facility]

## 2023-03-11 NOTE — Assessment & Plan Note (Signed)
Active.  Requesting referral to behavioral health Presently on Zoloft 50 mg daily.

## 2023-03-11 NOTE — Assessment & Plan Note (Signed)
And chronic pelvic pain.  Needs gynecology evaluation.  Referral placed today.

## 2023-03-11 NOTE — Assessment & Plan Note (Signed)
Stable chronic condition Diet and nutrition discussed Continue simvastatin 10 mg daily

## 2023-03-11 NOTE — Assessment & Plan Note (Signed)
Stable and well-controlled.  Sees GI doctor on a regular basis Currently on Imuran 50 mg daily

## 2023-03-11 NOTE — Progress Notes (Deleted)
Lindsey Massey 62 y.o.   No chief complaint on file.   HISTORY OF PRESENT ILLNESS: This is a 62 y.o. female complaining of ***. ***.  HPI   Prior to Admission medications   Medication Sig Start Date End Date Taking? Authorizing Provider  ALPRAZolam Prudy Feeler) 0.5 MG tablet Take 0.5 mg by mouth at bedtime as needed for anxiety. Patient not taking: Reported on 02/26/2023    [provider]  amoxicillin-clavulanate (AUGMENTIN) 875-125 MG tablet Take 1 tablet by mouth every 12 (twelve) hours. 01/02/23   Vanetta Mulders, MD  aspirin EC 81 MG tablet Take 1 tablet (81 mg total) by mouth daily. Swallow whole. 01/24/23   Mansouraty, Netty Starring., MD  azaTHIOprine (IMURAN) 50 MG tablet TAKE 1 TABLET BY MOUTH EVERY DAY 02/06/23   Mansouraty, Netty Starring., MD  budesonide (ENTOCORT EC) 3 MG 24 hr capsule Take 3 capsules (9 mg total) by mouth daily. 01/08/23   Mansouraty, Netty Starring., MD  calcium carbonate (CALCIUM ANTACID) 500 MG chewable tablet Chew 1 tablet (200 mg of elemental calcium total) by mouth daily. Patient not taking: Reported on 02/26/2023 01/24/23   Mansouraty, Netty Starring., MD  candesartan (ATACAND) 8 MG tablet TAKE 1 TABLET BY MOUTH EVERY DAY 02/06/23   Mansouraty, Netty Starring., MD  Cholecalciferol (VITAMIN D3) 125 MCG (5000 UT) CAPS Take 1 capsule (5,000 Units total) by mouth daily. 01/24/23   Mansouraty, Netty Starring., MD  diltiazem (CARDIZEM) 90 MG tablet Take 1 tablet (90 mg total) by mouth 2 (two) times daily. 01/08/23   Mansouraty, Netty Starring., MD  doxazosin (CARDURA XL) 4 MG 24 hr tablet Take 1 tablet (4 mg total) by mouth daily with breakfast. 01/08/23   Mansouraty, Netty Starring., MD  esomeprazole (NEXIUM) 20 MG capsule Take 2 capsules (40 mg total) by mouth daily before breakfast. 01/08/23   Mansouraty, Netty Starring., MD  ibandronate (BONIVA) 150 MG tablet Take 1 tablet (150 mg total) by mouth every 30 (thirty) days. Take in the morning with a full glass of water, on an empty stomach,  and do not take anything else by mouth or lie down for the next 30 min. 01/24/23   Mansouraty, Netty Starring., MD  metoprolol tartrate (LOPRESSOR) 100 MG tablet Take 100 mg 2 hours before coronary ct 03/03/23   Little Ishikawa, MD  mirtazapine (REMERON) 15 MG tablet Take 1 tablet (15 mg total) by mouth at bedtime. 01/24/23   Mansouraty, Netty Starring., MD  nitrofurantoin, macrocrystal-monohydrate, (MACROBID) 100 MG capsule Take 1 capsule (100 mg total) by mouth 2 (two) times daily. 01/31/23   Pricilla Loveless, MD  sertraline (ZOLOFT) 50 MG tablet Take 1 tablet (50 mg total) by mouth daily. 01/24/23   Mansouraty, Netty Starring., MD  simvastatin (ZOCOR) 20 MG tablet Take 0.5 tablets (10 mg total) by mouth daily. 02/14/23   Mansouraty, Netty Starring., MD  traMADol (ULTRAM) 50 MG tablet Take 1 tablet (50 mg total) by mouth every 8 (eight) hours as needed. 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) 01/08/2023   Immunosuppression due to drug therapy (HCC) 01/08/2023   HLD (hyperlipidemia) 09/16/2011   Osteoporosis 09/16/2011   GERD without esophagitis 09/16/2011   Uterine fibroid 06/26/2011   Depression 06/26/2011   Hypercholesterolemia 06/26/2011    Past Medical History:  Diagnosis Date  Crohn disease (HCC)    Depressed    GERD (gastroesophageal reflux disease)    High cholesterol    Hypercholesteremia    Hypertension    Osteoporosis    Shortness of breath    sometimes before sleep   Uterine fibroid     Past Surgical History:  Procedure Laterality Date   TONSILLECTOMY     age 2   TONSILLECTOMY AND ADENOIDECTOMY      Social History   Socioeconomic History   Marital status: Single    Spouse name: Not on file   Number of children: Not on file   Years of education: Not on file   Highest  education level: 12th grade  Occupational History   Not on file  Tobacco Use   Smoking status: Every Day    Current packs/day: 0.30    Types: Cigarettes   Smokeless tobacco: Not on file  Vaping Use   Vaping status: Never Used  Substance and Sexual Activity   Alcohol use: No   Drug use: No   Sexual activity: Never  Other Topics Concern   Not on file  Social History Narrative   Lives with sister and nephews (18,16) in [place]. Moved from [place] in April 2012. Separated from husband. No children. Does not work.      Balanced diet. Little exercise.      Christian faith.   Social Determinants of Health   Financial Resource Strain: High Risk (03/10/2023)   Overall Financial Resource Strain (CARDIA)    Difficulty of Paying Living Expenses: Very hard  Food Insecurity: Food Insecurity Present (03/10/2023)   Hunger Vital Sign    Worried About Running Out of Food in the Last Year: Often true    Ran Out of Food in the Last Year: Sometimes true  Transportation Needs: Unmet Transportation Needs (03/10/2023)   PRAPARE - Administrator, Civil Service (Medical): Yes    Lack of Transportation (Non-Medical): Yes  Physical Activity: Sufficiently Active (03/10/2023)   Exercise Vital Sign    Days of Exercise per Week: 3 days    Minutes of Exercise per Session: 60 min  Stress: Stress Concern Present (03/10/2023)   [employer] of Occupational Health - Occupational Stress Questionnaire    Feeling of Stress : Very much  Social Connections: Moderately Isolated (03/10/2023)   Social Connection and Isolation Panel [NHANES]    Frequency of Communication with Friends and Family: More than three times a week    Frequency of Social Gatherings with Friends and Family: More than three times a week    Attends Religious Services: More than 4 times per year    Active Member of [employer] or Organizations: No    Attends Engineer, structural: Not on file    Marital Status: Divorced   Catering manager Violence: Not on file    Family History  Problem Relation Age of Onset   Hypertension Mother    Heart disease Mother    Cancer Father        liver   Hypertension Father    Arthritis Sister    Hypertension Brother    Heart disease Paternal Uncle    Anesthesia problems Neg Hx    Colon cancer Neg Hx    Esophageal cancer Neg Hx    Inflammatory bowel disease Neg Hx    Liver disease Neg Hx    Pancreatic cancer Neg Hx    Rectal cancer Neg Hx    Stomach cancer Neg Hx  ROS  There were no vitals filed for this visit.  Physical Exam   ASSESSMENT & PLAN: Problem List Items Addressed This Visit   None    Edwina Barth, MD [facility]

## 2023-03-12 ENCOUNTER — Other Ambulatory Visit: Payer: Self-pay | Admitting: Gastroenterology

## 2023-03-14 NOTE — Progress Notes (Unsigned)
    Aleen Sells [facility] [address] [place] [address] Phone: [phone]   Assessment and Plan:     There are no diagnoses linked to this encounter.  ***   Pertinent previous records reviewed include ***    Follow Up: ***     Subjective:   I, Marylynn Rigdon, am serving as a Neurosurgeon for Doctor Richardean Sale  Chief Complaint: left shoulder pain   HPI:   03/17/2023 Patient is a 62 year old female with left shoulder pain. Patient states  Relevant Historical Information: ***  Additional pertinent review of systems negative.   Current Outpatient Medications:    aspirin EC 81 MG tablet, Take 1 tablet (81 mg total) by mouth daily. Swallow whole., Disp: 30 tablet, Rfl: 0   azaTHIOprine (IMURAN) 50 MG tablet, TAKE 1 TABLET BY MOUTH EVERY DAY, Disp: 30 tablet, Rfl: 0   calcium carbonate (CALCIUM ANTACID) 500 MG chewable tablet, Chew 1 tablet (200 mg of elemental calcium total) by mouth daily., Disp: 30 tablet, Rfl: 0   candesartan (ATACAND) 8 MG tablet, TAKE 1 TABLET BY MOUTH EVERY DAY, Disp: 30 tablet, Rfl: 0   Cholecalciferol (VITAMIN D3) 125 MCG (5000 UT) CAPS, Take 1 capsule (5,000 Units total) by mouth daily., Disp: 30 capsule, Rfl: 0   ibandronate (BONIVA) 150 MG tablet, Take 1 tablet (150 mg total) by mouth every 30 (thirty) days. Take in the morning with a full glass of water, on an empty stomach, and do not take anything else by mouth or lie down for the next 30 min., Disp: 1 tablet, Rfl: 0   mirtazapine (REMERON) 15 MG tablet, Take 1 tablet (15 mg total) by mouth at bedtime., Disp: 30 tablet, Rfl: 0   sertraline (ZOLOFT) 50 MG tablet, Take 1 tablet (50 mg total) by mouth daily., Disp: 30 tablet, Rfl: 0   simvastatin (ZOCOR) 20 MG tablet, Take 0.5 tablets (10 mg total) by mouth daily., Disp: 30 tablet, Rfl: 0   Objective:     There were no vitals filed for this visit.    There is no height or weight on file to calculate  BMI.    Physical Exam:    ***   Electronically signed by:  Aleen Sells [facility] 7:32 AM 03/14/23

## 2023-03-17 ENCOUNTER — Ambulatory Visit: Payer: Medicaid Other | Admitting: Sports Medicine

## 2023-03-19 ENCOUNTER — Encounter: Payer: Self-pay | Admitting: Emergency Medicine

## 2023-03-19 ENCOUNTER — Telehealth: Payer: Medicaid Other | Admitting: Emergency Medicine

## 2023-03-20 ENCOUNTER — Telehealth: Payer: Medicaid Other | Admitting: Emergency Medicine

## 2023-03-24 ENCOUNTER — Encounter (HOSPITAL_COMMUNITY): Payer: Self-pay

## 2023-03-25 ENCOUNTER — Other Ambulatory Visit: Payer: Self-pay | Admitting: Gastroenterology

## 2023-03-25 ENCOUNTER — Ambulatory Visit (HOSPITAL_COMMUNITY)
Admission: RE | Admit: 2023-03-25 | Discharge: 2023-03-25 | Disposition: A | Discharge status: Home / Self Care | Payer: Medicaid Other | Source: Ambulatory Visit | Attending: Cardiology | Admitting: Cardiology

## 2023-03-25 DIAGNOSIS — R079 Chest pain, unspecified: Secondary | ICD-10-CM | POA: Diagnosis present

## 2023-03-25 DIAGNOSIS — R072 Precordial pain: Secondary | ICD-10-CM

## 2023-03-25 MED ORDER — SODIUM CHLORIDE 0.9 % IV BOLUS
250.0000 mL | Freq: Once | INTRAVENOUS | Status: AC
  Administered 2023-03-25: 250 mL via INTRAVENOUS

## 2023-03-25 MED ORDER — NITROGLYCERIN 0.4 MG SL SUBL
SUBLINGUAL_TABLET | SUBLINGUAL | Status: AC
Start: 2023-03-25 — End: ?
  Filled 2023-03-25: qty 1

## 2023-03-25 MED ORDER — IOHEXOL 350 MG/ML SOLN
190.0000 mL | Freq: Once | INTRAVENOUS | Status: AC | PRN
  Administered 2023-03-25: 190 mL via INTRAVENOUS

## 2023-03-25 MED ORDER — SODIUM CHLORIDE 0.9 % IV BOLUS
500.0000 mL | Freq: Once | INTRAVENOUS | Status: AC
  Administered 2023-03-25: 500 mL via INTRAVENOUS

## 2023-03-25 MED ORDER — NITROGLYCERIN 0.4 MG SL SUBL
0.4000 mg | SUBLINGUAL_TABLET | Freq: Once | SUBLINGUAL | Status: AC
  Administered 2023-03-25: 0.4 mg via SUBLINGUAL

## 2023-03-25 NOTE — Progress Notes (Signed)
Upon patient's arrival HR 47,BP 95/62. C/o feeling lightheaded. Crenshaw, MD notified, orders for 500 ml NS bolus. 1240: Patient now c/o 5/10 chest pain. STAT EKG ordered. MD Crenshaw notified.  12:45 0/10 check pain, patient still c/o lightheadedness  Tenny Craw, MD at bedside. Additional 250 ml NS bolus ordered and given.  Recheck BP 106/69. No c/o of CP or lightheadedness.  MD Tenny Craw updated. CT canceled.

## 2023-03-26 ENCOUNTER — Other Ambulatory Visit: Payer: Self-pay | Admitting: Emergency Medicine

## 2023-03-26 MED ORDER — CANDESARTAN CILEXETIL 8 MG PO TABS: 8.0000 mg | ORAL_TABLET | Freq: Every day | ORAL | 3 refills | Status: DC

## 2023-03-26 NOTE — Telephone Encounter (Signed)
Do you want to refill this or defer to PCP?

## 2023-03-26 NOTE — Telephone Encounter (Signed)
[name], This medication now needs to be deferred to her PCP since she is established with 1. Thanks. GM

## 2023-03-27 ENCOUNTER — Ambulatory Visit (AMBULATORY_SURGERY_CENTER): Payer: Medicaid Other | Admitting: *Deleted

## 2023-03-27 VITALS — Ht 66.0 in | Wt 178.0 lb

## 2023-03-27 DIAGNOSIS — K50919 Crohn's disease, unspecified, with unspecified complications: Secondary | ICD-10-CM

## 2023-03-27 DIAGNOSIS — Z1211 Encounter for screening for malignant neoplasm of colon: Secondary | ICD-10-CM

## 2023-03-27 DIAGNOSIS — K2100 Gastro-esophageal reflux disease with esophagitis, without bleeding: Secondary | ICD-10-CM

## 2023-03-27 MED ORDER — NA SULFATE-K SULFATE-MG SULF 17.5-3.13-1.6 GM/177ML PO SOLN: 1.0000 | Freq: Once | ORAL | 0 refills | Status: AC

## 2023-03-27 NOTE — Progress Notes (Signed)
Pt's name and DOB verified at the beginning of the pre-visit wit 2 identifiers  Pt denies any difficulty with ambulating,sitting, laying down or rolling side to side  Pt has no issues with ambulation   Pt has no issues moving head neck or swallowing  No egg or soy allergy known to patient   No issues known to pt with past sedation with any surgeries or procedures  Pt denies having issues being intubated  Patient denies ever being intubated  No FH of Malignant Hyperthermia  Pt is not on diet pills or shots  Pt is not on home 02   Pt is not on blood thinners   Pt has frequent issues with constipation RN instructed pt to use Miralax per bottles instructions a week before prep days. Pt states they will  Pt is not on dialysis  Pt denise any abnormal heart rhythms   Pt denies any upcoming cardiac testing  Pt encouraged to use to use Singlecare or Goodrx to reduce cost   Patient's chart reviewed by Cathlyn Parsons CNRA prior to pre-visit and patient appropriate for the [facility].  Pre-visit completed and red dot placed by patient's name on their procedure day (on provider's schedule).  .   Visit in person Interpretor for [facility] was pt's cousin.   Pt scale eight is 178 lb  Instructed pt why it is important to and  to call if they have any changes in health or new medications. Directed them to the # given and on instructions.     Instructions reviewed. Pt given both [facility] main # and MD on call # prior to instructions.  Pt states understanding. Instructed to review again prior to procedure. Pt states they will.   Instructions  to My Chart  Instructions and coupon given to pt

## 2023-04-04 ENCOUNTER — Other Ambulatory Visit: Payer: Self-pay | Admitting: Gastroenterology

## 2023-04-07 ENCOUNTER — Other Ambulatory Visit: Payer: Self-pay | Admitting: Gastroenterology

## 2023-04-09 HISTORY — PX: COLONOSCOPY: SHX174

## 2023-04-16 ENCOUNTER — Other Ambulatory Visit: Payer: Self-pay | Admitting: Gastroenterology

## 2023-04-16 ENCOUNTER — Ambulatory Visit (HOSPITAL_COMMUNITY): Payer: Medicaid Other | Attending: Cardiology

## 2023-04-16 DIAGNOSIS — R079 Chest pain, unspecified: Secondary | ICD-10-CM | POA: Diagnosis present

## 2023-04-16 LAB — ECHOCARDIOGRAM COMPLETE
Area-P 1/2: 3.75 cm2
S' Lateral: 2.7 cm

## 2023-04-17 MED ORDER — AZATHIOPRINE 50 MG PO TABS: 50.0000 mg | ORAL_TABLET | Freq: Every day | ORAL | 3 refills | Status: DC

## 2023-04-18 ENCOUNTER — Ambulatory Visit (AMBULATORY_SURGERY_CENTER): Payer: Medicaid Other | Admitting: Gastroenterology

## 2023-04-18 ENCOUNTER — Encounter: Payer: Self-pay | Admitting: Gastroenterology

## 2023-04-18 VITALS — BP 119/76 | HR 67 | Temp 97.8°F | Resp 9 | Ht 66.0 in | Wt 178.0 lb

## 2023-04-18 DIAGNOSIS — K644 Residual hemorrhoidal skin tags: Secondary | ICD-10-CM

## 2023-04-18 DIAGNOSIS — K2100 Gastro-esophageal reflux disease with esophagitis, without bleeding: Secondary | ICD-10-CM

## 2023-04-18 DIAGNOSIS — K5000 Crohn's disease of small intestine without complications: Secondary | ICD-10-CM | POA: Diagnosis not present

## 2023-04-18 DIAGNOSIS — K449 Diaphragmatic hernia without obstruction or gangrene: Secondary | ICD-10-CM | POA: Diagnosis not present

## 2023-04-18 DIAGNOSIS — K5730 Diverticulosis of large intestine without perforation or abscess without bleeding: Secondary | ICD-10-CM

## 2023-04-18 DIAGNOSIS — Z1211 Encounter for screening for malignant neoplasm of colon: Secondary | ICD-10-CM | POA: Diagnosis not present

## 2023-04-18 DIAGNOSIS — K2950 Unspecified chronic gastritis without bleeding: Secondary | ICD-10-CM

## 2023-04-18 DIAGNOSIS — K635 Polyp of colon: Secondary | ICD-10-CM | POA: Diagnosis not present

## 2023-04-18 DIAGNOSIS — K648 Other hemorrhoids: Secondary | ICD-10-CM | POA: Diagnosis not present

## 2023-04-18 DIAGNOSIS — K2289 Other specified disease of esophagus: Secondary | ICD-10-CM

## 2023-04-18 DIAGNOSIS — K2970 Gastritis, unspecified, without bleeding: Secondary | ICD-10-CM

## 2023-04-18 DIAGNOSIS — K219 Gastro-esophageal reflux disease without esophagitis: Secondary | ICD-10-CM

## 2023-04-18 DIAGNOSIS — K50919 Crohn's disease, unspecified, with unspecified complications: Secondary | ICD-10-CM

## 2023-04-18 DIAGNOSIS — D127 Benign neoplasm of rectosigmoid junction: Secondary | ICD-10-CM

## 2023-04-18 DIAGNOSIS — D128 Benign neoplasm of rectum: Secondary | ICD-10-CM

## 2023-04-18 DIAGNOSIS — D125 Benign neoplasm of sigmoid colon: Secondary | ICD-10-CM

## 2023-04-18 MED ORDER — SUCRALFATE 1 G PO TABS: 1.0000 g | ORAL_TABLET | Freq: Two times a day (BID) | ORAL | 1 refills | Status: DC

## 2023-04-18 MED ORDER — ESOMEPRAZOLE MAGNESIUM 40 MG PO CPDR: 40.0000 mg | DELAYED_RELEASE_CAPSULE | Freq: Every day | ORAL | 6 refills | Status: DC

## 2023-04-18 MED ORDER — SODIUM CHLORIDE 0.9 % IV SOLN: 500.0000 mL | Freq: Once | INTRAVENOUS | Status: DC

## 2023-04-18 NOTE — Op Note (Signed)
 [facility] Patient Name: Lindsey Massey Procedure Date: 04/18/2023 1:07 PM MRN: [id number] Endoscopist: Aloha Finner , MD, [id number] Age: 63 Referring MD:  Date of Birth: 13-Dec-1960 Gender: Female Account #: [id number] Procedure:                Colonoscopy Indications:              Determine extent and severity of inflammatory bowel                            disease, Change in bowel habits Medicines:                Monitored Anesthesia Care Procedure:                Pre-Anesthesia Assessment:                           - Prior to the procedure, a History and Physical                            was performed, and patient medications and                            allergies were reviewed. The patient's tolerance of                            previous anesthesia was also reviewed. The risks                            and benefits of the procedure and the sedation                            options and risks were discussed with the patient.                            All questions were answered, and informed consent                            was obtained. Prior Anticoagulants: The patient has                            taken no anticoagulant or antiplatelet agents. ASA                            Grade Assessment: II - A patient with mild systemic                            disease. After reviewing the risks and benefits,                            the patient was deemed in satisfactory condition to                            undergo the procedure.  After obtaining informed consent, the colonoscope                            was passed under direct vision. Throughout the                            procedure, the patient's blood pressure, pulse, and                            oxygen saturations were monitored continuously. The                            Olympus Scope SN [id number] was introduced through the                            anus and advanced  to the 4 cm into the ileum. The                            colonoscopy was performed without difficulty. The                            patient tolerated the procedure. The quality of the                            bowel preparation was adequate. The terminal ileum,                            ileocecal valve, appendiceal orifice, and rectum                            were photographed. Scope In: 1:25:44 PM Scope Out: 1:48:27 PM Scope Withdrawal Time: 0 hours 20 minutes 43 seconds  Total Procedure Duration: 0 hours 22 minutes 43 seconds  Findings:                 The digital rectal exam findings include                            hemorrhoids. Pertinent negatives include no                            palpable rectal lesions.                           Diffuse moderate mucosal changes characterized by                            congestion (edema), friability, granularity and                            scarring (at 2 cm into the ICV) were found in the                            terminal ileum. Biopsies were taken with a cold  forceps for histology and placed in jar 3.                           Two sessile polyps were found in the recto-sigmoid                            colon and sigmoid colon. The polyps were 2 to 3 mm                            in size. These polyps were removed with a cold                            snare. Resection and retrieval were complete.                           Multiple small-mouthed diverticula were found in                            the entire colon.                           Normal mucosa was found in the entire colon                            otherwise. Biopsies were taken with a cold forceps                            for histology from the right colon and placed in                            jar 4, from the left colon and placed in jar 5,                            from the rectum and placed in jar 7.                            Non-bleeding non-thrombosed external and internal                            hemorrhoids were found during retroflexion, during                            perianal exam and during digital exam. The                            hemorrhoids were Grade II (internal hemorrhoids                            that prolapse but reduce spontaneously). Complications:            No immediate complications. Estimated Blood Loss:     Estimated blood loss was minimal. Impression:               - Hemorrhoids found on digital  rectal exam.                           - Moderate mucosal changes were found in the ileum                            and terminal ileum with scarring 2 cm into the ICV                            secondary to ileitis. Biopsied.                           - Two 2 to 3 mm polyps at the recto-sigmoid colon                            and in the sigmoid colon, removed with a cold                            snare. Resected and retrieved.                           - Diverticulosis in the entire examined colon.                           - Normal mucosa in the entire examined colon.                            Biopsied.                           - Non-bleeding non-thrombosed external and internal                            hemorrhoids. Recommendation:           - The patient will be observed post-procedure,                            until all discharge criteria are met.                           - Discharge patient to home.                           - Patient has a contact number available for                            emergencies. The signs and symptoms of potential                            delayed complications were discussed with the                            patient. Return to normal activities tomorrow.  Written discharge instructions were provided to the                            patient.                           - High fiber diet.                           -  Await pathology results.                           - Repeat colonoscopy for surveillance based on                            pathology results.                           - The findings and recommendations were discussed                            with the patient.                           - The findings and recommendations were discussed                            with the designated responsible adult. Aloha Finner, MD 04/18/2023 2:02:58 PM

## 2023-04-18 NOTE — Progress Notes (Signed)
 Called to room to assist during endoscopic procedure.  Patient ID and intended procedure confirmed with present staff. Received instructions for my participation in the procedure from the performing physician.

## 2023-04-18 NOTE — Patient Instructions (Addendum)
 Handouts Provided:  Polyps, Diverticulosis and High Fiber Diet  START taking Nexium  40 mg once daily.  Medication has been sent to your pharmacy.  START taking Carafate  twice daily.  Medication has been sent to your pharmacy.  YOU HAD AN ENDOSCOPIC PROCEDURE TODAY AT THE [facility]:   Refer to the procedure report that was given to you for any specific questions about what was found during the examination.  If the procedure report does not answer your questions, please call your gastroenterologist to clarify.  If you requested that your care partner not be given the details of your procedure findings, then the procedure report has been included in a sealed envelope for you to review at your convenience later.  YOU SHOULD EXPECT: Some feelings of bloating in the abdomen. Passage of more gas than usual.  Walking can help get rid of the air that was put into your GI tract during the procedure and reduce the bloating. If you had a lower endoscopy (such as a colonoscopy or flexible sigmoidoscopy) you may notice spotting of blood in your stool or on the toilet paper. If you underwent a bowel prep for your procedure, you may not have a normal bowel movement for a few days.  Please Note:  You might notice some irritation and congestion in your nose or some drainage.  This is from the oxygen used during your procedure.  There is no need for concern and it should clear up in a day or so.  SYMPTOMS TO REPORT IMMEDIATELY:  Following lower endoscopy (colonoscopy or flexible sigmoidoscopy):  Excessive amounts of blood in the stool  Significant tenderness or worsening of abdominal pains  Swelling of the abdomen that is new, acute  Fever of 100F or higher  Following upper endoscopy (EGD)  Vomiting of blood or coffee ground material  New chest pain or pain under the shoulder blades  Painful or persistently difficult swallowing  New shortness of breath  Fever of 100F or higher  Black,  tarry-looking stools  For urgent or emergent issues, a gastroenterologist can be reached at any hour by calling [phone]. Do not use MyChart messaging for urgent concerns.    DIET:  We do recommend a small meal at first, but then you may proceed to your regular diet.  Drink plenty of fluids but you should avoid alcoholic beverages for 24 hours.  ACTIVITY:  You should plan to take it easy for the rest of today and you should NOT DRIVE or use heavy machinery until tomorrow (because of the sedation medicines used during the test).    FOLLOW UP: Our staff will call the number listed on your records the next business day following your procedure.  We will call around 7:15- 8:00 am to check on you and address any questions or concerns that you may have regarding the information given to you following your procedure. If we do not reach you, we will leave a message.     If any biopsies were taken you will be contacted by phone or by letter within the next 1-3 weeks.  Please call us  at [phone] if you have not heard about the biopsies in 3 weeks.    SIGNATURES/CONFIDENTIALITY: You and/or your care partner have signed paperwork which will be entered into your electronic medical record.  These signatures attest to the fact that that the information above on your After Visit Summary has been reviewed and is understood.  Full responsibility of the confidentiality of  this discharge information lies with you and/or your care-partner.

## 2023-04-18 NOTE — Progress Notes (Signed)
 GASTROENTEROLOGY PROCEDURE H&P NOTE   Primary Care Physician: Kate Lonni CROME, MD  HPI: Lindsey Massey is a 63 y.o. female who presents for EGD/colonoscopy for evaluation of GERD and previous reported history of terminal ileitis/Crohn's disease (all diagnosed previously in [place]).  Past Medical History:  Diagnosis Date   Asthma    Cataract    Crohn disease (HCC)    Depressed    GERD (gastroesophageal reflux disease)    High cholesterol    Hx of tonsillectomy    Hypercholesteremia    Hypertension    Osteoporosis    Shortness of breath    sometimes before sleep   Uterine fibroid    Past Surgical History:  Procedure Laterality Date   DILATION AND CURRETTAGE     TONSILLECTOMY     age 11   TONSILLECTOMY AND ADENOIDECTOMY     Current Outpatient Medications  Medication Sig Dispense Refill   ALPRAZolam  (XANAX ) 1 MG tablet Take 1 mg by mouth at bedtime as needed for anxiety. As needed     aspirin  EC 81 MG tablet Take 1 tablet (81 mg total) by mouth daily. Swallow whole. 30 tablet 0   azaTHIOprine  (IMURAN ) 50 MG tablet Take 1 tablet (50 mg total) by mouth daily. 30 tablet 3   calcium  carbonate (CALCIUM  ANTACID) 500 MG chewable tablet Chew 1 tablet (200 mg of elemental calcium  total) by mouth daily. (Patient not taking: Reported on 03/27/2023) 30 tablet 0   candesartan  (ATACAND ) 8 MG tablet Take 1 tablet (8 mg total) by mouth daily. 90 tablet 3   Cholecalciferol  (VITAMIN D3) 125 MCG (5000 UT) CAPS Take 1 capsule (5,000 Units total) by mouth daily. 30 capsule 0   Cyanocobalamin (VITAMIN B 12 PO) Take by mouth.     ibandronate  (BONIVA ) 150 MG tablet Take 1 tablet (150 mg total) by mouth every 30 (thirty) days. Take in the morning with a full glass of water, on an empty stomach, and do not take anything else by mouth or lie down for the next 30 min. 1 tablet 0   Magnesium  250 MG CAPS Take by mouth. Takes 500     mirtazapine  (REMERON ) 15 MG tablet Take 1 tablet (15 mg total) by  mouth at bedtime. 30 tablet 0   sertraline  (ZOLOFT ) 50 MG tablet Take 1 tablet (50 mg total) by mouth daily. 30 tablet 0   simvastatin  (ZOCOR ) 20 MG tablet Take 0.5 tablets (10 mg total) by mouth daily. 30 tablet 0   No current facility-administered medications for this visit.    Current Outpatient Medications:    ALPRAZolam  (XANAX ) 1 MG tablet, Take 1 mg by mouth at bedtime as needed for anxiety. As needed, Disp: , Rfl:    aspirin  EC 81 MG tablet, Take 1 tablet (81 mg total) by mouth daily. Swallow whole., Disp: 30 tablet, Rfl: 0   azaTHIOprine  (IMURAN ) 50 MG tablet, Take 1 tablet (50 mg total) by mouth daily., Disp: 30 tablet, Rfl: 3   calcium  carbonate (CALCIUM  ANTACID) 500 MG chewable tablet, Chew 1 tablet (200 mg of elemental calcium  total) by mouth daily. (Patient not taking: Reported on 03/27/2023), Disp: 30 tablet, Rfl: 0   candesartan  (ATACAND ) 8 MG tablet, Take 1 tablet (8 mg total) by mouth daily., Disp: 90 tablet, Rfl: 3   Cholecalciferol  (VITAMIN D3) 125 MCG (5000 UT) CAPS, Take 1 capsule (5,000 Units total) by mouth daily., Disp: 30 capsule, Rfl: 0   Cyanocobalamin (VITAMIN B 12 PO), Take by mouth., Disp: ,  Rfl:    ibandronate  (BONIVA ) 150 MG tablet, Take 1 tablet (150 mg total) by mouth every 30 (thirty) days. Take in the morning with a full glass of water, on an empty stomach, and do not take anything else by mouth or lie down for the next 30 min., Disp: 1 tablet, Rfl: 0   Magnesium  250 MG CAPS, Take by mouth. Takes 500, Disp: , Rfl:    mirtazapine  (REMERON ) 15 MG tablet, Take 1 tablet (15 mg total) by mouth at bedtime., Disp: 30 tablet, Rfl: 0   sertraline  (ZOLOFT ) 50 MG tablet, Take 1 tablet (50 mg total) by mouth daily., Disp: 30 tablet, Rfl: 0   simvastatin  (ZOCOR ) 20 MG tablet, Take 0.5 tablets (10 mg total) by mouth daily., Disp: 30 tablet, Rfl: 0 No Known Allergies Family History  Problem Relation Age of Onset   Arthritis Mother    Hypertension Mother    Heart disease  Mother    Cancer Father        liver   Hypertension Father    Arthritis Sister    Hypertension Brother    Multiple sclerosis Brother    Heart disease Paternal Uncle    Anesthesia problems Neg Hx    Colon cancer Neg Hx    Esophageal cancer Neg Hx    Inflammatory bowel disease Neg Hx    Liver disease Neg Hx    Pancreatic cancer Neg Hx    Rectal cancer Neg Hx    Stomach cancer Neg Hx    Colon polyps Neg Hx    Social History   Socioeconomic History   Marital status: Divorced    Spouse name: Not on file   Number of children: Not on file   Years of education: Not on file   Highest education level: 12th grade  Occupational History   Not on file  Tobacco Use   Smoking status: Every Day    Current packs/day: 5.00    Average packs/day: 5.0 packs/day for 11.0 years (55.1 ttl pk-yrs)    Types: Cigarettes    Start date: 2014   Smokeless tobacco: Not on file  Vaping Use   Vaping status: Never Used  Substance and Sexual Activity   Alcohol use: No   Drug use: No   Sexual activity: Not Currently  Other Topics Concern   Not on file  Social History Narrative   Lives with sister and nephews (18,16) in [place]. Moved from [place]  in April 2012. Separated from husband. No children. Does not work.      Balanced diet. Little exercise.      Christian faith.   Social Drivers of Health   Financial Resource Strain: High Risk (03/10/2023)   Overall Financial Resource Strain (CARDIA)    Difficulty of Paying Living Expenses: Very hard  Food Insecurity: Food Insecurity Present (03/10/2023)   Hunger Vital Sign    Worried About Running Out of Food in the Last Year: Often true    Ran Out of Food in the Last Year: Sometimes true  Transportation Needs: Unmet Transportation Needs (03/10/2023)   PRAPARE - Administrator, Civil Service (Medical): Yes    Lack of Transportation (Non-Medical): Yes  Physical Activity: Sufficiently Active (03/10/2023)   Exercise Vital Sign    Days of  Exercise per Week: 3 days    Minutes of Exercise per Session: 60 min  Stress: Stress Concern Present (03/10/2023)   [employer] of Occupational Health - Occupational Stress Questionnaire    Feeling  of Stress : Very much  Social Connections: Moderately Isolated (03/10/2023)   Social Connection and Isolation Panel [NHANES]    Frequency of Communication with Friends and Family: More than three times a week    Frequency of Social Gatherings with Friends and Family: More than three times a week    Attends Religious Services: More than 4 times per year    Active Member of [employer] or Organizations: No    Attends Engineer, Structural: Not on file    Marital Status: Divorced  Intimate Partner Violence: Not on file    Physical Exam: There were no vitals filed for this visit. There is no height or weight on file to calculate BMI. GEN: NAD EYE: Sclerae anicteric ENT: MMM CV: Non-tachycardic GI: Soft, NT/ND NEURO:  Alert & Oriented x 3  Lab Results: No results for input(s): WBC, HGB, HCT, PLT in the last 72 hours. BMET No results for input(s): NA, K, CL, CO2, GLUCOSE, BUN, CREATININE, CALCIUM  in the last 72 hours. LFT No results for input(s): PROT, ALBUMIN, AST, ALT, ALKPHOS, BILITOT, BILIDIR, IBILI in the last 72 hours. PT/INR No results for input(s): LABPROT, INR in the last 72 hours.   Impression / Plan: This is a 63 y.o.female who presents for EGD/colonoscopy for evaluation of GERD and previous reported history of terminal ileitis/Crohn's disease (all diagnosed previously in [place]).  The risks and benefits of endoscopic evaluation/treatment were discussed with the patient and/or family; these include but are not limited to the risk of perforation, infection, bleeding, missed lesions, lack of diagnosis, severe illness requiring hospitalization, as well as anesthesia and sedation related illnesses.  The patient's history has been  reviewed, patient examined, no change in status, and deemed stable for procedure.  The patient and/or family is agreeable to proceed.    Aloha Finner, MD [facility] Office # [phone]

## 2023-04-18 NOTE — Progress Notes (Signed)
 Report to PACU, RN, vss, BBS= Clear.

## 2023-04-18 NOTE — Op Note (Signed)
 [facility] Patient Name: Lindsey Massey Procedure Date: 04/18/2023 1:12 PM MRN: [id number] Endoscopist: Aloha Finner , MD, [id number] Age: 63 Referring MD:  Date of Birth: 1960/06/01 Gender: Female Account #: [id number] Procedure:                Upper GI endoscopy Indications:              Heartburn, Esophageal reflux symptoms that persist                            despite appropriate therapy Medicines:                Monitored Anesthesia Care Procedure:                Pre-Anesthesia Assessment:                           - Prior to the procedure, a History and Physical                            was performed, and patient medications and                            allergies were reviewed. The patient's tolerance of                            previous anesthesia was also reviewed. The risks                            and benefits of the procedure and the sedation                            options and risks were discussed with the patient.                            All questions were answered, and informed consent                            was obtained. Prior Anticoagulants: The patient has                            taken no anticoagulant or antiplatelet agents                            except for aspirin . ASA Grade Assessment: II - A                            patient with mild systemic disease. After reviewing                            the risks and benefits, the patient was deemed in                            satisfactory condition to undergo the procedure.  After obtaining informed consent, the endoscope was                            passed under direct vision. Throughout the                            procedure, the patient's blood pressure, pulse, and                            oxygen saturations were monitored continuously. The                            GIF [id number] #[id number] was introduced through the                             mouth, and advanced to the second part of duodenum.                            The upper GI endoscopy was accomplished without                            difficulty. The patient tolerated the procedure. Scope In: Scope Out: Findings:                 No gross lesions were noted in the entire esophagus.                           The Z-line was irregular and was found 35 cm from                            the incisors.                           A 1 cm hiatal hernia was present.                           Diffuse moderate inflammation characterized by                            erosions, erythema and granularity was found in the                            entire examined stomach. Biopsies were taken with a                            cold forceps for histology and Helicobacter pylori                            testing.                           No gross lesions were noted in the duodenal bulb,                            in the  first portion of the duodenum and in the                            second portion of the duodenum. Biopsies were taken                            with a cold forceps for histology. Complications:            No immediate complications. Estimated Blood Loss:     Estimated blood loss was minimal. Impression:               - No gross lesions in the entire esophagus. Z-line                            irregular, 35 cm from the incisors.                           - 1 cm hiatal hernia.                           - Gastritis. Biopsied.                           - No gross lesions in the duodenal bulb, in the                            first portion of the duodenum and in the second                            portion of the duodenum. Biopsied. Recommendation:           - Proceed to scheduled colonoscopy.                           - Continue present medications.                           - Initiate Nexium  40 mg once daily.                           - Initiate Carafate  twice daily  for 1 month.                           - Continue present medications.                           - Await pathology results.                           - The findings and recommendations were discussed                            with the patient.                           - The findings and recommendations were discussed  with the designated responsible adult. Aloha Finner, MD 04/18/2023 1:58:21 PM

## 2023-04-18 NOTE — Progress Notes (Signed)
 Pt's states no medical or surgical changes since previsit or office visit.

## 2023-04-21 ENCOUNTER — Other Ambulatory Visit: Payer: Self-pay | Admitting: Emergency Medicine

## 2023-04-21 ENCOUNTER — Telehealth: Payer: Self-pay | Admitting: *Deleted

## 2023-04-21 MED ORDER — IBANDRONATE SODIUM 150 MG PO TABS: 150.0000 mg | ORAL_TABLET | ORAL | 0 refills | Status: DC

## 2023-04-21 MED ORDER — CANDESARTAN CILEXETIL 8 MG PO TABS: 8.0000 mg | ORAL_TABLET | Freq: Every day | ORAL | 3 refills | Status: DC

## 2023-04-21 MED ORDER — SIMVASTATIN 20 MG PO TABS: 10.0000 mg | ORAL_TABLET | Freq: Every day | ORAL | 0 refills | Status: DC

## 2023-04-21 MED ORDER — MIRTAZAPINE 15 MG PO TABS: 15.0000 mg | ORAL_TABLET | Freq: Every day | ORAL | 0 refills | Status: DC

## 2023-04-21 NOTE — Telephone Encounter (Signed)
 Copied from CRM [id number]. Topic: Clinical - Medication Refill >> Apr 21, 2023  1:30 PM Robinson H wrote: Most Recent Primary Care Visit:  Provider: PURCELL EMIL SCHANZ  Department: [facility]  Visit Type: NEW PATIENT  Date: 03/11/2023  Medication: sertraline  (ZOLOFT ) 50 MG tablet, ALPRAZolam  (XANAX ) 1 MG tablet, candesartan  (ATACAND ) 8 MG tablet, ibandronate  (BONIVA ) 150 MG tablet, mirtazapine  (REMERON ) 15 MG tablet  Has the patient contacted their pharmacy? Yes, no refills  (Agent: If no, request that the patient contact the pharmacy for the refill. If patient does not wish to contact the pharmacy document the reason why and proceed with request.) (Agent: If yes, when and what did the pharmacy advise?)  Is this the correct pharmacy for this prescription? Yes If no, delete pharmacy and type the correct one.  This is the patient's preferred pharmacy:  [id number] [place], [place] - [address] [place] [address] Phone: [phone] Fax: [phone]   Has the prescription been filled recently? No  Is the patient out of the medication? Yes  Has the patient been seen for an appointment in the last year OR does the patient have an upcoming appointment? Yes  Can we respond through MyChart? Yes  Agent: Please be advised that Rx refills may take up to 3 business days. We ask that you follow-up with your pharmacy.

## 2023-04-21 NOTE — Telephone Encounter (Signed)
  Follow up Call-     04/18/2023   12:50 PM  Call back number  Post procedure Call Back phone  # [phone]  Permission to leave phone message Yes     Patient questions:  Do you have a fever, pain , or abdominal swelling? Yes.   Pain Score   see below  *  Have you tolerated food without any problems? Yes.    Have you been able to return to your normal activities? Yes.    Do you have any questions about your discharge instructions: Diet   No. Medications  No. Follow up visit  No.  Do you have questions or concerns about your Care? No.  Actions: * If pain score is 4 or above: No action needed, pain <4.  Pt states her abdominal discomfort is like when you have a period; cramping.  There is difficulty with language- she is unable to understand when it comes to rating pain.  She states she can eat and sleep without difficulty.  I told her to call back if cramping discomfort increases.

## 2023-04-21 NOTE — Telephone Encounter (Signed)
 Find out from pharmacy what are the alternatives covered by Medicaid

## 2023-04-22 ENCOUNTER — Other Ambulatory Visit: Payer: Self-pay | Admitting: Emergency Medicine

## 2023-04-23 LAB — SURGICAL PATHOLOGY

## 2023-04-24 ENCOUNTER — Encounter: Payer: Self-pay | Admitting: Gastroenterology

## 2023-04-25 ENCOUNTER — Other Ambulatory Visit: Payer: Self-pay

## 2023-04-25 DIAGNOSIS — K50919 Crohn's disease, unspecified, with unspecified complications: Secondary | ICD-10-CM

## 2023-04-28 ENCOUNTER — Other Ambulatory Visit (HOSPITAL_COMMUNITY): Payer: Self-pay

## 2023-04-28 ENCOUNTER — Telehealth (HOSPITAL_COMMUNITY): Payer: Self-pay | Admitting: Pharmacy Technician

## 2023-04-28 NOTE — Telephone Encounter (Signed)
PA request has been Submitted. New Encounter created for follow up. For additional info see Pharmacy Prior Auth telephone encounter from 04/28/23.

## 2023-04-28 NOTE — Telephone Encounter (Signed)
Pharmacy Patient Advocate Encounter   Received notification from CoverMyMeds that prior authorization for Ibandronate Sodium 150MG  tablets is required/requested.   Insurance verification completed.   The patient is insured through  [facility] MEDICAID  .   Per test claim: PA required; PA submitted to above mentioned insurance via CoverMyMeds Key/confirmation #/EOC [id number] Status is pending

## 2023-04-30 ENCOUNTER — Ambulatory Visit: Payer: Medicaid Other | Admitting: Emergency Medicine

## 2023-04-30 ENCOUNTER — Other Ambulatory Visit: Payer: Self-pay | Admitting: *Deleted

## 2023-04-30 VITALS — BP 100/70 | Temp 98.2°F | Ht 66.0 in | Wt 172.0 lb

## 2023-04-30 DIAGNOSIS — K219 Gastro-esophageal reflux disease without esophagitis: Secondary | ICD-10-CM

## 2023-04-30 DIAGNOSIS — E785 Hyperlipidemia, unspecified: Secondary | ICD-10-CM

## 2023-04-30 DIAGNOSIS — E7800 Pure hypercholesterolemia, unspecified: Secondary | ICD-10-CM

## 2023-04-30 DIAGNOSIS — K5080 Crohn's disease of both small and large intestine without complications: Secondary | ICD-10-CM

## 2023-04-30 DIAGNOSIS — M810 Age-related osteoporosis without current pathological fracture: Secondary | ICD-10-CM | POA: Diagnosis not present

## 2023-04-30 DIAGNOSIS — K5000 Crohn's disease of small intestine without complications: Secondary | ICD-10-CM

## 2023-04-30 DIAGNOSIS — I10 Essential (primary) hypertension: Secondary | ICD-10-CM

## 2023-04-30 DIAGNOSIS — G8929 Other chronic pain: Secondary | ICD-10-CM

## 2023-04-30 DIAGNOSIS — M25512 Pain in left shoulder: Secondary | ICD-10-CM

## 2023-04-30 MED ORDER — MAGNESIUM 250 MG PO CAPS: 250.0000 mg | ORAL_CAPSULE | Freq: Every day | ORAL | 1 refills | Status: AC

## 2023-04-30 MED ORDER — SIMVASTATIN 10 MG PO TABS: 10.0000 mg | ORAL_TABLET | Freq: Every day | ORAL | 3 refills | Status: DC

## 2023-04-30 MED ORDER — SIMVASTATIN 20 MG PO TABS: 10.0000 mg | ORAL_TABLET | Freq: Every day | ORAL | 0 refills | Status: DC

## 2023-04-30 NOTE — Assessment & Plan Note (Signed)
Well-controlled hypertension Continue candesartan 8 mg daily Cardiovascular risks associated with hypertension discussed Diet and nutrition discussed

## 2023-04-30 NOTE — Assessment & Plan Note (Signed)
Chronic and affecting quality of life Recent x-ray shows osteoarthritis Recommend orthopedic evaluation Referral placed today

## 2023-04-30 NOTE — Assessment & Plan Note (Signed)
 Stable chronic condition Diet and nutrition discussed Continue simvastatin 10 mg daily

## 2023-04-30 NOTE — Assessment & Plan Note (Signed)
Stable.  Continues Boniva 150 mg monthly Last DEXA scan 2022 Needs follow-up study

## 2023-04-30 NOTE — Patient Instructions (Signed)

## 2023-04-30 NOTE — Assessment & Plan Note (Signed)
Recent upper endoscopy shows diffuse gastritis Continue Nexium 40 mg daily

## 2023-04-30 NOTE — Progress Notes (Signed)
Lindsey Massey 63 y.o.   Chief Complaint  Patient presents with   Results    Patient is to go over shoulder     HISTORY OF PRESENT ILLNESS: This is a 63 y.o. female here for follow-up of chronic medical conditions Has several questions regarding chronic pain to left shoulder.  Needs Ortho referral Was also able to follow-up with GI doctor.  Has history of Crohn's disease Had upper and lower endoscopies revealing diffuse gastritis and signs of Crohn's disease Had cardiac evaluation since her last visit with normal results.  No concerns.  HPI   Prior to Admission medications   Medication Sig Start Date End Date Taking? Authorizing Provider  aspirin EC 81 MG tablet Take 1 tablet (81 mg total) by mouth daily. Swallow whole. 01/24/23   Mansouraty, Netty Starring., MD  azaTHIOprine (IMURAN) 50 MG tablet Take 1 tablet (50 mg total) by mouth daily. 04/17/23   Mansouraty, Netty Starring., MD  calcium carbonate (CALCIUM ANTACID) 500 MG chewable tablet Chew 1 tablet (200 mg of elemental calcium total) by mouth daily. Patient not taking: Reported on 04/18/2023 01/24/23   Mansouraty, Netty Starring., MD  candesartan (ATACAND) 8 MG tablet Take 1 tablet (8 mg total) by mouth daily. 04/21/23   Georgina Quint, MD  Cholecalciferol (VITAMIN D3) 125 MCG (5000 UT) CAPS Take 1 capsule (5,000 Units total) by mouth daily. 01/24/23   Mansouraty, Netty Starring., MD  Cyanocobalamin (VITAMIN B 12 PO) Take by mouth.    [provider]  esomeprazole (NEXIUM) 40 MG capsule Take 1 capsule (40 mg total) by mouth daily at 12 noon. 04/18/23   Mansouraty, Netty Starring., MD  ibandronate (BONIVA) 150 MG tablet Take 1 tablet (150 mg total) by mouth every 30 (thirty) days. Take in the morning with a full glass of water, on an empty stomach, and do not take anything else by mouth or lie down for the next 30 min. 04/21/23   Georgina Quint, MD  Magnesium 250 MG CAPS Take by mouth. Takes 500    [provider]   mirtazapine (REMERON) 15 MG tablet Take 1 tablet (15 mg total) by mouth at bedtime. 04/21/23   Georgina Quint, MD  sertraline (ZOLOFT) 50 MG tablet Take 1 tablet (50 mg total) by mouth daily. 01/24/23   Mansouraty, Netty Starring., MD  simvastatin (ZOCOR) 10 MG tablet Take 1 tablet (10 mg total) by mouth at bedtime. 04/30/23 07/29/23  Little Ishikawa, MD  simvastatin (ZOCOR) 20 MG tablet Take 0.5 tablets (10 mg total) by mouth daily. 04/21/23   Georgina Quint, MD  sucralfate (CARAFATE) 1 g tablet Take 1 tablet (1 g total) by mouth 2 (two) times daily. 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) 01/08/2023   Immunosuppression due to drug therapy (HCC) 01/08/2023   Dyslipidemia 09/16/2011   Osteoporosis 09/16/2011   GERD without esophagitis 09/16/2011   Uterine fibroid 06/26/2011   Depression 06/26/2011   Hypercholesterolemia 06/26/2011    Past Medical History:  Diagnosis Date   Asthma    Cataract    Crohn disease (HCC)    Depressed    GERD (gastroesophageal reflux disease)    High cholesterol    Hx of tonsillectomy    Hypercholesteremia    Hypertension    Osteoporosis  Shortness of breath    sometimes before sleep   Uterine fibroid     Past Surgical History:  Procedure Laterality Date   DILATION AND CURRETTAGE     TONSILLECTOMY     age 51   TONSILLECTOMY AND ADENOIDECTOMY      Social History   Socioeconomic History   Marital status: Divorced    Spouse name: Not on file   Number of children: Not on file   Years of education: Not on file   Highest education level: 12th grade  Occupational History   Not on file  Tobacco Use   Smoking status: Every Day    Current packs/day: 5.00    Average packs/day: 5.0 packs/day for 11.1 years (55.3 ttl pk-yrs)     Types: Cigarettes    Start date: 2014   Smokeless tobacco: Not on file  Vaping Use   Vaping status: Never Used  Substance and Sexual Activity   Alcohol use: No   Drug use: No   Sexual activity: Not Currently  Other Topics Concern   Not on file  Social History Narrative   Lives with sister and nephews (18,16) in [place]. Moved from [place] in April 2012. Separated from husband. No children. Does not work.      Balanced diet. Little exercise.      Christian faith.   Social Drivers of Corporate investment banker Strain: High Risk (04/30/2023)   Overall Financial Resource Strain (CARDIA)    Difficulty of Paying Living Expenses: Very hard  Food Insecurity: Food Insecurity Present (04/30/2023)   Hunger Vital Sign    Worried About Running Out of Food in the Last Year: Often true    Ran Out of Food in the Last Year: Sometimes true  Transportation Needs: Unmet Transportation Needs (04/30/2023)   PRAPARE - Administrator, Civil Service (Medical): Yes    Lack of Transportation (Non-Medical): Yes  Physical Activity: Insufficiently Active (04/30/2023)   Exercise Vital Sign    Days of Exercise per Week: 2 days    Minutes of Exercise per Session: 10 min  Stress: Stress Concern Present (04/30/2023)   [employer] of Occupational Health - Occupational Stress Questionnaire    Feeling of Stress : Very much  Social Connections: Unknown (04/30/2023)   Social Connection and Isolation Panel [NHANES]    Frequency of Communication with Friends and Family: Twice a week    Frequency of Social Gatherings with Friends and Family: Patient declined    Attends Religious Services: Never    Database administrator or Organizations: No    Attends Engineer, structural: Not on file    Marital Status: Living with partner  Recent Concern: Social Connections - Moderately Isolated (03/10/2023)   Social Connection and Isolation Panel [NHANES]    Frequency of Communication with Friends and  Family: More than three times a week    Frequency of Social Gatherings with Friends and Family: More than three times a week    Attends Religious Services: More than 4 times per year    Active Member of [employer] or Organizations: No    Attends Engineer, structural: Not on file    Marital Status: Divorced  Catering manager Violence: Not on file    Family History  Problem Relation Age of Onset   Arthritis Mother    Hypertension Mother    Heart disease Mother    Cancer Father        liver   Hypertension  Father    Arthritis Sister    Hypertension Brother    Multiple sclerosis Brother    Heart disease Paternal Uncle    Anesthesia problems Neg Hx    Colon cancer Neg Hx    Esophageal cancer Neg Hx    Inflammatory bowel disease Neg Hx    Liver disease Neg Hx    Pancreatic cancer Neg Hx    Rectal cancer Neg Hx    Stomach cancer Neg Hx    Colon polyps Neg Hx      Review of Systems  Constitutional: Negative.  Negative for chills and fever.  HENT: Negative.  Negative for congestion and sore throat.   Respiratory: Negative.  Negative for cough and shortness of breath.   Cardiovascular: Negative.  Negative for chest pain and palpitations.  Gastrointestinal:  Negative for abdominal pain, nausea and vomiting.  Genitourinary: Negative.  Negative for dysuria and hematuria.  Musculoskeletal:  Positive for joint pain (Chronic pain left shoulder).  Skin: Negative.  Negative for rash.  Neurological: Negative.  Negative for dizziness and headaches.  All other systems reviewed and are negative.   Today's Vitals   04/30/23 1301  BP: 100/70  Temp: 98.2 F (36.8 C)  TempSrc: Oral  Weight: 172 lb (78 kg)  Height: 5\' 6"  (1.676 m)   Body mass index is 27.76 kg/m.   Physical Exam Vitals reviewed.  Constitutional:      Appearance: Normal appearance.  HENT:     Head: Normocephalic.  Eyes:     Extraocular Movements: Extraocular movements intact.  Cardiovascular:     Rate and  Rhythm: Normal rate.  Pulmonary:     Effort: Pulmonary effort is normal.  Skin:    General: Skin is warm and dry.     Comments: Onychomycosis left big toenail  Neurological:     Mental Status: She is alert and oriented to person, place, and time.  Psychiatric:        Mood and Affect: Mood normal.        Behavior: Behavior normal.      ASSESSMENT & PLAN: A total of 47 minutes was spent with the patient and counseling/coordination of care regarding preparing for this visit, review of most recent office visit notes, review of most recent visits with cardiologist and gastroenterologist, review of most recent reports of upper and lower endoscopies, echocardiograms, cardiac CT scan, pathology reports, review of multiple chronic medical conditions and their management, review of all medications, review of most recent bloodwork results, review of health maintenance items, education on nutrition, prognosis, documentation, and need for follow up.   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  Sees GI doctor on a regular basis Currently on Imuran 50 mg daily      Crohn's disease of both small and large intestine without complication (HCC) - Primary   Recently seen by GI doctor Recent upper and lower endoscopy reports reviewed Crohn's medications handled by GI's office        Musculoskeletal and  Integument   Osteoporosis   Stable.  Continues Boniva 150 mg monthly Last DEXA scan 2022 Needs follow-up study       Relevant Orders   DG Bone Density     Other   Hypercholesterolemia   Relevant Medications   simvastatin (ZOCOR) 20 MG tablet   Dyslipidemia   Stable chronic condition Diet and nutrition discussed Continue simvastatin 10 mg daily      Relevant Medications   simvastatin (ZOCOR) 20 MG tablet   Chronic left shoulder pain   Chronic and affecting quality of life Recent x-ray shows osteoarthritis Recommend orthopedic evaluation Referral placed today      Relevant Orders   Ambulatory referral to Orthopedic Surgery   Patient Instructions  Health Maintenance, Female Adopting a healthy lifestyle and getting preventive care are important in promoting health and wellness. Ask your health care provider about: The right schedule for you to have regular tests and exams. Things you can do on your own to prevent diseases and keep yourself healthy. What should I know about diet, weight, and exercise? Eat a healthy diet  Eat a diet that includes plenty of vegetables, fruits, low-fat dairy products, and lean protein. Do not eat a lot of foods that are high in solid fats, added sugars, or sodium. Maintain a healthy weight Body mass index (BMI) is used to identify weight problems. It estimates body fat based on height and weight. Your health care provider can help determine your BMI and help you achieve or maintain a healthy weight. Get regular exercise Get regular exercise. This is one of the most important things you can do for your health. Most adults should: Exercise for at least 150 minutes each week. The exercise should increase your heart rate and make you sweat (moderate-intensity exercise). Do strengthening exercises at least twice a week. This is in addition to the moderate-intensity exercise. Spend less time sitting. Even light physical activity can be beneficial. Watch cholesterol and blood lipids Have your blood tested for lipids and cholesterol at 62 years of age, then have this  test every 5 years. Have your cholesterol levels checked more often if: Your lipid or cholesterol levels are high. You are older than 62 years of age. You are at high risk for heart disease. What should I know about cancer screening? Depending on your health history and family history, you may need to have cancer screening at various ages. This may include screening for: Breast cancer. Cervical cancer. Colorectal cancer. Skin cancer. Lung cancer. What should I know about heart disease, diabetes, and high blood pressure? Blood pressure and heart disease High blood pressure causes heart disease and increases the risk of stroke. This is more likely to develop in people who have high blood pressure readings or are overweight. Have your blood pressure checked: Every 3-5 years if you are 38-22 years of age. Every year if you are 19 years old or older. Diabetes Have regular diabetes screenings. This checks your fasting blood sugar level. Have the screening done: Once every three years after age 62 if you are at a normal weight and have a low risk for diabetes. More often and at a younger age if you are overweight or have a high risk for diabetes. What should I know about preventing infection? Hepatitis B If you have a higher risk for hepatitis B, you should be screened for this virus. Talk with your health care provider to find out if you are at risk for hepatitis B infection. Hepatitis  C Testing is recommended for: Everyone born from [birth year] through 1964. Anyone with known risk factors for hepatitis C. Sexually transmitted infections (STIs) Get screened for STIs, including gonorrhea and chlamydia, if: You are sexually active and are younger than 62 years of age. You are older than 62 years of age and your health care provider tells you that you are at risk for this type of infection. Your sexual activity has changed since you were last screened, and you are at increased risk for chlamydia or  gonorrhea. Ask your health care provider if you are at risk. Ask your health care provider about whether you are at high risk for HIV. Your health care provider may recommend a prescription medicine to help prevent HIV infection. If you choose to take medicine to prevent HIV, you should first get tested for HIV. You should then be tested every 3 months for as long as you are taking the medicine. Pregnancy If you are about to stop having your period (premenopausal) and you may become pregnant, seek counseling before you get pregnant. Take 400 to 800 micrograms (mcg) of folic acid every day if you become pregnant. Ask for birth control (contraception) if you want to prevent pregnancy. Osteoporosis and menopause Osteoporosis is a disease in which the bones lose minerals and strength with aging. This can result in bone fractures. If you are 72 years old or older, or if you are at risk for osteoporosis and fractures, ask your health care provider if you should: Be screened for bone loss. Take a calcium or vitamin D supplement to lower your risk of fractures. Be given hormone replacement therapy (HRT) to treat symptoms of menopause. Follow these instructions at home: Alcohol use Do not drink alcohol if: Your health care provider tells you not to drink. You are pregnant, may be pregnant, or are planning to become pregnant. If you drink alcohol: Limit how much you have to: 0-1 drink a day. Know how much alcohol is in your drink. In the [place] one drink equals one 12 oz bottle of beer (355 mL), one 5 oz glass of wine (148 mL), or one 1 oz glass of hard liquor (44 mL). Lifestyle Do not use any products that contain nicotine or tobacco. These products include cigarettes, chewing tobacco, and vaping devices, such as e-cigarettes. If you need help quitting, ask your health care provider. Do not use street drugs. Do not share needles. Ask your health care provider for help if you need support or  information about quitting drugs. General instructions Schedule regular health, dental, and eye exams. Stay current with your vaccines. Tell your health care provider if: You often feel depressed. You have ever been abused or do not feel safe at home. Summary Adopting a healthy lifestyle and getting preventive care are important in promoting health and wellness. Follow your health care provider's instructions about healthy diet, exercising, and getting tested or screened for diseases. Follow your health care provider's instructions on monitoring your cholesterol and blood pressure. This information is not intended to replace advice given to you by your health care provider. Make sure you discuss any questions you have with your health care provider. Document Revised: 08/14/2020 Document Reviewed: 08/14/2020 Elsevier Patient Education  2024 Elsevier Inc.    Edwina Barth, MD [facility]

## 2023-04-30 NOTE — Assessment & Plan Note (Signed)
Recently seen by GI doctor Recent upper and lower endoscopy reports reviewed Crohn's medications handled by [facility] office

## 2023-04-30 NOTE — Assessment & Plan Note (Signed)
 Stable and well-controlled.  Sees GI doctor on a regular basis Currently on Imuran 50 mg daily

## 2023-05-05 ENCOUNTER — Ambulatory Visit (INDEPENDENT_AMBULATORY_CARE_PROVIDER_SITE_OTHER)
Admission: RE | Admit: 2023-05-05 | Discharge: 2023-05-05 | Disposition: A | Discharge status: Home / Self Care | Payer: Medicaid Other | Source: Ambulatory Visit | Attending: Emergency Medicine

## 2023-05-05 DIAGNOSIS — M810 Age-related osteoporosis without current pathological fracture: Secondary | ICD-10-CM | POA: Diagnosis not present

## 2023-05-06 ENCOUNTER — Other Ambulatory Visit (INDEPENDENT_AMBULATORY_CARE_PROVIDER_SITE_OTHER): Payer: Medicaid Other

## 2023-05-06 ENCOUNTER — Telehealth: Payer: Self-pay | Admitting: Radiology

## 2023-05-06 ENCOUNTER — Ambulatory Visit (INDEPENDENT_AMBULATORY_CARE_PROVIDER_SITE_OTHER): Payer: Medicaid Other | Admitting: Physician Assistant

## 2023-05-06 DIAGNOSIS — M542 Cervicalgia: Secondary | ICD-10-CM

## 2023-05-06 DIAGNOSIS — M5410 Radiculopathy, site unspecified: Secondary | ICD-10-CM

## 2023-05-06 MED ORDER — PREDNISONE 10 MG (21) PO TBPK: ORAL_TABLET | ORAL | 0 refills | Status: DC

## 2023-05-06 MED ORDER — TRAMADOL HCL 50 MG PO TABS: 50.0000 mg | ORAL_TABLET | Freq: Two times a day (BID) | ORAL | 0 refills | Status: DC | PRN

## 2023-05-06 MED ORDER — METHOCARBAMOL 750 MG PO TABS: 750.0000 mg | ORAL_TABLET | Freq: Two times a day (BID) | ORAL | 1 refills | Status: AC | PRN

## 2023-05-06 NOTE — Progress Notes (Signed)
Patient has osteoporosis.  Call patient with results.  Recommend Prolia injections.  Please put an order for this.  Thanks.

## 2023-05-06 NOTE — Progress Notes (Signed)
Office Visit Note   Patient: Lindsey Massey           Date of Birth: [birth year]           MRN: [id number] Visit Date: 05/06/2023              Requested by: Georgina Quint, MD [address] [place],  [place] [address] PCP: Georgina Quint, MD   Assessment & Plan: Visit Diagnoses:  1. Neck pain   2. Radiculopathy of arm     Plan: Impression is chronic neck and left shoulder pain.  It is hard to tell if her shoulder pain is referred from her neck or if it is a separate issue.  We have discussed treating this with steroids, muscle relaxers and physical therapy for which she is agreeable to.  She will follow-up with [place] as needed.  Call with concerns or questions.  Follow-Up Instructions: Return if symptoms worsen or fail to improve.   Orders:  Orders Placed This Encounter  Procedures   XR Cervical Spine 2 or 3 views   Ambulatory referral to Physical Therapy   Meds ordered this encounter  Medications   predniSONE (STERAPRED UNI-PAK 21 TAB) 10 MG (21) TBPK tablet    Sig: Take as directed    Dispense:  21 tablet    Refill:  0   methocarbamol (ROBAXIN-750) 750 MG tablet    Sig: Take 1 tablet (750 mg total) by mouth 2 (two) times daily as needed for muscle spasms.    Dispense:  20 tablet    Refill:  1   traMADol (ULTRAM) 50 MG tablet    Sig: Take 1 tablet (50 mg total) by mouth 2 (two) times daily as needed.    Dispense:  30 tablet    Refill:  0      Procedures: No procedures performed   Clinical Data: No additional findings.   Subjective: Chief Complaint  Patient presents with   Left Shoulder - Pain   Neck - Pain   Right Shoulder - Pain    HPI patient is a pleasant 63 year old Arabic speaking female here today with an interpreter.  She is here with neck pain and left shoulder pain.  Symptoms have been ongoing for about 6 years.  She denies any injury or change in activity.  The pain she has radiates from the lateral neck and into the top of her  shoulder.  Pain is constant but worse with movement of the neck and left shoulder.  She has not taking anything for pain.  She denies any weakness or paresthesias to the left upper extremity.  Review of Systems as detailed in HPI.  All others reviewed and are negative.   Objective: Vital Signs: LMP 12/27/2010   Physical Exam well-developed well-nourished female in no acute distress.  Alert and oriented x 3.  Ortho Exam neck exam: Left-sided paraspinous musculature tenderness.  Increased pain with flexion and extension as well as rotation.  Left shoulder exam: Pain in the left lateral neck with all motions of the left shoulder.  No focal weakness.  She is neurovascularly intact distally.  Specialty Comments:  No specialty comments available.  Imaging: XR Cervical Spine 2 or 3 views Result Date: 05/06/2023 X-rays demonstrate moderate multilevel degenerative changes.  Straightening of the cervical spine.    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) 01/08/2023   Immunosuppression due to drug therapy (HCC) 01/08/2023   Dyslipidemia 09/16/2011   Osteoporosis 09/16/2011   GERD without esophagitis 09/16/2011   Uterine fibroid 06/26/2011   Depression 06/26/2011   Hypercholesterolemia 06/26/2011   Past Medical History:  Diagnosis Date   Asthma    Cataract    Crohn disease (HCC)    Depressed    GERD (gastroesophageal reflux disease)    High cholesterol    Hx of tonsillectomy    Hypercholesteremia    Hypertension    Osteoporosis    Shortness of breath    sometimes before sleep   Uterine fibroid     Family History  Problem Relation Age of Onset   Arthritis Mother    Hypertension Mother    Heart  disease Mother    Cancer Father        liver   Hypertension Father    Arthritis Sister    Hypertension Brother    Multiple sclerosis Brother    Heart disease Paternal Uncle    Anesthesia problems Neg Hx    Colon cancer Neg Hx    Esophageal cancer Neg Hx    Inflammatory bowel disease Neg Hx    Liver disease Neg Hx    Pancreatic cancer Neg Hx    Rectal cancer Neg Hx    Stomach cancer Neg Hx    Colon polyps Neg Hx     Past Surgical History:  Procedure Laterality Date   DILATION AND CURRETTAGE     TONSILLECTOMY     age 2   TONSILLECTOMY AND ADENOIDECTOMY     Social History   Occupational History   Not on file  Tobacco Use   Smoking status: Every Day    Current packs/day: 5.00    Average packs/day: 5.0 packs/day for 11.1 years (55.4 ttl pk-yrs)    Types: Cigarettes    Start date: 2014   Smokeless tobacco: Not on file  Vaping Use   Vaping status: Never Used  Substance and Sexual Activity   Alcohol use: No   Drug use: No   Sexual activity: Not Currently

## 2023-05-06 NOTE — Telephone Encounter (Signed)
Patient has OSTEOPOROSIS PA needed for Prolia. Patient okay with starting injections

## 2023-05-07 ENCOUNTER — Other Ambulatory Visit (HOSPITAL_COMMUNITY): Payer: Self-pay

## 2023-05-07 ENCOUNTER — Telehealth: Payer: Self-pay

## 2023-05-07 ENCOUNTER — Encounter: Payer: Self-pay | Admitting: Radiology

## 2023-05-07 NOTE — Telephone Encounter (Signed)
Prolia BIV in separate encounter.

## 2023-05-07 NOTE — Telephone Encounter (Signed)
Prolia VOB initiated via [email or web address]

## 2023-05-08 ENCOUNTER — Other Ambulatory Visit (HOSPITAL_COMMUNITY): Payer: Self-pay

## 2023-05-08 NOTE — Telephone Encounter (Signed)
Marland Kitchen

## 2023-05-09 ENCOUNTER — Telehealth: Payer: Self-pay | Admitting: Emergency Medicine

## 2023-05-09 ENCOUNTER — Other Ambulatory Visit: Payer: Self-pay | Admitting: Radiology

## 2023-05-09 ENCOUNTER — Encounter: Payer: Self-pay | Admitting: Radiology

## 2023-05-09 ENCOUNTER — Other Ambulatory Visit (HOSPITAL_COMMUNITY): Payer: Self-pay

## 2023-05-09 DIAGNOSIS — E7800 Pure hypercholesterolemia, unspecified: Secondary | ICD-10-CM

## 2023-05-09 NOTE — Telephone Encounter (Signed)
Copied from CRM [id number]. Topic: Clinical - Medication Question >> May 09, 2023  1:55 PM Gurney Maxin H wrote: Reason for CRM: Patient is calling to verify medication dosage for the simvastatin (ZOCOR) 20 MG tablet, patient states he takes 1 tablet daily and not the  0.5 tablets (10 mg total) by mouth daily as stated by pharmacy and prescription. Please reach out to patient for clarity, thanks.  Judeth Porch [phone]

## 2023-05-09 NOTE — Telephone Encounter (Signed)
Pharmacy Patient Advocate Encounter  Received notification from [facility] that Prior Authorization for PROLIA has been DENIED.  See denial reason below. No denial letter attached in CMM. Will attach denial letter to Media tab once received.   PA #/Case ID/Reference #: [id number]

## 2023-05-09 NOTE — Telephone Encounter (Signed)
Pharmacy Patient Advocate Encounter   Received notification from  [employer] Portal that prior authorization for PROLIA is required/requested.   Insurance verification completed.   The patient is insured through [facility] .   Per test claim: PA required; PA submitted to above mentioned insurance via CoverMyMeds Key/confirmation #/EOC [id number] Status is pending

## 2023-05-11 ENCOUNTER — Other Ambulatory Visit: Payer: Self-pay | Admitting: Emergency Medicine

## 2023-05-11 DIAGNOSIS — E7800 Pure hypercholesterolemia, unspecified: Secondary | ICD-10-CM

## 2023-05-11 MED ORDER — SIMVASTATIN 20 MG PO TABS: 20.0000 mg | ORAL_TABLET | Freq: Every day | ORAL | 3 refills | Status: DC

## 2023-05-11 NOTE — Telephone Encounter (Signed)
 Thanks

## 2023-05-13 ENCOUNTER — Other Ambulatory Visit: Payer: Self-pay | Admitting: Gastroenterology

## 2023-05-13 ENCOUNTER — Other Ambulatory Visit: Payer: Self-pay | Admitting: Emergency Medicine

## 2023-05-15 ENCOUNTER — Other Ambulatory Visit: Payer: Self-pay

## 2023-05-19 ENCOUNTER — Telehealth: Payer: Self-pay | Admitting: Gastroenterology

## 2023-05-19 ENCOUNTER — Telehealth: Payer: Self-pay | Admitting: *Deleted

## 2023-05-19 MED ORDER — AZATHIOPRINE 50 MG PO TABS: 50.0000 mg | ORAL_TABLET | Freq: Every day | ORAL | 1 refills | Status: DC

## 2023-05-19 MED ORDER — ESOMEPRAZOLE MAGNESIUM 40 MG PO CPDR: 40.0000 mg | DELAYED_RELEASE_CAPSULE | Freq: Every day | ORAL | 1 refills | Status: DC

## 2023-05-19 MED ORDER — SUCRALFATE 1 G PO TABS: 1.0000 g | ORAL_TABLET | Freq: Two times a day (BID) | ORAL | 0 refills | Status: DC

## 2023-05-19 NOTE — Telephone Encounter (Signed)
 Called and made patient aware with interpreter present. Per Dr. Alda Amas Coronary CTA results were normal heart arteries. Patient will be leaving for couple months and verbalizes she would like to schedule her 4 month follow up around July. Patient made aware that four month follow up would make her appt around March. Patient verbalizes she understandings but will schedule after her visit with her sister.

## 2023-05-19 NOTE — Telephone Encounter (Signed)
 Returned call to patient. Informed her that Dr Brice Campi only prescribes 3 medications for her. All other medication refills need to be sent to her PCP. Patient voiced understanding. Refills for Nexium , Carafate  and Azathioprine  have been sent to mail order pharmacy.

## 2023-05-19 NOTE — Telephone Encounter (Signed)
 PT is requesting a call back to discuss her prescriptions. Please advise.

## 2023-05-20 ENCOUNTER — Ambulatory Visit: Payer: Medicaid Other | Admitting: Physical Therapy

## 2023-05-21 ENCOUNTER — Other Ambulatory Visit: Payer: Self-pay | Admitting: Gastroenterology

## 2023-05-27 ENCOUNTER — Encounter: Payer: Self-pay | Admitting: Radiology

## 2023-06-03 ENCOUNTER — Other Ambulatory Visit: Payer: Self-pay | Admitting: Gastroenterology

## 2023-06-04 NOTE — Telephone Encounter (Signed)
 Okay to send 90 day supply of carafate? With refills?

## 2023-06-13 ENCOUNTER — Encounter: Payer: Self-pay | Admitting: Radiology

## 2023-06-13 ENCOUNTER — Ambulatory Visit: Payer: Medicaid Other | Admitting: Radiology

## 2023-06-13 ENCOUNTER — Other Ambulatory Visit (HOSPITAL_COMMUNITY)
Admission: RE | Admit: 2023-06-13 | Discharge: 2023-06-13 | Disposition: A | Discharge status: Home / Self Care | Source: Ambulatory Visit | Attending: Radiology | Admitting: Radiology

## 2023-06-13 VITALS — BP 116/70 | HR 72 | Ht 64.5 in | Wt 170.2 lb

## 2023-06-13 DIAGNOSIS — M810 Age-related osteoporosis without current pathological fracture: Secondary | ICD-10-CM | POA: Diagnosis not present

## 2023-06-13 DIAGNOSIS — Z01419 Encounter for gynecological examination (general) (routine) without abnormal findings: Secondary | ICD-10-CM | POA: Insufficient documentation

## 2023-06-13 DIAGNOSIS — N958 Other specified menopausal and perimenopausal disorders: Secondary | ICD-10-CM | POA: Diagnosis not present

## 2023-06-13 DIAGNOSIS — N898 Other specified noninflammatory disorders of vagina: Secondary | ICD-10-CM | POA: Diagnosis not present

## 2023-06-13 LAB — WET PREP FOR TRICH, YEAST, CLUE

## 2023-06-13 MED ORDER — PREMARIN 0.625 MG/GM VA CREA: 1.0000 g | TOPICAL_CREAM | VAGINAL | 2 refills | Status: DC

## 2023-06-13 NOTE — Progress Notes (Signed)
 Lindsey Massey [birth year] [id number]   History: Postmenopausal 63 y.o. presents for annual exam with Arabic interpreter. C/o vaginal itching. Used monistat 2 weeks ago with little relief. No other gyn concerns.   Gynecologic History Postmenopausal Last Pap: unsure Last mammogram: 10/24. Results were: normal Last colonoscopy: 04/2023 DEXA:04/2023 osteoporosis  Obstetric History OB History  Gravida Para Term Preterm AB Living  1    1 0  SAB IAB Ectopic Multiple Live Births  1        # Outcome Date GA Lbr Len/2nd Weight Sex Type Anes PTL Lv  1 SAB                04/30/2023    1:16 PM 03/11/2023    1:34 PM  Depression screen PHQ 2/9  Decreased Interest 0 2  Down, Depressed, Hopeless 0 2  PHQ - 2 Score 0 4  Altered sleeping  2  Tired, decreased energy  2  Change in appetite  2  Feeling bad or failure about yourself   2  Trouble concentrating  2  Moving slowly or fidgety/restless  2  Suicidal thoughts  1  PHQ-9 Score  17  Difficult doing work/chores  Somewhat difficult     The following portions of the patient's history were reviewed and updated as appropriate: allergies, current medications, past family history, past medical history, past social history, past surgical history, and problem list.  Review of Systems Pertinent items noted in HPI and remainder of comprehensive ROS otherwise negative.  Past medical history, past surgical history, family history and social history were all reviewed and documented in the EPIC chart.  Exam:  Vitals:   06/13/23 1355  BP: 116/70  Pulse: 72  SpO2: 96%  Weight: 170 lb 3.2 oz (77.2 kg)  Height: 5' 4.5" (1.638 m)   Body mass index is 28.76 kg/m.  General appearance:  Normal Thyroid:  Symmetrical, normal in size, without palpable masses or nodularity. Respiratory  Auscultation:  Clear without wheezing or rhonchi Cardiovascular  Auscultation:  Regular rate, without rubs, murmurs or gallops  Edema/varicosities:  Not grossly  evident Abdominal  Soft,nontender, without masses, guarding or rebound.  Liver/spleen:  No organomegaly noted  Hernia:  None appreciated  Skin  Inspection:  Grossly normal Breasts: Examined lying and sitting.   Right: Without masses, retractions, nipple discharge or axillary adenopathy.   Left: Without masses, retractions, nipple discharge or axillary adenopathy. Genitourinary   Inguinal/mons:  Normal without inguinal adenopathy  External genitalia:  Normal appearing vulva with no masses, tenderness, or lesions  BUS/Urethra/Skene's glands:  Normal  Vagina:  Normal appearing with normal color and discharge, no lesions. Atrophy: mild   Cervix:  Normal appearing without discharge or lesions  Uterus:  Normal in size, shape and contour.  Midline and mobile, nontender  Adnexa/parametria:     Rt: Normal in size, without masses or tenderness.   Lt: Normal in size, without masses or tenderness.  Anus and perineum: Normal    Raynelle Fanning, CMA present for exam  Microscopic wet-mount exam shows negative for pathogens, normal epithelial cells.   Assessment/Plan:   1. Well woman exam with routine gynecological exam (Primary) - Cytology - PAP( [place])  2. Vagina itching R/t dryness, neg wet prep - WET PREP FOR TRICH, YEAST, CLUE  3. Osteoporosis, unspecified osteoporosis type, unspecified pathological fracture presence Managed by PCP on Boniva   4. Genitourinary syndrome of menopause - conjugated estrogens (PREMARIN) vaginal cream; Place 0.5 Applicatorfuls vaginally 3 (three) times  a week.  Dispense: 42.5 g; Refill: 2   Discussed SBE, colonoscopy and DEXA screening as directed. Recommend of exercise weekly, including weight bearing exercise. Encouraged the use of seatbelts and sunscreen.  Return in 1 year for annual or sooner prn.  Arlie Solomons B WHNP-BC, 2:19 PM 06/13/2023

## 2023-06-16 ENCOUNTER — Ambulatory Visit (INDEPENDENT_AMBULATORY_CARE_PROVIDER_SITE_OTHER): Admitting: Emergency Medicine

## 2023-06-16 ENCOUNTER — Encounter: Payer: Self-pay | Admitting: Emergency Medicine

## 2023-06-16 ENCOUNTER — Other Ambulatory Visit: Payer: Self-pay | Admitting: Emergency Medicine

## 2023-06-16 VITALS — BP 102/60 | HR 87 | Temp 97.9°F | Ht 64.5 in | Wt 172.4 lb

## 2023-06-16 DIAGNOSIS — K5080 Crohn's disease of both small and large intestine without complications: Secondary | ICD-10-CM | POA: Diagnosis not present

## 2023-06-16 DIAGNOSIS — M810 Age-related osteoporosis without current pathological fracture: Secondary | ICD-10-CM

## 2023-06-16 DIAGNOSIS — D84821 Immunodeficiency due to drugs: Secondary | ICD-10-CM

## 2023-06-16 DIAGNOSIS — I10 Essential (primary) hypertension: Secondary | ICD-10-CM | POA: Diagnosis not present

## 2023-06-16 DIAGNOSIS — E785 Hyperlipidemia, unspecified: Secondary | ICD-10-CM | POA: Diagnosis not present

## 2023-06-16 DIAGNOSIS — E7800 Pure hypercholesterolemia, unspecified: Secondary | ICD-10-CM

## 2023-06-16 DIAGNOSIS — Z79899 Other long term (current) drug therapy: Secondary | ICD-10-CM

## 2023-06-16 DIAGNOSIS — L089 Local infection of the skin and subcutaneous tissue, unspecified: Secondary | ICD-10-CM | POA: Insufficient documentation

## 2023-06-16 DIAGNOSIS — F418 Other specified anxiety disorders: Secondary | ICD-10-CM

## 2023-06-16 MED ORDER — IBANDRONATE SODIUM 150 MG PO TABS: 150.0000 mg | ORAL_TABLET | ORAL | 0 refills | Status: DC

## 2023-06-16 MED ORDER — DENOSUMAB 60 MG/ML ~~LOC~~ SOSY: 60.0000 mg | PREFILLED_SYRINGE | Freq: Once | SUBCUTANEOUS | Status: AC

## 2023-06-16 MED ORDER — MUPIROCIN 2 % EX OINT: 1.0000 | TOPICAL_OINTMENT | Freq: Two times a day (BID) | CUTANEOUS | 1 refills | Status: AC

## 2023-06-16 MED ORDER — MIRTAZAPINE 15 MG PO TABS: 15.0000 mg | ORAL_TABLET | Freq: Every day | ORAL | 1 refills | Status: AC

## 2023-06-16 MED ORDER — CANDESARTAN CILEXETIL 8 MG PO TABS
8.0000 mg | ORAL_TABLET | Freq: Every day | ORAL | 3 refills | Status: DC
Start: 2023-06-16 — End: 2023-07-08

## 2023-06-16 MED ORDER — SIMVASTATIN 20 MG PO TABS: 20.0000 mg | ORAL_TABLET | Freq: Every day | ORAL | 1 refills | Status: DC

## 2023-06-16 MED ORDER — ALPRAZOLAM 0.5 MG PO TABS: 0.5000 mg | ORAL_TABLET | Freq: Every day | ORAL | 1 refills | Status: DC | PRN

## 2023-06-16 MED ORDER — PREDNISOLONE ACETATE 1 % OP SUSP
1.0000 [drp] | Freq: Four times a day (QID) | OPHTHALMIC | 1 refills | Status: AC
Start: 2023-06-16 — End: ?

## 2023-06-16 NOTE — Assessment & Plan Note (Signed)
 With a skin infection of left big toe Recommend topical Bactroban twice a day for 7 days We will try to avoid oral antibiotics due to Crohn's disease

## 2023-06-16 NOTE — Progress Notes (Signed)
 Lindsey Massey 63 y.o.   Chief Complaint  Patient presents with   Medical Management of Chronic Issues    Dexa results     HISTORY OF PRESENT ILLNESS: This is a 63 y.o. female here for follow-up of chronic medical conditions and also follow-up of DEXA scan which shows osteoporosis Interpreter helping with Arabic language No other complaints or medical concerns today Overall doing well. States she will be traveling to [place] next week to sisters home and will not be back for 4 to 5 months.  HPI   Prior to Admission medications   Medication Sig Start Date End Date Taking? Authorizing Provider  aspirin EC 81 MG tablet Take 1 tablet (81 mg total) by mouth daily. Swallow whole. 01/24/23   Mansouraty, Netty Starring., MD  azaTHIOprine (IMURAN) 50 MG tablet Take 1 tablet (50 mg total) by mouth daily. 05/19/23   Mansouraty, Netty Starring., MD  calcium carbonate (CALCIUM ANTACID) 500 MG chewable tablet Chew 1 tablet (200 mg of elemental calcium total) by mouth daily. Patient not taking: Reported on 06/13/2023 01/24/23   Mansouraty, Netty Starring., MD  candesartan (ATACAND) 8 MG tablet Take 1 tablet (8 mg total) by mouth daily. 04/21/23   Georgina Quint, MD  Cholecalciferol (VITAMIN D3) 125 MCG (5000 UT) CAPS Take 1 capsule (5,000 Units total) by mouth daily. 01/24/23   Mansouraty, Netty Starring., MD  conjugated estrogens (PREMARIN) vaginal cream Place 0.5 Applicatorfuls vaginally 3 (three) times a week. 06/13/23   Chrzanowski, Jami B, NP  Cyanocobalamin (VITAMIN B 12 PO) Take by mouth.    [provider]  esomeprazole (NEXIUM) 40 MG capsule Take 1 capsule (40 mg total) by mouth daily. 05/19/23   Mansouraty, Netty Starring., MD  ibandronate (BONIVA) 150 MG tablet Take 1 tablet (150 mg total) by mouth every 30 (thirty) days. Take in the morning with a full glass of water, on an empty stomach, and do not take anything else by mouth or lie down for the next 30 min. 04/21/23   Georgina Quint, MD   Magnesium 250 MG CAPS Take 250 mg by mouth daily. Takes 500 04/30/23   Georgina Quint, MD  methocarbamol (ROBAXIN-750) 750 MG tablet Take 1 tablet (750 mg total) by mouth 2 (two) times daily as needed for muscle spasms. Patient not taking: Reported on 06/13/2023 05/06/23   Cristie Hem, PA-C  mirtazapine (REMERON) 15 MG tablet TAKE 1 TABLET BY MOUTH EVERYDAY AT BEDTIME 05/13/23   Georgina Quint, MD  prednisoLONE acetate (PRED FORTE) 1 % ophthalmic suspension Place 1 drop into the left eye 4 (four) times daily. 06/05/23   [provider]  predniSONE (STERAPRED UNI-PAK 21 TAB) 10 MG (21) TBPK tablet Take as directed Patient not taking: Reported on 06/13/2023 05/06/23   Cristie Hem, PA-C  sertraline (ZOLOFT) 50 MG tablet Take 1 tablet (50 mg total) by mouth daily. 01/24/23   Mansouraty, Netty Starring., MD  simvastatin (ZOCOR) 20 MG tablet Take 1 tablet (20 mg total) by mouth daily. 05/11/23   Georgina Quint, MD  sucralfate (CARAFATE) 1 g tablet TAKE 1 TABLET BY MOUTH TWICE  DAILY 06/04/23   Mansouraty, Netty Starring., MD  traMADol (ULTRAM) 50 MG tablet Take 1 tablet (50 mg total) by mouth 2 (two) times daily as needed. 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) 01/08/2023   Immunosuppression due to drug therapy (HCC) 01/08/2023   Dyslipidemia 09/16/2011   Osteoporosis 09/16/2011   GERD without esophagitis 09/16/2011   Uterine fibroid 06/26/2011   Depression 06/26/2011   Hypercholesterolemia 06/26/2011    Past Medical History:  Diagnosis Date   Asthma    Cataract    Crohn disease (HCC)    Depressed    GERD (gastroesophageal reflux disease)     High cholesterol    Hx of tonsillectomy    Hypercholesteremia    Hypertension    Osteoporosis    Shortness of breath    sometimes before sleep   Uterine fibroid     Past Surgical History:  Procedure Laterality Date   DILATION AND CURRETTAGE     LASIK     [place] and then lasik done in [place]   TONSILLECTOMY     age 72   TONSILLECTOMY AND ADENOIDECTOMY      Social History   Socioeconomic History   Marital status: Divorced    Spouse name: Not on file   Number of children: Not on file   Years of education: Not on file   Highest education level: 12th grade  Occupational History   Not on file  Tobacco Use   Smoking status: Every Day    Current packs/day: 5.00    Average packs/day: 5.0 packs/day for 11.2 years (55.9 ttl pk-yrs)    Types: Cigarettes    Start date: 2014   Smokeless tobacco: Not on file  Vaping Use   Vaping status: Never Used  Substance and Sexual Activity   Alcohol use: No   Drug use: No   Sexual activity: Not Currently    Partners: Male    Birth control/protection: Post-menopausal    Comment: menarche 63yo, sexual debut 63yo  Other Topics Concern   Not on file  Social History Narrative   Lives with sister and nephews (18,16) in [place]. Moved from [place] in April 2012. Separated from husband. No children. Does not work.      Balanced diet. Little exercise.      Christian faith.   Social Drivers of Health   Financial Resource Strain: High Risk (04/30/2023)   Overall Financial Resource Strain (CARDIA)    Difficulty of Paying Living Expenses: Very hard  Food Insecurity: Food Insecurity Present (04/30/2023)   Hunger Vital Sign    Worried About Running Out of Food in the Last Year: Often true    Ran Out of Food in the Last Year: Sometimes true  Transportation Needs: Unmet Transportation Needs (04/30/2023)   PRAPARE - Transportation    Lack of Transportation (Medical): Yes    Lack of Transportation (Non-Medical): Yes   Physical Activity: Insufficiently Active (04/30/2023)   Exercise Vital Sign    Days of Exercise per Week: 2 days    Minutes of Exercise per Session: 10 min  Stress: Stress Concern Present (04/30/2023)   [employer] of Occupational Health - Occupational Stress Questionnaire    Feeling of Stress : Very much  Social Connections: Unknown (04/30/2023)   Social Connection and Isolation Panel [NHANES]    Frequency of Communication with Friends and Family: Twice a week    Frequency of Social Gatherings with Friends and Family: Patient declined    Attends Religious Services: Never    Active Member  of Clubs or Organizations: No    Attends Engineer, structural: Not on file    Marital Status: Living with partner  Recent Concern: Social Connections - Moderately Isolated (03/10/2023)   Social Connection and Isolation Panel [NHANES]    Frequency of Communication with Friends and Family: More than three times a week    Frequency of Social Gatherings with Friends and Family: More than three times a week    Attends Religious Services: More than 4 times per year    Active Member of [employer] or Organizations: No    Attends Engineer, structural: Not on file    Marital Status: Divorced  Catering manager Violence: Not on file    Family History  Problem Relation Age of Onset   Arthritis Mother    Hypertension Mother    Heart disease Mother    Cancer Father        liver   Hypertension Father    Arthritis Sister    Hypertension Brother    Multiple sclerosis Brother    Heart disease Paternal Uncle    Anesthesia problems Neg Hx    Colon cancer Neg Hx    Esophageal cancer Neg Hx    Inflammatory bowel disease Neg Hx    Liver disease Neg Hx    Pancreatic cancer Neg Hx    Rectal cancer Neg Hx    Stomach cancer Neg Hx    Colon polyps Neg Hx      Review of Systems  Constitutional: Negative.  Negative for chills and fever.  HENT: Negative.  Negative for congestion and sore throat.    Respiratory: Negative.  Negative for cough and shortness of breath.   Cardiovascular: Negative.  Negative for chest pain and palpitations.  Gastrointestinal:  Negative for abdominal pain, diarrhea, nausea and vomiting.  Genitourinary: Negative.  Negative for dysuria and hematuria.  Skin: Negative.  Negative for rash.  Neurological: Negative.  Negative for dizziness and headaches.  All other systems reviewed and are negative.   Vitals:   06/16/23 1301  Pulse: 87  Temp: 97.9 F (36.6 C)  SpO2: 97%    Physical Exam Vitals reviewed.  Constitutional:      Appearance: Normal appearance.  HENT:     Head: Normocephalic.  Eyes:     Extraocular Movements: Extraocular movements intact.  Cardiovascular:     Rate and Rhythm: Normal rate.  Pulmonary:     Effort: Pulmonary effort is normal.  Skin:    General: Skin is warm and dry.     Capillary Refill: Capillary refill takes less than 2 seconds.     Comments: Left big toe: Dorsal aspect shows abrasion with surrounding erythema and swelling compatible with infection  Neurological:     General: No focal deficit present.     Mental Status: She is alert and oriented to person, place, and time.  Psychiatric:        Mood and Affect: Mood normal.        Behavior: Behavior normal.      ASSESSMENT & PLAN: A total of 46 minutes was spent with the patient and counseling/coordination of care regarding preparing for this visit, review of most recent office visit notes, review of multiple chronic medical conditions and their management, diagnosis of osteoporosis and treatment options, review of all medications, review of most recent bloodwork results, diagnosis of skin infection and need for topical antibiotics, review of health maintenance items, education on nutrition, prognosis, documentation, and need for follow up.  Problem List Items Addressed This Visit       Cardiovascular and Mediastinum   Essential hypertension - Primary    Well-controlled hypertension Continue candesartan 8 mg daily Cardiovascular risks associated with hypertension discussed Diet and nutrition discussed      Relevant Medications   simvastatin (ZOCOR) 20 MG tablet   candesartan (ATACAND) 8 MG tablet     Digestive   Crohn's disease of both small and large intestine without complication (HCC)   Recently seen by GI doctor Recent upper and lower endoscopy reports reviewed Crohn's medications handled by GI's office        Musculoskeletal and Integument   Osteoporosis   DEXA scan report from 05/06/2023 reviewed with patient Shows osteoporosis Recommend Prolia injections every 6 months Presently taking Boniva 150 mg monthly       Relevant Medications   ibandronate (BONIVA) 150 MG tablet   denosumab (PROLIA) injection 60 mg (Start on 07/17/2023 12:00 AM)   Skin infection   Superficial skin infection of left big toe dorsal aspect Recommend topical treatment with Bactroban twice a day for 7 days      Relevant Medications   mupirocin ointment (BACTROBAN) 2 %     Other   Hypercholesterolemia   Relevant Medications   simvastatin (ZOCOR) 20 MG tablet   candesartan (ATACAND) 8 MG tablet   Dyslipidemia   Stable chronic condition Diet and nutrition discussed Continue simvastatin 10 mg daily      Relevant Medications   simvastatin (ZOCOR) 20 MG tablet   Immunosuppression due to drug therapy (HCC)   With a skin infection of left big toe Recommend topical Bactroban twice a day for 7 days We will try to avoid oral antibiotics due to Crohn's disease      Other Visit Diagnoses       Situational anxiety       Relevant Medications   mirtazapine (REMERON) 15 MG tablet   ALPRAZolam (XANAX) 0.5 MG tablet      Patient Instructions  ?????? ??????? ????? ????? ?????? Eating Plan for Osteoporosis ????? ??????? ?????? ????? ????? ??????? ??? ???? ??? ?????? ????????. ???? ??? ?? ???????? ????? ?????? ???? ??????? ??????? ?? ??????  ??????. ????????? ??? ????? ??????? ?? ????? ??????? ?????? ????? ?????? ???? ?? ????? ??? ????? ????? ?????? ???? ??????. ????????? ???????? ? ?? ??????? ???????? ???? ??? ??? ??? ?? ??? ??????. ???????? ? ????? ???? ??? ??????? ????????? ?????? ??????. ??????? ??? ?? ???? ?? ????????? ???????? ? ??? ??????? ?????? ?? ?????? ??????? ????? ????? ??????. ?? ??????? ??????? ?????? ??? ?????? ????? ?????? ????????? ????????  ???? ?????? ??? 1000 ???????? (???) ?? ????????? ??? ????? ??????.  ???? ?? ??????? ???? ????? ??? 50 ??? ??? ????? ?? ????????? ??? ??? ?????.  ????? ???? ??????? ?????? ??????? ?? ??? ????? ?????? ????????? ??? ??? ?? ???? ??? ?? ???? ?? ????????? ?? ??????.  ?? ?????? ???? ?? 2500 ??? ?? ????????? ??????. ???? ?? ???? ?????? ??? ?? ???? ???? ?? ????????? ?? ?????? ??? ???????? ???????? ????. ???????? ?? ????? ??????? ??? ???? ??? ???? ?? ???? ?? ??????? ????? ???? ?? ?????? ????? ?????? ???? ????.  ???? ??????? ???? ????? ??? ??????? ?.  ????? ?????? ??????? ?????? ????? ??? [phone] ???? ????? ?? ??????? ?. ??? ????? ??? ??????? ???????? ??????? ????? ???? ????? ????? ???????. ????? ??????? ??????? ?? ???? ??????? ?????? ??????? ?? ????? ???? ??????? ? ???????? ??.  ???? ??????? ???? ????? ??? ???? ?? 300 ???? ?? ???????? ?? ??? ??????? ???????. ?????? ???? ???????? ???? ???? ????? ?? ???? ???? ???? ?????????.  ????? ???? ??????? ?????? ?? ??????? ??????? ??????? ?? ??? ????? ???????? ??????? ?? ?? ??????. ??????  ?? ????? ??????? ?????? ????? ?????? ??? ?? ???: ? ??????? ??????? ???????. ? ????. ? ?????? ????????. ? ?????? ???????. ? ?????? ??????? ??? ??? ??????? ??????? ?? ????? ??????? ?? ??????? ??? ??????? ?? ????? ?????. ? ??????? ???????. ????? ???????  ????? ????? ??????? ????? ?? ????? ????? ??? ????????? ?????? ????????? ?????? ???? ?????????? ???????? ?.  ?????  5 ??? ??? ????? ?? ??????? ?????????? ??????? ?? ???.  ????? 5-6 ?????? ([phone] ??) ?? ?????? ?????  ?????? ?? ??????? ?? ??????? ?? ????? ?? ????????? ??????. ??? ??????  ?? ?????? ?????? ????? ??? ????????? ?? ????? ??? ??????? ???????? ??????????? ???? ?????. ????? ???? ??????? ?????? ??? ????? ??? ???????? ??????? ?? ???????.  ??? ????? ??????? ??????? ?????? ?????? ????? ????? ????????? ???????: ? ????? ???????? ????? ???? ???? ????? ?????? ??? ?????? ??? ????? ???????. ? ???? ???????? ??? ????? ???? 30 ????? ?????? ?? 5 ???? ?? ???? ?? ?????? ?? ??? ??????? ???? ??????? ??????.  ????? ???????? ???? ????? ???? ??? ?????? ????? ?????? ??????? ???????? ??????. ?? ???? ??? ??? ???????? ???????? ?? ?????????.  ?? ?????? ????????? ??????? ?? ??????? ???????: ? ??? ????? ???? ??????? ?????? ??????? ?????? ???? ????? ?????????. ? ??? ???? ??????? ?? ?????? ?????? ?? ??????? ????? ?? ?????? ??????.  ??? ??? ?????? ????????? ????????: ? ??? ???? ?????? ????????? ??? ????? ??????: ? ????? ???? ??? ???? ??????. ? ??????? ??? ???? ??????. ? ????? ??? ???? ?????? ?? ??????. ?? ???????? ???????? ????? ??????? ?????? ???? ??? 12 ????? (355 ??) ?? ?????? ?? ??? ??? 5 ?????? (148 ??) ?? ?????? ?? ??? ??? ????? ???? (44 ??) ?? ????????? ???????? ??????. ?? ??????? ???? ??? ?? ????????? ??????? ?????? ??????????   ?????. ????? ?????? ????????.  ??????. ?????? ????? ????? ??????. ????? ?????? ?????.   ???? ???????? ?????? ???? ?????????.  ??? ??????????. ??? ???????? ????? ????? ??????. ????? ????? ???????. ????? ?????. ????? ??????.  ??????? ??????. ??????? ??????.  ?????? ??????? ??????????. ??? ???? ??????? ?????? ????? ?????????. ???? ??????? ?????? ????? ?????????. ?????? ?????? ?????? ???? ?????????.  ????? ??????? ???????. ????? ????? ????????. ????????. ??????.  ?????.  ????????? ???????.  ??????? ?????. ??????? ?????? ???????? ?  ??? ??? ?????. ??????? ???????? ??? ??? ?????? ????????? ????????.  ??????. ???? ?????? ?????? ???? ??????? ?. ???? ??????? ?????? ???? ??????? ?.  ?????.  ?????.  ???? ?????. ??????? ?????? ?????????  ????? ??????. ???? ?????. ??? ??? ???????.  ???? ??????.  ?????? (???????). ????? ?????.  ??????.   ??? ?????? ??????. ????? ??????. ????? ?????????? (??????? ??????? ?? ???????).  ????? ??????.  ?????.  ???? ????? ???????? (Peanut butter).  ???? ???????. ????????. ???? ???? ?????.  ????? ?????.  ?????? ?? ?????? ?????? ??????? ??? ???? ??????. ?? ?? ???? ??????? ??????? ????? ??????? ??????? ?? ????????? ?????????? ???? ????? ???????. ????? ??????? ????? ?????? ?????? ?? ????????. ????  ????????? ???????? ? ?????? ??????? ???? ??? ??? ?? ??? ?????? ???? ??? ??? ?? ?????? ??????? ????? ????? ??????.  ????? ????? ??????? ????? ?? ????? ????? ??? ????????? ?????? ????????? ?????? ???? ?????????? ???????? ?.  ???? ??????? ???? ????? ??? ???? ?? 300 ???? ?? ???????? ?? ??? ??????? ???????. ?????? ???? ???????? ???? ???? ????? ?? ???? ???? ???? ?????????.  ?????? ???????? ???? ??????? ?? ????? ?????? ??????. ???? ?????? ??????? 30 ????? ??? ????? ?????? ???? 5 ???? ?? ???????. ??? ????? ?? ??? ????????? ?? ???? ?????? ????????? ???? ?????? ???? ??????? ??????. ???? ?? ?????? ??? ????? ???? ?? ???? ?? ???? ??????? ??????.? Document Revised: 10/28/2019 Document Reviewed: 10/28/2019 Elsevier Patient Education  2024 [employer]     Edwina Barth, MD [facility]

## 2023-06-16 NOTE — Telephone Encounter (Signed)
 Call pharmacy and find out what alternatives are covered by her insurance.  Thanks.

## 2023-06-16 NOTE — Assessment & Plan Note (Signed)
 Well-controlled hypertension Continue candesartan 8 mg daily Cardiovascular risks associated with hypertension discussed Diet and nutrition discussed

## 2023-06-16 NOTE — Assessment & Plan Note (Signed)
 Recently seen by GI doctor Recent upper and lower endoscopy reports reviewed Crohn's medications handled by [facility] office

## 2023-06-16 NOTE — Assessment & Plan Note (Signed)
 Stable chronic condition Diet and nutrition discussed Continue simvastatin 10 mg daily

## 2023-06-16 NOTE — Patient Instructions (Signed)
 ?????? ??????? ????? ????? ?????? Eating Plan for Osteoporosis ????? ??????? ?????? ????? ????? ??????? ??? ???? ??? ?????? ????????. ???? ??? ?? ???????? ????? ?????? ???? ??????? ??????? ?? ?????? ??????. ????????? ??? ????? ??????? ?? ????? ??????? ?????? ????? ?????? ???? ?? ????? ??? ????? ????? ?????? ???? ??????. ????????? ???????? ? ?? ??????? ???????? ???? ??? ??? ??? ?? ??? ??????. ???????? ? ????? ???? ??? ??????? ????????? ?????? ??????. ??????? ??? ?? ???? ?? ????????? ???????? ? ??? ??????? ?????? ?? ?????? ??????? ????? ????? ??????. ?? ??????? ??????? ?????? ??? ?????? ????? ?????? ????????? ????????  ???? ?????? ??? 1000 ???????? (???) ?? ????????? ??? ????? ??????.  ???? ?? ??????? ???? ????? ??? 50 ??? ??? ????? ?? ????????? ??? ??? ?????.  ????? ???? ??????? ?????? ??????? ?? ??? ????? ?????? ????????? ??? ??? ?? ???? ??? ?? ???? ?? ????????? ?? ??????.  ?? ?????? ???? ?? 2500 ??? ?? ????????? ??????. ???? ?? ???? ?????? ??? ?? ???? ???? ?? ????????? ?? ?????? ??? ???????? ???????? ????. ???????? ?? ????? ??????? ??? ???? ??? ???? ?? ???? ?? ??????? ????? ???? ?? ?????? ????? ?????? ???? ????.  ???? ??????? ???? ????? ??? ??????? ?.  ????? ?????? ??????? ?????? ????? ??? [phone] ???? ????? ?? ??????? ?. ??? ????? ??? ??????? ???????? ??????? ????? ???? ????? ????? ???????. ????? ??????? ??????? ?? ???? ??????? ?????? ??????? ?? ????? ???? ??????? ? ???????? ??.  ???? ??????? ???? ????? ??? ???? ?? 300 ???? ?? ???????? ?? ??? ??????? ???????. ?????? ???? ???????? ???? ???? ????? ?? ???? ???? ???? ?????????.  ????? ???? ??????? ?????? ?? ??????? ??????? ??????? ?? ??? ????? ???????? ??????? ?? ?? ??????. ??????  ?? ????? ??????? ?????? ????? ?????? ??? ?? ???: ? ??????? ??????? ???????. ? ????. ? ?????? ????????. ? ?????? ???????. ? ?????? ??????? ??? ??? ??????? ??????? ?? ????? ??????? ?? ??????? ??? ??????? ?? ????? ?????. ? ??????? ???????. ????? ???????  ????? ?????  ??????? ????? ?? ????? ????? ??? ????????? ?????? ????????? ?????? ???? ?????????? ???????? ?.  ????? 5 ??? ??? ????? ?? ??????? ?????????? ??????? ?? ???.  ????? 5-6 ?????? ([phone] ??) ?? ?????? ????? ?????? ?? ??????? ?? ??????? ?? ????? ?? ????????? ??????. ??? ??????  ?? ?????? ?????? ????? ??? ????????? ?? ????? ??? ??????? ???????? ??????????? ???? ?????. ????? ???? ??????? ?????? ??? ????? ??? ???????? ??????? ?? ???????.  ??? ????? ??????? ??????? ?????? ?????? ????? ????? ????????? ???????: ? ????? ???????? ????? ???? ???? ????? ?????? ??? ?????? ??? ????? ???????. ? ???? ???????? ??? ????? ???? 30 ????? ?????? ?? 5 ???? ?? ???? ?? ?????? ?? ??? ??????? ???? ??????? ??????.  ????? ???????? ???? ????? ???? ??? ?????? ????? ?????? ??????? ???????? ??????. ?? ???? ??? ??? ???????? ???????? ?? ?????????.  ?? ?????? ????????? ??????? ?? ??????? ???????: ? ??? ????? ???? ??????? ?????? ??????? ?????? ???? ????? ?????????. ? ??? ???? ??????? ?? ?????? ?????? ?? ??????? ????? ?? ?????? ??????.  ??? ??? ?????? ????????? ????????: ? ??? ???? ?????? ????????? ??? ????? ??????: ? ????? ???? ??? ???? ??????. ? ??????? ??? ???? ??????. ? ????? ??? ???? ?????? ?? ??????. ?? ???????? ???????? ????? ??????? ?????? ???? ??? 12 ????? (355 ??) ?? ?????? ?? ??? ??? 5 ?????? (148 ??) ?? ?????? ?? ??? ??? ????? ???? (44 ??) ?? ????????? ???????? ??????. ?? ??????? ???? ??? ?? ????????? ??????? ?????? ??????????   ?????. ????? ?????? ????????.  ??????. ?????? ????? ????? ??????. ????? ?????? ?????.   ???? ???????? ?????? ???? ?????????.  ??? ??????????. ??? ???????? ????? ????? ??????. ????? ????? ???????. ????? ?????. ????? ??????.  ??????? ??????. ??????? ??????.  ?????? ??????? ??????????. ??? ???? ??????? ?????? ????? ?????????. ???? ??????? ?????? ????? ?????????. ?????? ?????? ?????? ???? ?????????.  ????? ??????? ???????. ????? ????? ????????. ????????. ??????.  ?????.  ?????????  ???????.  ??????? ?????. ??????? ?????? ???????? ?  ??? ??? ?????. ??????? ???????? ??? ??? ?????? ????????? ????????.  ??????. ???? ?????? ?????? ???? ??????? ?. ???? ??????? ?????? ???? ??????? ?.  ?????. ?????.  ???? ?????. ??????? ?????? ?????????  ????? ??????. ???? ?????. ??? ??? ???????.  ???? ??????.  ?????? (???????). ????? ?????.  ??????.   ??? ?????? ??????. ????? ??????. ????? ?????????? (??????? ??????? ?? ???????).  ????? ??????.  ?????.  ???? ????? ???????? (  Peanut butter).  ???? ???????. ????????. ???? ???? ?????.  ????? ?????.  ?????? ?? ?????? ?????? ??????? ??? ???? ??????. ?? ?? ???? ??????? ??????? ????? ??????? ??????? ?? ????????? ?????????? ???? ????? ???????. ????? ??????? ????? ?????? ?????? ?? ????????. ????  ????????? ???????? ? ?????? ??????? ???? ??? ??? ?? ??? ?????? ???? ??? ??? ?? ?????? ??????? ????? ????? ??????.  ????? ????? ??????? ????? ?? ????? ????? ??? ????????? ?????? ????????? ?????? ???? ?????????? ???????? ?.  ???? ??????? ???? ????? ??? ???? ?? 300 ???? ?? ???????? ?? ??? ??????? ???????. ?????? ???? ???????? ???? ???? ????? ?? ???? ???? ???? ?????????.  ?????? ???????? ???? ??????? ?? ????? ?????? ??????. ???? ?????? ??????? 30 ????? ??? ????? ?????? ???? 5 ???? ?? ???????. ??? ????? ?? ??? ????????? ?? ???? ?????? ????????? ???? ?????? ???? ??????? ??????. ???? ?? ?????? ??? ????? ???? ?? ???? ?? ???? ??????? ??????.? Document Revised: 10/28/2019 Document Reviewed: 10/28/2019 Elsevier Patient Education  2024 [employer].

## 2023-06-16 NOTE — Assessment & Plan Note (Signed)
 Superficial skin infection of left big toe dorsal aspect Recommend topical treatment with Bactroban twice a day for 7 days

## 2023-06-16 NOTE — Assessment & Plan Note (Signed)
 DEXA scan report from 05/06/2023 reviewed with patient Shows osteoporosis Recommend Prolia injections every 6 months Presently taking Boniva 150 mg monthly

## 2023-06-18 LAB — CYTOLOGY - PAP
Comment: NEGATIVE
Diagnosis: NEGATIVE
High risk HPV: NEGATIVE

## 2023-06-20 ENCOUNTER — Telehealth: Payer: Self-pay

## 2023-06-20 ENCOUNTER — Other Ambulatory Visit (HOSPITAL_COMMUNITY): Payer: Self-pay

## 2023-06-20 NOTE — Telephone Encounter (Signed)
 Pharmacy Patient Advocate Encounter   Received notification from RX Request Messages that prior authorization for Ibandronate 150mg  is required/requested.   Insurance verification completed.   The patient is insured through [facility] MEDICAID .   Per test claim: PA required; PA submitted to above mentioned insurance via CoverMyMeds Key/confirmation #/[facility] Status is pending

## 2023-06-23 NOTE — Telephone Encounter (Signed)
 Pharmacy Patient Advocate Encounter  Received notification from [facility] MEDICAID that Prior Authorization for Ibandronate 150mg  tabs has been DENIED.  See denial reason below. No denial letter attached in CMM. Will attach denial letter to Media tab once received.   PA #/Case ID/Reference #:  [id number]

## 2023-06-24 ENCOUNTER — Encounter: Payer: Self-pay | Admitting: Gastroenterology

## 2023-06-24 ENCOUNTER — Ambulatory Visit: Payer: Medicaid Other | Admitting: Gastroenterology

## 2023-06-24 ENCOUNTER — Other Ambulatory Visit (INDEPENDENT_AMBULATORY_CARE_PROVIDER_SITE_OTHER)

## 2023-06-24 VITALS — BP 106/74 | HR 84 | Ht 64.5 in | Wt 173.4 lb

## 2023-06-24 DIAGNOSIS — K50819 Crohn's disease of both small and large intestine with unspecified complications: Secondary | ICD-10-CM | POA: Diagnosis not present

## 2023-06-24 DIAGNOSIS — R1084 Generalized abdominal pain: Secondary | ICD-10-CM | POA: Insufficient documentation

## 2023-06-24 DIAGNOSIS — D84821 Immunodeficiency due to drugs: Secondary | ICD-10-CM | POA: Diagnosis not present

## 2023-06-24 DIAGNOSIS — K50919 Crohn's disease, unspecified, with unspecified complications: Secondary | ICD-10-CM

## 2023-06-24 DIAGNOSIS — R933 Abnormal findings on diagnostic imaging of other parts of digestive tract: Secondary | ICD-10-CM

## 2023-06-24 DIAGNOSIS — K50018 Crohn's disease of small intestine with other complication: Secondary | ICD-10-CM

## 2023-06-24 DIAGNOSIS — Z79899 Other long term (current) drug therapy: Secondary | ICD-10-CM

## 2023-06-24 DIAGNOSIS — K5000 Crohn's disease of small intestine without complications: Secondary | ICD-10-CM | POA: Diagnosis not present

## 2023-06-24 LAB — COMPREHENSIVE METABOLIC PANEL
ALT: 12 U/L (ref 0–35)
AST: 14 U/L (ref 0–37)
Albumin: 4.3 g/dL (ref 3.5–5.2)
Alkaline Phosphatase: 99 U/L (ref 39–117)
BUN: 14 mg/dL (ref 6–23)
CO2: 29 meq/L (ref 19–32)
Calcium: 9.7 mg/dL (ref 8.4–10.5)
Chloride: 103 meq/L (ref 96–112)
Creatinine, Ser: 0.44 mg/dL (ref 0.40–1.20)
GFR: 103.99 mL/min (ref >60.00)
Glucose, Bld: 93 mg/dL (ref 70–99)
Potassium: 4.3 meq/L (ref 3.5–5.1)
Sodium: 140 meq/L (ref 135–145)
Total Bilirubin: 0.4 mg/dL (ref 0.2–1.2)
Total Protein: 7 g/dL (ref 6.0–8.3)

## 2023-06-24 LAB — CBC
HCT: 41.3 % (ref 36.0–46.0)
Hemoglobin: 13.9 g/dL (ref 12.0–15.0)
MCHC: 33.7 g/dL (ref 30.0–36.0)
MCV: 89.6 fl (ref 78.0–100.0)
Platelets: 158 10*3/uL (ref 150.0–400.0)
RBC: 4.62 Mil/uL (ref 3.87–5.11)
RDW: 15.3 % (ref 11.5–15.5)
WBC: 5.4 10*3/uL (ref 4.0–10.5)

## 2023-06-24 LAB — C-REACTIVE PROTEIN: CRP: 1 mg/dL (ref 0.5–20.0)

## 2023-06-24 LAB — SEDIMENTATION RATE: Sed Rate: 56 mm/h — ABNORMAL HIGH (ref 0–30)

## 2023-06-24 NOTE — Patient Instructions (Addendum)
 You have been given information for Biologics : Humira, Skyrizi, Remicade and Entyvio. Please look over information as discussed with Dr Meridee Score.   Your provider has requested that you go to the basement level for lab work before leaving today. Press "B" on the elevator. The lab is located at the first door on the left as you exit the elevator.  Due to recent changes in healthcare laws, you may see the results of your imaging and laboratory studies on MyChart before your provider has had a chance to review them.  We understand that in some cases there may be results that are confusing or concerning to you. Not all laboratory results come back in the same time frame and the provider may be waiting for multiple results in order to interpret others.  Please give [place] 48 hours in order for your provider to thoroughly review all the results before contacting the office for clarification of your results.   Follow-up on  : 07/16/23 at 1:30 pm with Hyacinth Meeker PA  Thank you for choosing me and [facility].  Dr. Meridee Score

## 2023-06-24 NOTE — Progress Notes (Unsigned)
 GASTROENTEROLOGY OUTPATIENT CLINIC VISIT   Primary Care Provider Georgina Quint, MD [address] [place] [address] [phone]  Patient Profile: Lindsey Massey is a 63 y.o. female with a pmh significant for hypertension, osteoporosis, hyperlipidemia, anxiety, MDD, GERD, terminal ileitis most consistent with Crohn's disease (small bowel/ICV involvement).  The patient presents to the [facility] for an evaluation and management of problem(s) noted below:  Problem List 1. Crohn's disease of small and large intestines with complication (HCC)   2. Terminal ileitis with other complication (HCC)   3. Generalized abdominal pain   4. Immunosuppression due to drug therapy (HCC)   5. Abnormal colonoscopy    Discussed the use of AI scribe software for clinical note transcription with the patient, who gave verbal consent to proceed.  History of Present Illness Please see prior notes for full details of HPI.  Interval History This patient presents for follow-up and an Arabic medical interpreter is used for today's examination.  She was last seen at time of her colonoscopy/endoscopy earlier this year.  We confirmed endoscopically the terminal ileal and ICV inflammation that had been seen years prior and again on her more recent imaging, and the reasoning for her initiation of Azathioprine; thus, confirming her likely diagnosis of Crohn's disease.  She continues to take 50 mg of Azathioprine and has not had issues with this.  She continues to experience generalized abdominal discomfort and pain on a daily basis.  She has bowel movements on a daily basis for the most part and will rarely miss a day.  She has not had nocturnal bowel movements.  She is not having any blood passage per stool.  She is worried about whether surgery may be required.  She plans to travel to [place] soon to visit her sister and may be away for several months and worries about treatment during  her period away as well as occasional trips to [place].   GI Review of Systems Positive as above Negative for odynophagia, nausea, vomiting, melena, medic easier  Review of Systems General: Denies fevers/chills/weight loss unintentionally HEENT: Denies oral lesions Cardiovascular: Denies chest pain Pulmonary: Denies cough Gastroenterological: See HPI Genitourinary: Denies darkened urine Hematological: Denies easy bruising/bleeding Dermatological: Denies jaundice Psychological: Mood is stable   Medications Current Outpatient Medications  Medication Sig Dispense Refill   ALPRAZolam (XANAX) 0.5 MG tablet Take 1 tablet (0.5 mg total) by mouth daily as needed for anxiety. 30 tablet 1   aspirin EC 81 MG tablet Take 1 tablet (81 mg total) by mouth daily. Swallow whole. 30 tablet 0   azaTHIOprine (IMURAN) 50 MG tablet Take 1 tablet (50 mg total) by mouth daily. 90 tablet 1   calcium carbonate (CALCIUM ANTACID) 500 MG chewable tablet Chew 1 tablet (200 mg of elemental calcium total) by mouth daily. 30 tablet 0   candesartan (ATACAND) 8 MG tablet Take 1 tablet (8 mg total) by mouth daily. 180 tablet 3   Cholecalciferol (VITAMIN D3) 125 MCG (5000 UT) CAPS Take 1 capsule (5,000 Units total) by mouth daily. 30 capsule 0   conjugated estrogens (PREMARIN) vaginal cream Place 0.5 Applicatorfuls vaginally 3 (three) times a week. 42.5 g 2   Cyanocobalamin (VITAMIN B 12 PO) Take by mouth.     esomeprazole (NEXIUM) 40 MG capsule Take 1 capsule (40 mg total) by mouth daily. 90 capsule 1   ibandronate (BONIVA) 150 MG tablet Take 1 tablet (150 mg total) by mouth every 30 (thirty) days. Take in the  morning with a full glass of water, on an empty stomach, and do not take anything else by mouth or lie down for the next 30 min. 6 tablet 0   Magnesium 250 MG CAPS Take 250 mg by mouth daily. Takes 500 30 capsule 1   methocarbamol (ROBAXIN-750) 750 MG tablet Take 1 tablet (750 mg total) by mouth 2 (two) times daily  as needed for muscle spasms. 20 tablet 1   mirtazapine (REMERON) 15 MG tablet Take 1 tablet (15 mg total) by mouth at bedtime. 180 tablet 1   mupirocin ointment (BACTROBAN) 2 % Apply 1 Application topically 2 (two) times daily. 22 g 1   prednisoLONE acetate (PRED FORTE) 1 % ophthalmic suspension Place 1 drop into the left eye 4 (four) times daily. 5 mL 1   sertraline (ZOLOFT) 50 MG tablet Take 1 tablet (50 mg total) by mouth daily. 30 tablet 0   simvastatin (ZOCOR) 20 MG tablet Take 1 tablet (20 mg total) by mouth daily. 180 tablet 1   sucralfate (CARAFATE) 1 g tablet TAKE 1 TABLET BY MOUTH TWICE  DAILY 180 tablet 0   traMADol (ULTRAM) 50 MG tablet Take 1 tablet (50 mg total) by mouth 2 (two) times daily as needed. 30 tablet 0   Current Facility-Administered Medications  Medication Dose Route Frequency Provider Last Rate Last Admin   [START ON 07/17/2023] denosumab (PROLIA) injection 60 mg  60 mg Subcutaneous Once         Allergies No Known Allergies  Histories Past Medical History:  Diagnosis Date   Asthma    Cataract    Crohn disease (HCC)    Depressed    GERD (gastroesophageal reflux disease)    High cholesterol    Hx of tonsillectomy    Hypercholesteremia    Hypertension    Osteoporosis    Shortness of breath    sometimes before sleep   Uterine fibroid    Past Surgical History:  Procedure Laterality Date   DILATION AND CURRETTAGE     LASIK     [place] surgery [place] and then lasik done in [place]   TONSILLECTOMY     age 50   TONSILLECTOMY AND ADENOIDECTOMY     Social History   Socioeconomic History   Marital status: Divorced    Spouse name: Not on file   Number of children: Not on file   Years of education: Not on file   Highest education level: 12th grade  Occupational History   Not on file  Tobacco Use   Smoking status: Every Day    Current packs/day: 5.00    Average packs/day: 5.0 packs/day for 11.2 years (56.1 ttl pk-yrs)    Types: Cigarettes     Start date: 2014   Smokeless tobacco: Not on file  Vaping Use   Vaping status: Never Used  Substance and Sexual Activity   Alcohol use: No   Drug use: No   Sexual activity: Not Currently    Partners: Male    Birth control/protection: Post-menopausal    Comment: menarche 63yo, sexual debut 63yo  Other Topics Concern   Not on file  Social History Narrative   Lives with sister and nephews (18,16) in [place]. Moved from [place] in April 2012. Separated from husband. No children. Does not work.      Balanced diet. Little exercise.      Christian faith.   Social Drivers of Health   Financial Resource Strain: High Risk (04/30/2023)   Overall Financial  Resource Strain (CARDIA)    Difficulty of Paying Living Expenses: Very hard  Food Insecurity: Food Insecurity Present (04/30/2023)   Hunger Vital Sign    Worried About Running Out of Food in the Last Year: Often true    Ran Out of Food in the Last Year: Sometimes true  Transportation Needs: Unmet Transportation Needs (04/30/2023)   PRAPARE - Administrator, Civil Service (Medical): Yes    Lack of Transportation (Non-Medical): Yes  Physical Activity: Insufficiently Active (04/30/2023)   Exercise Vital Sign    Days of Exercise per Week: 2 days    Minutes of Exercise per Session: 10 min  Stress: Stress Concern Present (04/30/2023)   [employer] of Occupational Health - Occupational Stress Questionnaire    Feeling of Stress : Very much  Social Connections: Unknown (04/30/2023)   Social Connection and Isolation Panel [NHANES]    Frequency of Communication with Friends and Family: Twice a week    Frequency of Social Gatherings with Friends and Family: Patient declined    Attends Religious Services: Never    Database administrator or Organizations: No    Attends Engineer, structural: Not on file    Marital Status: Living with partner  Recent Concern: Social Connections - Moderately Isolated (03/10/2023)   Social  Connection and Isolation Panel [NHANES]    Frequency of Communication with Friends and Family: More than three times a week    Frequency of Social Gatherings with Friends and Family: More than three times a week    Attends Religious Services: More than 4 times per year    Active Member of [employer] or Organizations: No    Attends Engineer, structural: Not on file    Marital Status: Divorced  Catering manager Violence: Not on file   Family History  Problem Relation Age of Onset   Arthritis Mother    Hypertension Mother    Heart disease Mother    Cancer Father        liver   Hypertension Father    Arthritis Sister    Hypertension Brother    Multiple sclerosis Brother    Heart disease Paternal Uncle    Anesthesia problems Neg Hx    Colon cancer Neg Hx    Esophageal cancer Neg Hx    Inflammatory bowel disease Neg Hx    Liver disease Neg Hx    Pancreatic cancer Neg Hx    Rectal cancer Neg Hx    Stomach cancer Neg Hx    Colon polyps Neg Hx    I have reviewed her medical, social, and family history in detail and updated the electronic medical record as necessary.    PHYSICAL EXAMINATION  BP 106/74 (BP Location: Left Arm, Patient Position: Sitting, Cuff Size: Normal)   Pulse 84   Ht 5' 4.5" (1.638 m)   Wt 173 lb 6 oz (78.6 kg)   LMP 12/27/2010   BMI 29.30 kg/m  Wt Readings from Last 3 Encounters:  06/24/23 173 lb 6 oz (78.6 kg)  06/16/23 172 lb 6 oz (78.2 kg)  06/13/23 170 lb 3.2 oz (77.2 kg)  GEN: NAD, appears stated age, doesn't appear chronically ill PSYCH: Cooperative, without pressured speech EYE: Conjunctivae pink, sclerae anicteric ENT: MMM, without oral ulcers CV: Nontachycardic RESP: No audible wheezing GI: NABS, soft, TTP in lower quadrants bilaterally, without rebound or guarding, no HSM appreciated MSK/EXT: No significant lower extremity edema SKIN: No jaundice NEURO:  Alert & Oriented x  3, no focal deficits   REVIEW OF DATA  I reviewed the  following data at the time of this encounter:  GI Procedures and Studies  January 2025 EGD - No gross lesions in the entire esophagus. Z-line irregular, 35 cm from the incisors. - 1 cm hiatal hernia. - Gastritis. Biopsied. - No gross lesions in the duodenal bulb, in the first portion of the duodenum and in the second portion of the duodenum. Biopsied.  January 2025 colonoscopy - Hemorrhoids found on digital rectal exam. - Moderate mucosal changes were found in the ileum and terminal ileum with scarring 2 cm into the ICV secondary to ileitis. Biopsied. - Two 2 to 3 mm polyps at the recto-sigmoid colon and in the sigmoid colon, removed with a cold snare. Resected and retrieved. - Diverticulosis in the entire examined colon. - Normal mucosa in the entire examined colon. Biopsied. - Non-bleeding non-thrombosed external and internal hemorrhoids.  Pathology FINAL DIAGNOSIS      1. Surgical [P], gastritis :      - GASTRIC ANTRAL AND OXYNTIC MUCOSA WITH PATCHY CHRONIC GASTRITIS, SEE NOTE      2. Surgical [P], duodenal :      - DUODENAL MUCOSA WITH NO SPECIFIC HISTOPATHOLOGIC CHANGES      - NEGATIVE FOR INCREASED INTRAEPITHELIAL LYMPHOCYTES OR VILLOUS ARCHITECTURAL      CHANGES      3. Surgical [P], small bowel, terminal ileum :      - SEVERELY ACTIVE CHRONIC ILEITIS WITH ULCERATION, CONSISTENT WITH PATIENT'S      CLINICAL HISTORY OF CROHN'S DISEASE      - NEGATIVE FOR GRANULOMAS OR DYSPLASIA      4. Surgical [P], right colon bx :      - COLONIC MUCOSA WITH NO SPECIFIC HISTOPATHOLOGIC CHANGES      - NEGATIVE FOR ACUTE INFLAMMATION, FEATURES OF CHRONICITY, GRANULOMAS OR      DYSPLASIA      5. Surgical [P], left colon :      - COLONIC MUCOSA WITH NO SPECIFIC HISTOPATHOLOGIC CHANGES      - NEGATIVE FOR ACUTE INFLAMMATION, FEATURES OF CHRONICITY, GRANULOMAS OR      DYSPLASIA      6. Surgical [P], colon, sigmoid, recto-sigmoid, rectal, polyp (3) :      - HYPERPLASTIC POLYP(S)      7. Surgical [P],  left colon :      - COLONIC MUCOSA WITH NO SPECIFIC HISTOPATHOLOGIC CHANGES      - NEGATIVE FOR ACUTE INFLAMMATION, FEATURES OF CHRONICITY, GRANULOMAS OR      DYSPLASIA   Laboratory Studies  Reviewed those in EPIC  Imaging Studies  No new imaging studies to review   ASSESSMENT  Lindsey Massey is a 63 y.o. female with a pmh significant for hypertension, osteoporosis, hyperlipidemia, anxiety, MDD, GERD, terminal ileitis most consistent with Crohn's disease (small bowel/ICV involvement).  The patient is seen today for evaluation and management of:  1. Crohn's disease of small and large intestines with complication (HCC)   2. Terminal ileitis with other complication (HCC)   3. Generalized abdominal pain   4. Immunosuppression due to drug therapy (HCC)   5. Abnormal colonoscopy    The patient is hemodynamically stable.  From a GI perspective she also seems to be stable, but has now had confirmation of Crohn's disease based on her recent endoscopic evaluation and pathology.  Azathioprine will not be enough.  Lengthy discussion today about different medication regimens including Remicade/Humira/Entyvio/Skyrizi.  We discussed risks/benefits  of each therapy.  She has concerns about when she is traveling, if she is on infusions as to how she will be able to get those completed, so wonders if injectables or oral medications could be more effective for her.  She will consider and do further information/research.  We will plan to see her back in 3-4 weeks, before she leaves out of state to determine which medication regimen she would like to pursue to get insurance approvals.  Holding on Budesonide therapy, for now, but could do short course in future if she is still hold on biologic therapy for some time.  All patient questions were answered to the best of my ability, and the patient agrees to the aforementioned plan of action with follow-up as indicated.   PLAN  Labs as outlined below Fecal calprotectin  to be obtained Information given to patient about Biologics discussed above Consider short Budesonide trial, if still holding on Biologic therapy Followup in 3-4 weeks (before she leaves out of state) to determine which route of therapy she wants to pursue Repeat Colonoscopy at some point after therapeutic trial   Orders Placed This Encounter  Procedures   CALPROTECTIN   CBC   Comp Met (CMET)   Sedimentation rate   C-reactive protein    New Prescriptions   No medications on file   Modified Medications   No medications on file    Planned Follow Up No follow-ups on file.   Total Time in Face-to-Face and in Coordination of Care for patient including independent/personal interpretation/review of prior testing, medical history, examination, medication adjustment, communicating results with the patient directly with use of a medical interpreter, and documentation within the EHR is 45 minutes.   Corliss Parish, MD [facility] Advanced [facility]oscopy Office # [phone]

## 2023-06-28 ENCOUNTER — Other Ambulatory Visit: Payer: Self-pay | Admitting: Emergency Medicine

## 2023-06-28 ENCOUNTER — Other Ambulatory Visit: Payer: Self-pay | Admitting: Gastroenterology

## 2023-06-28 DIAGNOSIS — M810 Age-related osteoporosis without current pathological fracture: Secondary | ICD-10-CM

## 2023-06-30 MED ORDER — SUCRALFATE 1 G PO TABS: 1.0000 g | ORAL_TABLET | Freq: Two times a day (BID) | ORAL | 0 refills | Status: DC

## 2023-07-01 ENCOUNTER — Telehealth: Payer: Self-pay

## 2023-07-01 NOTE — Telephone Encounter (Signed)
 Received refill request dated 07/01/2023 from [facility] delivery for Premarin Vaginal Cream. This request has been filled on 06/13/2023 for #42.5 with 2 refills sent to [facility] ([address], [place])

## 2023-07-04 MED ORDER — IBANDRONATE SODIUM 150 MG PO TABS: 150.0000 mg | ORAL_TABLET | ORAL | 0 refills | Status: AC

## 2023-07-08 ENCOUNTER — Telehealth: Payer: Self-pay | Admitting: Emergency Medicine

## 2023-07-08 ENCOUNTER — Other Ambulatory Visit: Payer: Self-pay | Admitting: Emergency Medicine

## 2023-07-08 ENCOUNTER — Telehealth: Payer: Self-pay | Admitting: Gastroenterology

## 2023-07-08 DIAGNOSIS — I10 Essential (primary) hypertension: Secondary | ICD-10-CM

## 2023-07-08 NOTE — Telephone Encounter (Signed)
 Copied from CRM [id number]. Topic: Referral - Prior Authorization Question >> Jul 08, 2023  2:13 PM Pascal Lux wrote: Reason for CRM: Patient called and said her provider told her there is an injection she can take for Osteoporosis rather than taking the pills and she is calling to check the status of the prior authorization.  ---  Do you know if pt is referring to evenity or prolia?

## 2023-07-08 NOTE — Telephone Encounter (Signed)
 Copied from CRM [id number]. Topic: Clinical - Medication Refill >> Jul 08, 2023  2:15 PM Pascal Lux wrote: Most Recent Primary Care Visit:  Provider: Georgina Quint  Department: [facility]  Visit Type: OFFICE VISIT  Date: 06/16/2023  Medication: sertraline (ZOLOFT) 50 MG tablet [[id number]]  candesartan (ATACAND) 8 MG tablet [[id number]]  Has the patient contacted their pharmacy? No (Agent: If no, request that the patient contact the pharmacy for the refill. If patient does not wish to contact the pharmacy document the reason why and proceed with request.) (Agent: If yes, when and what did the pharmacy advise?)  Is this the correct pharmacy for this prescription? Yes If no, delete pharmacy and type the correct one.  This is the patient's preferred pharmacy:  [id number] [place], [place] - [address] [place] [address] Phone: [phone] Fax: [phone]    Has the prescription been filled recently? No  Is the patient out of the medication? Yes  Has the patient been seen for an appointment in the last year OR does the patient have an upcoming appointment? Yes  Can we respond through MyChart? No  Agent: Please be advised that Rx refills may take up to 3 business days. We ask that you follow-up with your pharmacy.

## 2023-07-08 NOTE — Telephone Encounter (Signed)
 Inbound call from patient requesting to speak with a nurse in regards to medications. Please advise.

## 2023-07-09 NOTE — Telephone Encounter (Signed)
 Tried returning call to patient. Had to leave message.

## 2023-07-10 ENCOUNTER — Other Ambulatory Visit: Payer: Self-pay | Admitting: Radiology

## 2023-07-10 DIAGNOSIS — M810 Age-related osteoporosis without current pathological fracture: Secondary | ICD-10-CM

## 2023-07-10 NOTE — Telephone Encounter (Signed)
 Referral placed. Will notify patient of this

## 2023-07-10 NOTE — Telephone Encounter (Signed)
 Spoke with patient yesterday and she is requesting ( if possible) someone contact her insurance company and help her figure out which biologic would have better coverage. Patient states that her english is very broken and its hard for her to understand. I will try to contact patients insurance this week.

## 2023-07-10 NOTE — Telephone Encounter (Signed)
 Refer to osteoporosis clinic please.

## 2023-07-15 ENCOUNTER — Other Ambulatory Visit: Payer: Self-pay | Admitting: Emergency Medicine

## 2023-07-15 DIAGNOSIS — I10 Essential (primary) hypertension: Secondary | ICD-10-CM

## 2023-07-15 MED ORDER — CANDESARTAN CILEXETIL 8 MG PO TABS: 8.0000 mg | ORAL_TABLET | Freq: Every day | ORAL | 3 refills | Status: DC

## 2023-07-15 MED ORDER — SERTRALINE HCL 50 MG PO TABS: 50.0000 mg | ORAL_TABLET | Freq: Every day | ORAL | 0 refills | Status: DC

## 2023-07-16 ENCOUNTER — Ambulatory Visit (INDEPENDENT_AMBULATORY_CARE_PROVIDER_SITE_OTHER): Admitting: Physician Assistant

## 2023-07-16 ENCOUNTER — Other Ambulatory Visit: Payer: Self-pay

## 2023-07-16 ENCOUNTER — Other Ambulatory Visit

## 2023-07-16 ENCOUNTER — Encounter: Payer: Self-pay | Admitting: Physician Assistant

## 2023-07-16 VITALS — BP 118/62 | HR 82 | Ht 66.0 in | Wt 170.0 lb

## 2023-07-16 DIAGNOSIS — K50819 Crohn's disease of both small and large intestine with unspecified complications: Secondary | ICD-10-CM

## 2023-07-16 DIAGNOSIS — K50018 Crohn's disease of small intestine with other complication: Secondary | ICD-10-CM

## 2023-07-16 DIAGNOSIS — K5080 Crohn's disease of both small and large intestine without complications: Secondary | ICD-10-CM | POA: Diagnosis not present

## 2023-07-16 MED ORDER — HUMIRA-CD/UC/HS STARTER 80 MG/0.8ML ~~LOC~~ AJKT
AUTO-INJECTOR | SUBCUTANEOUS | 0 refills | Status: DC
  Filled 2023-07-23 (×2): qty 2.4, 28d supply, fill #0

## 2023-07-16 MED ORDER — HUMIRA (2 PEN) 40 MG/0.8ML ~~LOC~~ AJKT
AUTO-INJECTOR | SUBCUTANEOUS | 6 refills | Status: DC
  Filled 2023-07-23: qty 6, 84d supply, fill #0

## 2023-07-16 MED ORDER — BUDESONIDE 3 MG PO CPEP: ORAL_CAPSULE | ORAL | 0 refills | Status: AC

## 2023-07-16 NOTE — Telephone Encounter (Signed)
 Patient will be given IBD handout/ Information today at clinic visit. Handout will have information that will help patient get assistance with arabic interpreter.

## 2023-07-16 NOTE — Patient Instructions (Addendum)
 Your provider has requested that you go to the basement level for lab work before leaving today. Press "B" on the elevator. The lab is located at the first door on the left as you exit the elevator.   We have sent the following medications to your pharmacy for you to pick up at your convenience: Budesonide.   The nurse will contact you about starting Humira.   _______________________________________________________  If your blood pressure at your visit was 140/90 or greater, please contact your primary care physician to follow up on this.  _______________________________________________________  If you are age 62 or older, your body mass index should be between 23-30. Your Body mass index is 27.44 kg/m. If this is out of the aforementioned range listed, please consider follow up with your Primary Care Provider.  If you are age 66 or younger, your body mass index should be between 19-25. Your Body mass index is 27.44 kg/m. If this is out of the aformentioned range listed, please consider follow up with your Primary Care Provider.   ________________________________________________________  The [facility] providers would like to encourage you to use [facility] to communicate with providers for non-urgent requests or questions.  Due to long hold times on the telephone, sending your provider a message by [facility] may be a faster and more efficient way to get a response.  Please allow 48 business hours for a response.  Please remember that this is for non-urgent requests.  _______________________________________________________

## 2023-07-16 NOTE — Progress Notes (Signed)
 Chief Complaint: Follow-up Crohn's disease  HPI:    Lindsey Massey is a 63 year old Arabic female with a past medical history as listed below including Crohn's disease with small bowel/ICV involvement, GERD and osteoporosis, known to Dr. Meridee Score, who resents to clinic today for follow-up of her Crohn's disease.    06/24/2023 patient seen in clinic by Dr. Meridee Score and at that time noted that she was only on Azathioprine.  She had a recent colonoscopy and endoscopy this year which confirmed terminal ileal and ICV inflammation.  At that time discussed starting her on a medication regimen including possible Remicade/Humira/Entyvio/Skyrizi.  She was to go home and do further research and decide what she wanted to do.  Noted that she was leaving out of town in a few weeks.    Today, patient presents to clinic accompanied by interpreter.  She tells me that she tried to discuss the various medications with her doctor friends back in [place] and they told her that we needed to make that decision on what she should take.  Went over some of the details again.  Humira would be easier for her to take if she is planning on traveling since she can give herself these injections and does not need to be here for an infusion.  She seemed to like that idea.  Currently no change in bowel habits which are typically once a day, does have abdominal pain that seems to radiate from her lower to right side abdomen on occasion.  She is leaving on May 20 and will be gone until September.  Continues on Azathioprine, Esomeprazole and Carafate.    Denies fever, chills, weight loss or blood in her stool.  GI Procedures and Studies  January 2025 EGD - No gross lesions in the entire esophagus. Z-line irregular, 35 cm from the incisors. - 1 cm hiatal hernia. - Gastritis. Biopsied. - No gross lesions in the duodenal bulb, in the first portion of the duodenum and in the second portion of the duodenum. Biopsied.   January 2025  colonoscopy - Hemorrhoids found on digital rectal exam. - Moderate mucosal changes were found in the ileum and terminal ileum with scarring 2 cm into the ICV secondary to ileitis. Biopsied. - Two 2 to 3 mm polyps at the recto-sigmoid colon and in the sigmoid colon, removed with a cold snare. Resected and retrieved. - Diverticulosis in the entire examined colon. - Normal mucosa in the entire examined colon. Biopsied. - Non-bleeding non-thrombosed external and internal hemorrhoids.   Pathology FINAL DIAGNOSIS      1. Surgical [P], gastritis :      - GASTRIC ANTRAL AND OXYNTIC MUCOSA WITH PATCHY CHRONIC GASTRITIS, SEE NOTE      2. Surgical [P], duodenal :      - DUODENAL MUCOSA WITH NO SPECIFIC HISTOPATHOLOGIC CHANGES      - NEGATIVE FOR INCREASED INTRAEPITHELIAL LYMPHOCYTES OR VILLOUS ARCHITECTURAL      CHANGES      3. Surgical [P], small bowel, terminal ileum :      - SEVERELY ACTIVE CHRONIC ILEITIS WITH ULCERATION, CONSISTENT WITH PATIENT'S      CLINICAL HISTORY OF CROHN'S DISEASE      - NEGATIVE FOR GRANULOMAS OR DYSPLASIA      4. Surgical [P], right colon bx :      - COLONIC MUCOSA WITH NO SPECIFIC HISTOPATHOLOGIC CHANGES      - NEGATIVE FOR ACUTE INFLAMMATION, FEATURES OF CHRONICITY, GRANULOMAS OR      DYSPLASIA  5. Surgical [P], left colon :      - COLONIC MUCOSA WITH NO SPECIFIC HISTOPATHOLOGIC CHANGES      - NEGATIVE FOR ACUTE INFLAMMATION, FEATURES OF CHRONICITY, GRANULOMAS OR      DYSPLASIA      6. Surgical [P], colon, sigmoid, recto-sigmoid, rectal, polyp (3) :      - HYPERPLASTIC POLYP(S)      7. Surgical [P], left colon :      - COLONIC MUCOSA WITH NO SPECIFIC HISTOPATHOLOGIC CHANGES      - NEGATIVE FOR ACUTE INFLAMMATION, FEATURES OF CHRONICITY, GRANULOMAS OR      DYSPLASIA    Past Medical History:  Diagnosis Date   Asthma    Cataract    Crohn disease (HCC)    Depressed    GERD (gastroesophageal reflux disease)    High cholesterol    Hx of tonsillectomy     Hypercholesteremia    Hypertension    Osteoporosis    Shortness of breath    sometimes before sleep   Uterine fibroid     Past Surgical History:  Procedure Laterality Date   DILATION AND CURRETTAGE     LASIK     [place] and then lasik done in [place]   TONSILLECTOMY     age 35   TONSILLECTOMY AND ADENOIDECTOMY      Current Outpatient Medications  Medication Sig Dispense Refill   ALPRAZolam (XANAX) 0.5 MG tablet Take 1 tablet (0.5 mg total) by mouth daily as needed for anxiety. 30 tablet 1   aspirin EC 81 MG tablet Take 1 tablet (81 mg total) by mouth daily. Swallow whole. 30 tablet 0   azaTHIOprine (IMURAN) 50 MG tablet Take 1 tablet (50 mg total) by mouth daily. 90 tablet 1   calcium carbonate (CALCIUM ANTACID) 500 MG chewable tablet Chew 1 tablet (200 mg of elemental calcium total) by mouth daily. 30 tablet 0   candesartan (ATACAND) 8 MG tablet TAKE 1 TABLET BY MOUTH EVERY DAY 180 tablet 3   Cholecalciferol (VITAMIN D3) 125 MCG (5000 UT) CAPS Take 1 capsule (5,000 Units total) by mouth daily. 30 capsule 0   conjugated estrogens (PREMARIN) vaginal cream Place 0.5 Applicatorfuls vaginally 3 (three) times a week. 42.5 g 2   Cyanocobalamin (VITAMIN B 12 PO) Take by mouth.     esomeprazole (NEXIUM) 40 MG capsule Take 1 capsule (40 mg total) by mouth daily. 90 capsule 1   ibandronate (BONIVA) 150 MG tablet Take 1 tablet (150 mg total) by mouth every 30 (thirty) days. Take in the morning with a full glass of water, on an empty stomach, and do not take anything else by mouth or lie down for the next 30 min. 6 tablet 0   Magnesium 250 MG CAPS Take 250 mg by mouth daily. Takes 500 30 capsule 1   methocarbamol (ROBAXIN-750) 750 MG tablet Take 1 tablet (750 mg total) by mouth 2 (two) times daily as needed for muscle spasms. 20 tablet 1   mirtazapine (REMERON) 15 MG tablet Take 1 tablet (15 mg total) by mouth at bedtime. 180 tablet 1   mupirocin ointment (BACTROBAN) 2 % Apply  1 Application topically 2 (two) times daily. 22 g 1   prednisoLONE acetate (PRED FORTE) 1 % ophthalmic suspension Place 1 drop into the left eye 4 (four) times daily. 5 mL 1   sertraline (ZOLOFT) 50 MG tablet Take 1 tablet (50 mg total) by mouth daily. 30 tablet 0  simvastatin (ZOCOR) 20 MG tablet Take 1 tablet (20 mg total) by mouth daily. 180 tablet 1   sucralfate (CARAFATE) 1 g tablet Take 1 tablet (1 g total) by mouth 2 (two) times daily. 180 tablet 0   traMADol (ULTRAM) 50 MG tablet Take 1 tablet (50 mg total) by mouth 2 (two) times daily as needed. 30 tablet 0   Current Facility-Administered Medications  Medication Dose Route Frequency Provider Last Rate Last Admin   [START ON 07/17/2023] denosumab (PROLIA) injection 60 mg  60 mg Subcutaneous Once         Allergies as of 07/16/2023   (No Known Allergies)    Family History  Problem Relation Age of Onset   Arthritis Mother    Hypertension Mother    Heart disease Mother    Cancer Father        liver   Hypertension Father    Arthritis Sister    Hypertension Brother    Multiple sclerosis Brother    Heart disease Paternal Uncle    Anesthesia problems Neg Hx    Colon cancer Neg Hx    Esophageal cancer Neg Hx    Inflammatory bowel disease Neg Hx    Liver disease Neg Hx    Pancreatic cancer Neg Hx    Rectal cancer Neg Hx    Stomach cancer Neg Hx    Colon polyps Neg Hx     Social History   Socioeconomic History   Marital status: Divorced    Spouse name: Not on file   Number of children: Not on file   Years of education: Not on file   Highest education level: 12th grade  Occupational History   Not on file  Tobacco Use   Smoking status: Every Day    Current packs/day: 5.00    Average packs/day: 5.0 packs/day for 11.3 years (56.3 ttl pk-yrs)    Types: Cigarettes    Start date: 2014   Smokeless tobacco: Not on file  Vaping Use   Vaping status: Never Used  Substance and Sexual Activity   Alcohol use: No   Drug use:  No   Sexual activity: Not Currently    Partners: Male    Birth control/protection: Post-menopausal    Comment: menarche 63yo, sexual debut 63yo  Other Topics Concern   Not on file  Social History Narrative   Lives with sister and nephews (18,16) in [place]. Moved from [place] in April 2012. Separated from husband. No children. Does not work.      Balanced diet. Little exercise.      Christian faith.   Social Drivers of Health   Financial Resource Strain: High Risk (04/30/2023)   Overall Financial Resource Strain (CARDIA)    Difficulty of Paying Living Expenses: Very hard  Food Insecurity: Food Insecurity Present (04/30/2023)   Hunger Vital Sign    Worried About Running Out of Food in the Last Year: Often true    Ran Out of Food in the Last Year: Sometimes true  Transportation Needs: Unmet Transportation Needs (04/30/2023)   PRAPARE - Transportation    Lack of Transportation (Medical): Yes    Lack of Transportation (Non-Medical): Yes  Physical Activity: Insufficiently Active (04/30/2023)   Exercise Vital Sign    Days of Exercise per Week: 2 days    Minutes of Exercise per Session: 10 min  Stress: Stress Concern Present (04/30/2023)   [employer] of Occupational Health - Occupational Stress Questionnaire    Feeling of Stress : Very much  Social Connections: Unknown (04/30/2023)   Social Connection and Isolation Panel [NHANES]    Frequency of Communication with Friends and Family: Twice a week    Frequency of Social Gatherings with Friends and Family: Patient declined    Attends Religious Services: Never    Database administrator or Organizations: No    Attends Engineer, structural: Not on file    Marital Status: Living with partner  Recent Concern: Social Connections - Moderately Isolated (03/10/2023)   Social Connection and Isolation Panel [NHANES]    Frequency of Communication with Friends and Family: More than three times a week    Frequency of Social  Gatherings with Friends and Family: More than three times a week    Attends Religious Services: More than 4 times per year    Active Member of [employer] or Organizations: No    Attends Engineer, structural: Not on file    Marital Status: Divorced  Catering manager Violence: Not on file    Review of Systems:    Constitutional: No weight loss, fever or chills Cardiovascular: No chest pain Respiratory: No SOB  Gastrointestinal: See HPI and otherwise negative   Physical Exam:  Vital signs: BP 118/62   Pulse 82   Ht 5\' 6"  (1.676 m)   Wt 170 lb (77.1 kg)   LMP 12/27/2010   BMI 27.44 kg/m   Constitutional:   Pleasant Arabic female appears to be in NAD, Well developed, Well nourished, alert and cooperative Respiratory: Respirations even and unlabored. Lungs clear to auscultation bilaterally.   No wheezes, crackles, or rhonchi.  Cardiovascular: Normal S1, S2. No MRG. Regular rate and rhythm. No peripheral edema, cyanosis or pallor.  Gastrointestinal:  Soft, nondistended, nontender. No rebound or guarding. Normal bowel sounds. No appreciable masses or hepatomegaly. Rectal:  Not performed.  Psychiatric: Demonstrates good judgement and reason without abnormal affect or behaviors.  RELEVANT LABS AND IMAGING: CBC    Component Value Date/Time   WBC 5.4 06/24/2023 1042   RBC 4.62 06/24/2023 1042   HGB 13.9 06/24/2023 1042   HCT 41.3 06/24/2023 1042   PLT 158.0 06/24/2023 1042   MCV 89.6 06/24/2023 1042   MCH 30.0 01/31/2023 0945   MCHC 33.7 06/24/2023 1042   RDW 15.3 06/24/2023 1042   LYMPHSABS 0.8 03/11/2023 1409   MONOABS 0.4 03/11/2023 1409   EOSABS 0.2 03/11/2023 1409   BASOSABS 0.0 03/11/2023 1409    CMP     Component Value Date/Time   NA 140 06/24/2023 1042   NA 141 02/26/2023 1534   K 4.3 06/24/2023 1042   CL 103 06/24/2023 1042   CO2 29 06/24/2023 1042   GLUCOSE 93 06/24/2023 1042   BUN 14 06/24/2023 1042   BUN 8 02/26/2023 1534   CREATININE 0.44 06/24/2023  1042   CALCIUM 9.7 06/24/2023 1042   PROT 7.0 06/24/2023 1042   ALBUMIN 4.3 06/24/2023 1042   AST 14 06/24/2023 1042   ALT 12 06/24/2023 1042   ALKPHOS 99 06/24/2023 1042   BILITOT 0.4 06/24/2023 1042   GFRNONAA >60 01/31/2023 0945   GFRAA >60 09/15/2014 1259    Assessment: 1.  Crohn's of the small large intestine: Confirmed during recent endoscopic evaluation and pathology, Azathioprine he is not a great therapy for this, patient has had 2 lengthy discussions now in regards to different medication regimens, she is leaning towards Humira as she does travel a lot and this would be easier for her to keep up, provided with a  pamphlet and information, we will also try to get this process started for insurance approval, she is going out of town on May 20 for 4 months  Plan: 1.  Ordered TPMT levels 2.  Continue Azathioprine as prescribed 3.  Prescribe Budesonide taper 9 mg x 4 weeks, then 6 mg x 2 weeks, then 3 mg x 2 weeks 4.  Will start approval for Humira, will let Dr. Meridee Score and Patty know 5.  If we can get Humira approved before patient leaves town on May 20 she would likely need to come in for education on how to give this to herself 6.  Continue Carafate 1 g twice daily and Nexium 40 daily for now 7.  Patient to follow with Dr. Meridee Score when she gets back in town  Lindsey Massey, [facility] 07/16/2023, 1:08 PM  Cc: Lindsey Massey, *

## 2023-07-17 ENCOUNTER — Other Ambulatory Visit: Payer: Self-pay

## 2023-07-17 ENCOUNTER — Other Ambulatory Visit (HOSPITAL_COMMUNITY): Payer: Self-pay

## 2023-07-17 ENCOUNTER — Telehealth: Payer: Self-pay

## 2023-07-17 DIAGNOSIS — N958 Other specified menopausal and perimenopausal disorders: Secondary | ICD-10-CM

## 2023-07-17 MED ORDER — PREMARIN 0.625 MG/GM VA CREA: 1.0000 g | TOPICAL_CREAM | VAGINAL | 2 refills | Status: DC

## 2023-07-17 NOTE — Telephone Encounter (Signed)
 PAP: Application for Humira has been submitted to [employer]), via fax

## 2023-07-17 NOTE — Telephone Encounter (Signed)
 Patient has new mail order pharmacy: [facility] Med refill request: premarin vaginal cream Last AEX: 06/13/23 Next AEX: none scheduled Last MMG (if hormonal med) 01/16/23 BI-RADS 1 negative Refill authorized: premarin vaginal cream Please approve or deny as appropriate.

## 2023-07-18 ENCOUNTER — Telehealth: Payer: Self-pay

## 2023-07-18 ENCOUNTER — Other Ambulatory Visit (HOSPITAL_COMMUNITY): Payer: Self-pay

## 2023-07-18 NOTE — Telephone Encounter (Signed)
 Pharmacy Patient Advocate Encounter   Received notification from Patient Pharmacy that prior authorization for Humira Pen-CD/UC/HS Starter (CF) 80MG /0.8ML auto-injector kit is required/requested.   Insurance verification completed.   The patient is insured through [facility] .   Prior Authorization for Humira Pen-CD/UC/HS Starter (CF) 80MG /0.8ML auto-injector kit has been APPROVED from 07-18-2023 to 07-17-2024   PA #/Case ID/Reference #: [id number]  Maintenance dosage also covered under this approval.

## 2023-07-18 NOTE — Telephone Encounter (Signed)
-----   Message from [facility] sent at 07/17/2023  5:08 PM EDT ----- Regarding: RE: Humira start JLL, Thanks for update. I think Humira is a good choice. GM ----- Message ----- From: Unk Lightning, PA Sent: 07/16/2023   2:36 PM EDT To: [name]., MD; [facility] Triage Subject: Humira start                                   Patient needs to be started on Humira, she is leaving town on May 20, would be great if we could get her started before she leaves.  Thanks, JL L

## 2023-07-18 NOTE — Telephone Encounter (Signed)
 See PA approval for Humira

## 2023-07-18 NOTE — Telephone Encounter (Signed)
 I have called and left a message the prescription has been sent and is ready for pick up.

## 2023-07-18 NOTE — Telephone Encounter (Signed)
 Note has been sent to the pt My Chart- she does view messages

## 2023-07-21 NOTE — Telephone Encounter (Signed)
 The pt has been advised of the approval for humira.

## 2023-07-21 NOTE — Telephone Encounter (Signed)
 Inbound call from  [place] with [facility]  calling requesting to speak with someone, she states they are missing information for patient. Please advise.     [phone]  Ext [phone]

## 2023-07-22 LAB — CALPROTECTIN: Calprotectin: 588 ug/g — ABNORMAL HIGH

## 2023-07-23 ENCOUNTER — Other Ambulatory Visit: Payer: Self-pay

## 2023-07-23 ENCOUNTER — Other Ambulatory Visit (HOSPITAL_COMMUNITY): Payer: Self-pay

## 2023-07-23 ENCOUNTER — Other Ambulatory Visit: Payer: Self-pay | Admitting: Gastroenterology

## 2023-07-23 ENCOUNTER — Other Ambulatory Visit: Payer: Self-pay | Admitting: Pharmacy Technician

## 2023-07-23 ENCOUNTER — Encounter: Payer: Self-pay | Admitting: Gastroenterology

## 2023-07-23 DIAGNOSIS — K50819 Crohn's disease of both small and large intestine with unspecified complications: Secondary | ICD-10-CM

## 2023-07-23 NOTE — Telephone Encounter (Signed)
 Unsure why sent to pa team as there is nothing further needed from us . PA was approved.

## 2023-07-23 NOTE — Progress Notes (Signed)
 Specialty Pharmacy Initial Fill Coordination Note  Lindsey Massey is a 63 y.o. female contacted today regarding initial fill of specialty medication(s) Adalimumab (Humira (2 Pen))   Patient requested Delivery   Delivery date: 07/29/23   Verified address: [name]   [name] [address]   Medication will be filled on 4.18.25.   Patient is aware of $4 copayment.

## 2023-07-23 NOTE — Progress Notes (Signed)
 Specialty Pharmacy Initiation Note   Lindsey Massey is a 63 y.o. female who will be followed by the specialty pharmacy service for RxSp Crohn's Disease   Called with interpreter ID [phone].    Review of administration, indication, effectiveness, safety, potential side effects, storage/disposable, and missed dose instructions occurred today for patient's specialty medication(s) Adalimumab (Humira (2 Pen))     Patient/Caregiver did not have any additional questions or concerns.   Patient's therapy is appropriate to: Initiate    Goals Addressed             This Visit's Progress    Minimize recurrence of flares       Patient is initiating therapy. Patient will maintain adherence         Rena Carnes Specialty Pharmacist

## 2023-07-24 ENCOUNTER — Other Ambulatory Visit: Payer: Self-pay

## 2023-07-25 ENCOUNTER — Other Ambulatory Visit (HOSPITAL_COMMUNITY): Payer: Self-pay

## 2023-07-25 LAB — THIOPURINE METHYLTRANSFERASE (TPMT), RBC: Thiopurine Methyltransferase, RBC: 15 nmol/h/mL

## 2023-07-25 NOTE — Telephone Encounter (Signed)
 Pharmacy Patient Advocate Encounter  Received notification from [facility] that Prior Authorization for  Ibandronate  Sodium 150MG  tablet has been DENIED.  Full denial letter will be uploaded to the media tab. See denial reason below.  Here are the policy requirements your request did not meet:   Per your health plan's criteria, this drug is covered if you meet the following:  One of the following:  (1) You have failed two preferred drugs as confirmed by claims history or submission of medical records. The preferred drugs: alendronate tablet (generic for Fosamax), raloxifene tablet (generic for Evista). () You cannot use two preferred drugs (please specify contraindication or intolerance).  PA #/Case ID/Reference #: [id number]

## 2023-07-28 ENCOUNTER — Other Ambulatory Visit: Payer: Self-pay

## 2023-07-29 ENCOUNTER — Other Ambulatory Visit: Payer: Self-pay

## 2023-07-29 MED ORDER — HUMIRA (2 PEN) 80 MG/0.8ML ~~LOC~~ AJKT: AUTO-INJECTOR | SUBCUTANEOUS | 6 refills | Status: DC

## 2023-07-30 ENCOUNTER — Other Ambulatory Visit: Payer: Self-pay

## 2023-07-30 ENCOUNTER — Other Ambulatory Visit (HOSPITAL_COMMUNITY): Payer: Self-pay

## 2023-07-30 MED ORDER — HUMIRA (2 PEN) 80 MG/0.8ML ~~LOC~~ AJKT
AUTO-INJECTOR | SUBCUTANEOUS | 6 refills | Status: DC
  Filled 2023-07-31 – 2023-08-16 (×2): qty 2, fill #0
  Filled 2023-08-18: qty 2, 28d supply, fill #0
  Filled 2023-08-28 – 2023-10-01 (×2): qty 2, 28d supply, fill #1

## 2023-07-30 NOTE — Progress Notes (Signed)
 Contacted secondary pharmacy that script was sent to. It was cancelled out and we are now able to process.

## 2023-07-31 ENCOUNTER — Other Ambulatory Visit: Payer: Self-pay

## 2023-07-31 ENCOUNTER — Other Ambulatory Visit (HOSPITAL_COMMUNITY): Payer: Self-pay

## 2023-07-31 NOTE — Telephone Encounter (Signed)
 Per Chart note PA approved for Humira  with [facility] medicaid until 07/17/2024. Approval letter in Media.

## 2023-08-05 ENCOUNTER — Other Ambulatory Visit: Payer: Self-pay

## 2023-08-06 ENCOUNTER — Other Ambulatory Visit: Payer: Self-pay | Admitting: Physician Assistant

## 2023-08-06 DIAGNOSIS — K50819 Crohn's disease of both small and large intestine with unspecified complications: Secondary | ICD-10-CM

## 2023-08-07 ENCOUNTER — Other Ambulatory Visit (HOSPITAL_COMMUNITY): Payer: Self-pay

## 2023-08-08 ENCOUNTER — Other Ambulatory Visit: Payer: Self-pay | Admitting: Emergency Medicine

## 2023-08-08 ENCOUNTER — Other Ambulatory Visit: Payer: Self-pay | Admitting: Gastroenterology

## 2023-08-08 DIAGNOSIS — K2100 Gastro-esophageal reflux disease with esophagitis, without bleeding: Secondary | ICD-10-CM

## 2023-08-08 DIAGNOSIS — K50919 Crohn's disease, unspecified, with unspecified complications: Secondary | ICD-10-CM

## 2023-08-08 DIAGNOSIS — Z1211 Encounter for screening for malignant neoplasm of colon: Secondary | ICD-10-CM

## 2023-08-10 ENCOUNTER — Other Ambulatory Visit: Payer: Self-pay

## 2023-08-10 ENCOUNTER — Emergency Department (HOSPITAL_COMMUNITY)
Admission: EM | Admit: 2023-08-10 | Discharge: 2023-08-10 | Disposition: A | Discharge status: Home / Self Care | Attending: Emergency Medicine | Admitting: Emergency Medicine

## 2023-08-10 ENCOUNTER — Encounter (HOSPITAL_COMMUNITY): Payer: Self-pay

## 2023-08-10 DIAGNOSIS — J45909 Unspecified asthma, uncomplicated: Secondary | ICD-10-CM | POA: Insufficient documentation

## 2023-08-10 DIAGNOSIS — Z79899 Other long term (current) drug therapy: Secondary | ICD-10-CM | POA: Diagnosis not present

## 2023-08-10 DIAGNOSIS — M25532 Pain in left wrist: Secondary | ICD-10-CM | POA: Diagnosis present

## 2023-08-10 DIAGNOSIS — R202 Paresthesia of skin: Secondary | ICD-10-CM | POA: Diagnosis not present

## 2023-08-10 DIAGNOSIS — I10 Essential (primary) hypertension: Secondary | ICD-10-CM | POA: Insufficient documentation

## 2023-08-10 DIAGNOSIS — R200 Anesthesia of skin: Secondary | ICD-10-CM

## 2023-08-10 DIAGNOSIS — Z7982 Long term (current) use of aspirin: Secondary | ICD-10-CM | POA: Insufficient documentation

## 2023-08-10 MED ORDER — PREDNISONE 10 MG (21) PO TBPK: ORAL_TABLET | Freq: Every day | ORAL | 0 refills | Status: AC

## 2023-08-10 NOTE — ED Triage Notes (Signed)
 Pt arrived reporting left wrist pain x1 week with rash on hand developing later. States pain is getting worse. No injury. Denies any itching. Patient states she has not been taking any meds or had any change in products. Arabic interpreter used [phone]

## 2023-08-10 NOTE — ED Provider Notes (Signed)
 [place] [facility] Provider Note   CSN: [id number] Arrival date & time: 08/10/23  1201     History  Chief Complaint  Patient presents with   Wrist Pain    Lindsey Massey is a 63 y.o. female history of Crohn's, hypertension, asthma presents with complaints of multiple complaints including left wrist pain and bilateral lower extremity numbness and tingling.  Denies any injury or trauma pain is localized to the radial aspect of the wrist.  Associated with numbness or tingling.  Regarding her legs she is still able to ambulate.  Denies significant back pain.  No saddle anesthesia.  No urinary incontinence.    Wrist Pain   Past Medical History:  Diagnosis Date   Asthma    Cataract    Crohn disease (HCC)    Depressed    GERD (gastroesophageal reflux disease)    High cholesterol    Hx of tonsillectomy    Hypercholesteremia    Hypertension    Osteoporosis    Shortness of breath    sometimes before sleep   Uterine fibroid        Home Medications Prior to Admission medications   Medication Sig Start Date End Date Taking? Authorizing Provider  predniSONE  (STERAPRED UNI-PAK 21 TAB) 10 MG (21) TBPK tablet Take by mouth daily. Take 6 tabs by mouth daily  for 2 days, then 5 tabs for 2 days, then 4 tabs for 2 days, then 3 tabs for 2 days, 2 tabs for 2 days, then 1 tab by mouth daily for 2 days 08/10/23  Yes Felicie Horning, PA-C  adalimumab  (HUMIRA , 2 PEN,) 80 MG/0.8ML pen Inject 1 pen on day 29 and then every 2 weeks 07/30/23   Graciella Lavender, PA  adalimumab  (HUMIRA -CD/UC/HS STARTER) 80 MG/0.8ML pen Inject 160 mg day 1, inject 80 mg day 15 07/16/23   Graciella Lavender, PA  ALPRAZolam  (XANAX ) 0.5 MG tablet Take 1 tablet (0.5 mg total) by mouth daily as needed for anxiety. 06/16/23   Elvira Hammersmith, MD  aspirin  EC 81 MG tablet Take 1 tablet (81 mg total) by mouth daily. Swallow whole. 01/24/23   Mansouraty, Albino Alu., MD   azaTHIOprine  (IMURAN ) 50 MG tablet TAKE 1 TABLET BY MOUTH DAILY 07/23/23   Mansouraty, Gabriel Jr., MD  budesonide  (ENTOCORT EC ) 3 MG 24 hr capsule Take 3 capsules (9 mg total) by mouth daily for 28 days, THEN 2 capsules (6 mg total) daily for 14 days, THEN 1 capsule (3 mg total) daily for 14 days. 07/16/23 09/10/23  Graciella Lavender, PA  calcium  carbonate (CALCIUM  ANTACID) 500 MG chewable tablet Chew 1 tablet (200 mg of elemental calcium  total) by mouth daily. 01/24/23   Mansouraty, Albino Alu., MD  candesartan  (ATACAND ) 8 MG tablet TAKE 1 TABLET BY MOUTH EVERY DAY 07/15/23   Elvira Hammersmith, MD  Cholecalciferol  (VITAMIN D3) 125 MCG (5000 UT) CAPS Take 1 capsule (5,000 Units total) by mouth daily. 01/24/23   Mansouraty, Gabriel Jr., MD  conjugated estrogens  (PREMARIN ) vaginal cream Place 0.5 Applicatorfuls vaginally 3 (three) times a week. 07/18/23   Chrzanowski, Jami B, NP  Cyanocobalamin (VITAMIN B 12 PO) Take by mouth.    [provider]  esomeprazole  (NEXIUM ) 40 MG capsule Take 1 capsule (40 mg total) by mouth daily. 05/19/23   Mansouraty, Albino Alu., MD  ibandronate  (BONIVA ) 150 MG tablet Take 1 tablet (150 mg total) by mouth every 30 (thirty) days. Take in the morning  with a full glass of water, on an empty stomach, and do not take anything else by mouth or lie down for the next 30 min. 07/04/23   Sagardia, Miguel Jose, MD  Magnesium  250 MG CAPS Take 250 mg by mouth daily. Takes 500 04/30/23   Elvira Hammersmith, MD  methocarbamol  (ROBAXIN -750) 750 MG tablet Take 1 tablet (750 mg total) by mouth 2 (two) times daily as needed for muscle spasms. 05/06/23   Sandie Cross, PA-C  mirtazapine  (REMERON ) 15 MG tablet Take 1 tablet (15 mg total) by mouth at bedtime. 06/16/23   Elvira Hammersmith, MD  mupirocin  ointment (BACTROBAN ) 2 % Apply 1 Application topically 2 (two) times daily. 06/16/23   Sagardia, Miguel Jose, MD  prednisoLONE  acetate (PRED FORTE ) 1 % ophthalmic suspension Place  1 drop into the left eye 4 (four) times daily. 06/16/23   Sagardia, Miguel Jose, MD  sertraline  (ZOLOFT ) 50 MG tablet TAKE 1 TABLET BY MOUTH EVERY DAY 08/08/23   Elvira Hammersmith, MD  simvastatin  (ZOCOR ) 20 MG tablet Take 1 tablet (20 mg total) by mouth daily. 06/16/23   Elvira Hammersmith, MD  sucralfate  (CARAFATE ) 1 g tablet Take 1 tablet (1 g total) by mouth 2 (two) times daily. 06/30/23   Mansouraty, Albino Alu., MD  traMADol  (ULTRAM ) 50 MG tablet Take 1 tablet (50 mg total) by mouth 2 (two) times daily as needed. 05/06/23   Sandie Cross, PA-C      Allergies    Patient has no known allergies.    Review of Systems   Review of Systems  Musculoskeletal:  Positive for arthralgias.    Physical Exam Updated Vital Signs BP 118/78 (BP Location: Left Arm)   Pulse 80   Temp 98.8 F (37.1 C) (Oral)   Resp 16   Ht 5\' 6"  (1.676 m)   Wt 77.1 kg   LMP 12/27/2010   SpO2 96%   BMI 27.43 kg/m  Physical Exam Vitals and nursing note reviewed.  Constitutional:      General: She is not in acute distress.    Appearance: She is well-developed.  HENT:     Head: Normocephalic and atraumatic.  Eyes:     Conjunctiva/sclera: Conjunctivae normal.  Cardiovascular:     Rate and Rhythm: Normal rate and regular rhythm.     Heart sounds: No murmur heard. Pulmonary:     Effort: Pulmonary effort is normal. No respiratory distress.     Breath sounds: Normal breath sounds.  Musculoskeletal:        General: No swelling.     Cervical back: Neck supple.     Comments: Tolerate full range of motion of radiocarpal joint, tenderness over the first dorsal compartment, no tenderness to anatomical snuffbox, capable of making a full fist, radial pulses 2+, NVI, no significant swelling, erythema or warmth  5 out of 5 lower extremity strength, no sensation deficit at this time, negative Homans, ambulates without difficulty, no midline spinal tenderness  Skin:    General: Skin is warm and dry.     Capillary  Refill: Capillary refill takes less than 2 seconds.  Neurological:     Mental Status: She is alert.  Psychiatric:        Mood and Affect: Mood normal.     ED Results / Procedures / Treatments   Labs (all labs ordered are listed, but only abnormal results are displayed) Labs Reviewed - No data to display  EKG None  Radiology No results found.  Procedures Procedures    Medications Ordered in ED Medications - No data to display  ED Course/ Medical Decision Making/ A&P                                 Medical Decision Making  This patient presents to the ED with chief complaint(s) of leg pain and leg numbness.  The complaint involves an extensive differential diagnosis and also carries with it a high risk of complications and morbidity.   Pertinent past medical history as listed in HPI  The differential diagnosis includes  Fracture, dislocation, septic joint, gout, tendinitis, CVA, TIA, cauda equina, sciatica/spinal radiculopathy Additional history obtained: Records reviewed Care Everywhere/External Records  Assessment and management:   Hemodynamically stable patient presenting with complaints of right wrist pain and bilateral lower extremity numbness and tingling.  Wrist pain has been ongoing for the past week without any injury or trauma.  Not associated with numbness or tingling.  On exam she has no significant swelling, erythema or warmth.  Tolerates full range of motion of radiocarpal joint.  No tenderness to anatomical snuffbox.  She does have tenderness over the first dorsal compartment, positive Finkelstein's test.  No suspicion for septic joint, gout.  No suspicion for fracture or dislocation do not feel that imaging is indicated today.  Overall most consistent with musculoskeletal etiology such as de Quervain's tenosynovitis.  Will place in a thumb spica and refer to Ortho.  Leg numbness has been intermittent for the past month.  No injury or trauma.  She is still able to  ambulate.  There is no neurodeficits on exam.  Negative Homans.  Low suspicion given DVT given bilateral onset.  She additionally has no history of blood clots and has no significant risk factors.  She has no saddle anesthesia or urinary incontinence.  No suspicion for cauda equina or spinal abscess.  She has no midline spinal tenderness.  Overall most consistent with sciatica/spinal radiculopathy.  Will send in prednisone  taper.  She will discuss this as well with her appointment with orthopedics.  Independent ECG interpretation:  none  Independent labs interpretation:  The following labs were independently interpreted:  none  Independent visualization and interpretation of imaging: I independently visualized the following imaging with scope of interpretation limited to determining acute life threatening conditions related to emergency care: none    Consultations obtained:   none  Disposition:   Patient will be discharged home. The patient has been appropriately medically screened and/or stabilized in the ED. I have low suspicion for any other emergent medical condition which would require further screening, evaluation or treatment in the ED or require inpatient management. At time of discharge the patient is hemodynamically stable and in no acute distress. I have discussed work-up results and diagnosis with patient and answered all questions. Patient is agreeable with discharge plan. We discussed strict return precautions for returning to the emergency department and they verbalized understanding.     Social Determinants of Health:   none  This note was dictated with voice recognition software.  Despite best efforts at proofreading, errors may have occurred which can change the documentation meaning.          Final Clinical Impression(s) / ED Diagnoses Final diagnoses:  Left wrist pain  Leg numbness    Rx / DC Orders ED Discharge Orders          Ordered    predniSONE   (STERAPRED  UNI-PAK 21 TAB) 10 MG (21) TBPK tablet  Daily        08/10/23 1335              Stanton Earthly 08/10/23 1402    Lind Repine, MD 08/10/23 [phone]

## 2023-08-10 NOTE — Progress Notes (Signed)
 Orthopedic Tech Progress Note Patient Details:  Lindsey Massey 07-Apr-1961 [id number]  Ortho Devices Type of Ortho Device: Velcro wrist splint Ortho Device/Splint Location: LUE Ortho Device/Splint Interventions: Ordered, Application, Adjustment   Post Interventions Patient Tolerated: Well Instructions Provided: Adjustment of device, Care of device  Herbie Loll 08/10/2023, 1:38 PM

## 2023-08-10 NOTE — Discharge Instructions (Signed)
 You were evaluated in the emergency room for left wrist pain.  This is consistent with tendinitis.  You were fitted with a wrist brace.  Please wear this throughout the day.  You may additionally use ice and Tylenol for pain.  Your leg numbness was consistent with sciatica/radiculopathy from your back.  Both of these were provided a referral for orthopedics.  A prescription for prednisone  has been sent into your pharmacy.  Please be sure to complete the full course of steroids.

## 2023-08-10 NOTE — ED Notes (Signed)
Brace applied by ortho

## 2023-08-11 ENCOUNTER — Telehealth: Payer: Self-pay

## 2023-08-11 NOTE — Transitions of Care (Post Inpatient/ED Visit) (Signed)
   08/11/2023  Name: Lindsey Massey MRN: [id number] DOB: [birth year]  Today's TOC FU Call Status: Today's TOC FU Call Status:: Unsuccessful Call (1st Attempt) Unsuccessful Call (1st Attempt) Date: 08/11/23  Attempted to reach the patient regarding the most recent Inpatient/ED visit.  Follow Up Plan: Additional outreach attempts will be made to reach the patient to complete the Transitions of Care (Post Inpatient/ED visit) call.   Signature : Devon Fogo, CMA

## 2023-08-12 ENCOUNTER — Encounter: Payer: Self-pay | Admitting: Emergency Medicine

## 2023-08-12 ENCOUNTER — Ambulatory Visit (INDEPENDENT_AMBULATORY_CARE_PROVIDER_SITE_OTHER)

## 2023-08-12 ENCOUNTER — Ambulatory Visit (INDEPENDENT_AMBULATORY_CARE_PROVIDER_SITE_OTHER): Admitting: Emergency Medicine

## 2023-08-12 VITALS — BP 122/82 | HR 73 | Temp 98.8°F | Ht 66.0 in | Wt 169.0 lb

## 2023-08-12 DIAGNOSIS — M25532 Pain in left wrist: Secondary | ICD-10-CM | POA: Diagnosis not present

## 2023-08-12 DIAGNOSIS — M810 Age-related osteoporosis without current pathological fracture: Secondary | ICD-10-CM | POA: Diagnosis not present

## 2023-08-12 DIAGNOSIS — I10 Essential (primary) hypertension: Secondary | ICD-10-CM | POA: Diagnosis not present

## 2023-08-12 DIAGNOSIS — K5080 Crohn's disease of both small and large intestine without complications: Secondary | ICD-10-CM

## 2023-08-12 DIAGNOSIS — E785 Hyperlipidemia, unspecified: Secondary | ICD-10-CM | POA: Diagnosis not present

## 2023-08-12 NOTE — Assessment & Plan Note (Signed)
 BP Readings from Last 3 Encounters:  08/12/23 122/82  08/10/23 118/78  07/16/23 118/62  Well-controlled hypertension Continue candesartan  8 mg daily Cardiovascular risks associated with hypertension discussed Diet and nutrition discussed

## 2023-08-12 NOTE — Patient Instructions (Signed)
??? ????? ??? ????????   Wrist Pain, Adult ??? ?????? ?? ?????? ???? ???? ?? ???? ??? ?????. ??? ??? ??????? ??????? ???? ?????:  ????? ?? ?????? ?????? ??? ?????? ?? ????? ?? ??? ?? ??? ??????.  ??? ??????? ??????.  ??????? ????? ????? ????? ?????? ??? ??? ????? ????? (??????? ????? ??????).  ????? ?????? ???? ???? ?? ???? ????? (?????? ??????).  ????? ???? ?? ?????? ???????? ??????? (?????? ???????). ??????? ???? ??? ??? ????? ???????. ???????? ?? ???? ????? ??? ?????? ???????? ???? ??????? ?????? ????? ????? ?????? ?? ??????? ??? ???? ??? ????? ?????? ?? ??? ????? ????? ?? ??????? ????? ?? ???? ???? ????? ?????. ??? ??? ????? ??? ?????? ??? ??????? ????? ???? ??????? ??????. ???? ????????? ??????? ?? ??????: ??? ??? ????? ????? ???? ????? ?? ?????? ??????:  ???? ??????? ?? ?????? ??? ??????? ???? ??????? ?????? ??????? ??? ??? ?????? ??? ??? ????? ???? ????? ??. ?????? ?? ????? ????? ????????? ?? ??.  ???? ????? ?????? ???????? ?? ??????? ??????. ????? ????? ??????? ?????? ??? ??? ????? ?????.  ??? ????? ??????? ?? ??????? ?? ???? ??????? ???? ?? ????? ??????? ?? ????? ???? ?? ??????? ??????? ??? ????? ??????.  ???? ??? ????? ??????? ?? ???????.  ??? ?? ??? ??????? ?? ??????? ?? ????? ?????? ?????: ? ??? ?????? ?????. ? ????? ????? ?? ???? ????? ????? ??? ????????? ?? ??? ????????? ?? ??? ????. ??????? ??? ????? ??????? ???????   ?? ????? ??? ??????? ???? ?????? ??? ????? ???? ??????? ?????? ????. ? ??? ??? ?????? ????? ?? ?????? ?? ??? ???? ?????? ?????? ????????? ???????? ???? ??????? ??????. ? ?? ????? ?? ??? ???????. ? ?? ????? ??? ????? ?????? ?? ??? ??????? ?? ??????? ??????. ? ???? ????? ???? 20 ?????? ???? ??? ????? 2-3 ???? ??????.  ??? ???? ??? ????? ??? ????? ?????? ??????? ???? ????? ????? ??? ?? ????? ?????? ?? ?????. ?????? ??? ?????? ???? ???????? ??? ??? ?? ?????? ?????? ?????? ?? ??????? ?? ???????.  ??? ?????? ???????? ?????? ?????? ???????.  ???? ??????? ??????? (?????)  ??? ????? ???? ?? ????? ?????? ?? ?????????. ??????  ????? ???????? ???? ??????? ?????? ????? ????? ????? ??????.  ???? ?????? ?????? ????????? ??? ??????? ???? ??????? ??????. ?????? ?? ????? ??????? ?????? ?? ??????? ???? ????? ???????? ?????.  ?????? ?? ???? ??????? ?????? ??? ???? ?? ????? ??????? ??? ??? ??? ??? ???? ????? ?? ?????? ??????.  ???? ??????? ??? ??????? ????? ??????? ??????. ??????? ????  ????? ???????? ????? ???????? ???? ???? ??? ??????? ???? ???????.  ????? ?????? ??????? ??? ?? ????? ?? ???? ??????? ??????? ????? ????? ????? ???? ?? ??? ???? ????. ???? ?????? ??????? ?????? ?? ??????? ???????:  ?????? ???? ??? ????? ???? ?? ????? ?? ????? ?? ???? ?? ??????.  ????? ?? ???? ?? ????? ?? ???? ?? ???.  ?????? ????? ?? ???? ??? ???? ???? ?? ??? ??????.  ??? ???? ????? ?? ????? ????.  ??????? ???? ?? ???????. ???? ???????? ????? ?? ??????? ???????:  ????? ?????? ?? ??????? ?? ????.  ???? ????? ???? ??????? ??? ????? ?????? ?? ?????? ?????? ?? ????? ??????.  ??? ????? ??? ????? ??????. ??? ????? ?? ??? ????????? ?? ???? ?????? ????????? ???? ?????? ???? ??????? ??????. ???? ?? ?????? ??? ????? ???? ?? ???? ?? ???? ??????? ??????.? Document Revised: 01/16/2022 Document Reviewed: 01/16/2022 [employer] Patient Education  2024 [employer].

## 2023-08-12 NOTE — Assessment & Plan Note (Signed)
 Stable chronic condition Diet and nutrition discussed Continue simvastatin 10 mg daily

## 2023-08-12 NOTE — Progress Notes (Signed)
 Lindsey Massey 63 y.o.   Chief Complaint  Patient presents with   Follow-up    6 month f/u. Patient states she's been having pain in her left hand not from an injury, she did go to the ED on 5/4. She mentions she's been having trouble getting her Humira  injection, her insurance wont cover 6 month supply, states shw will be traveling and will be gone for 5 months     HISTORY OF PRESENT ILLNESS: This is a 62 y.o. female here for 73-month follow-up of chronic medical conditions Also complaining of left wrist pain for about a week.  Was seen at urgent care center on 08/10/2023 for the same.  No x-ray done. Has questions about Humira .  She will be traveling to [place] later this month and will be gone for approximately 5 months. No other complaints or medical concerns today.  HPI   Prior to Admission medications   Medication Sig Start Date End Date Taking? Authorizing Provider  ALPRAZolam  (XANAX ) 0.5 MG tablet Take 1 tablet (0.5 mg total) by mouth daily as needed for anxiety. 06/16/23  Yes SagardiaIsidro Margo, MD  aspirin  EC 81 MG tablet Take 1 tablet (81 mg total) by mouth daily. Swallow whole. 01/24/23  Yes Mansouraty, Albino Alu., MD  azaTHIOprine  (IMURAN ) 50 MG tablet TAKE 1 TABLET BY MOUTH DAILY 07/23/23  Yes Mansouraty, Albino Alu., MD  budesonide  (ENTOCORT EC ) 3 MG 24 hr capsule Take 3 capsules (9 mg total) by mouth daily for 28 days, THEN 2 capsules (6 mg total) daily for 14 days, THEN 1 capsule (3 mg total) daily for 14 days. 07/16/23 09/10/23 Yes Lemmon, Kathy Parker, PA  calcium  carbonate (CALCIUM  ANTACID) 500 MG chewable tablet Chew 1 tablet (200 mg of elemental calcium  total) by mouth daily. 01/24/23  Yes Mansouraty, Albino Alu., MD  candesartan  (ATACAND ) 8 MG tablet TAKE 1 TABLET BY MOUTH EVERY DAY 07/15/23  Yes Buford Gayler, Isidro Margo, MD  Cholecalciferol  (VITAMIN D3) 125 MCG (5000 UT) CAPS Take 1 capsule (5,000 Units total) by mouth daily. 01/24/23  Yes Mansouraty, Albino Alu., MD   conjugated estrogens  (PREMARIN ) vaginal cream Place 0.5 Applicatorfuls vaginally 3 (three) times a week. 07/18/23  Yes Chrzanowski, Jami B, NP  Cyanocobalamin (VITAMIN B 12 PO) Take by mouth.   Yes [provider]  esomeprazole  (NEXIUM ) 40 MG capsule Take 1 capsule (40 mg total) by mouth daily. 05/19/23  Yes Mansouraty, Albino Alu., MD  ibandronate  (BONIVA ) 150 MG tablet Take 1 tablet (150 mg total) by mouth every 30 (thirty) days. Take in the morning with a full glass of water, on an empty stomach, and do not take anything else by mouth or lie down for the next 30 min. 07/04/23  Yes Elvira Hammersmith, MD  Magnesium  250 MG CAPS Take 250 mg by mouth daily. Takes 500 04/30/23  Yes Breia Ocampo, Isidro Margo, MD  methocarbamol  (ROBAXIN -750) 750 MG tablet Take 1 tablet (750 mg total) by mouth 2 (two) times daily as needed for muscle spasms. 05/06/23  Yes Sandie Cross, PA-C  mirtazapine  (REMERON ) 15 MG tablet Take 1 tablet (15 mg total) by mouth at bedtime. 06/16/23  Yes Shondrika Hoque, Isidro Margo, MD  mupirocin  ointment (BACTROBAN ) 2 % Apply 1 Application topically 2 (two) times daily. 06/16/23  Yes Madisyn Mawhinney, Isidro Margo, MD  prednisoLONE  acetate (PRED FORTE ) 1 % ophthalmic suspension Place 1 drop into the left eye 4 (four) times daily. 06/16/23  Yes Fender Herder, Isidro Margo, MD  predniSONE  (STERAPRED UNI-PAK 21 TAB)  10 MG (21) TBPK tablet Take by mouth daily. Take 6 tabs by mouth daily  for 2 days, then 5 tabs for 2 days, then 4 tabs for 2 days, then 3 tabs for 2 days, 2 tabs for 2 days, then 1 tab by mouth daily for 2 days 08/10/23  Yes Felicie Horning, PA-C  sertraline  (ZOLOFT ) 50 MG tablet TAKE 1 TABLET BY MOUTH EVERY DAY 08/08/23  Yes Larna Capelle, Isidro Margo, MD  simvastatin  (ZOCOR ) 20 MG tablet Take 1 tablet (20 mg total) by mouth daily. 06/16/23  Yes Derral Colucci, Isidro Margo, MD  sucralfate  (CARAFATE ) 1 g tablet Take 1 tablet (1 g total) by mouth 2 (two) times daily. 06/30/23  Yes Mansouraty, Albino Alu., MD   traMADol  (ULTRAM ) 50 MG tablet Take 1 tablet (50 mg total) by mouth 2 (two) times daily as needed. 05/06/23  Yes Sandie Cross, PA-C  adalimumab  (HUMIRA , 2 PEN,) 80 MG/0.8ML pen Inject 1 pen on day 29 and then every 2 weeks Patient not taking: Reported on 08/12/2023 07/30/23   Graciella Lavender, PA  adalimumab  (HUMIRA -CD/UC/HS STARTER) 80 MG/0.8ML pen Inject 160 mg day 1, inject 80 mg day 15 Patient not taking: Reported on 08/12/2023 07/16/23   Graciella Lavender, PA    No Known Allergies  Patient Active Problem List   Diagnosis Date Noted   Generalized abdominal pain 06/24/2023   Abnormal colonoscopy 06/24/2023   Crohn's disease of small and large intestines with complication (HCC) 06/24/2023   Skin infection 06/16/2023   Crohn's disease of both small and large intestine without complication (HCC) 04/30/2023   Chronic left shoulder pain 04/30/2023   Essential hypertension 01/08/2023   Gastroesophageal reflux disease 01/08/2023   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) 01/08/2023   Immunosuppression due to drug therapy (HCC) 01/08/2023   Dyslipidemia 09/16/2011   Osteoporosis 09/16/2011   GERD without esophagitis 09/16/2011   Uterine fibroid 06/26/2011   Depression 06/26/2011   Hypercholesterolemia 06/26/2011    Past Medical History:  Diagnosis Date   Asthma    Cataract    Crohn disease (HCC)    Depressed    GERD (gastroesophageal reflux disease)    High cholesterol    Hx of tonsillectomy    Hypercholesteremia    Hypertension    Osteoporosis    Shortness of breath    sometimes before sleep   Uterine fibroid     Past Surgical History:  Procedure Laterality Date   DILATION AND CURRETTAGE     LASIK     [place] and then [place] done in [place]    TONSILLECTOMY     age 78   TONSILLECTOMY AND ADENOIDECTOMY      Social History   Socioeconomic History   Marital status: Divorced    Spouse name: Not on file   Number of children: Not on file    Years of education: Not on file   Highest education level: 12th grade  Occupational History   Not on file  Tobacco Use   Smoking status: Every Day    Current packs/day: 5.00    Average packs/day: 5.0 packs/day for 11.3 years (56.7 ttl pk-yrs)    Types: Cigarettes    Start date: 2014   Smokeless tobacco: Not on file  Vaping Use   Vaping status: Never Used  Substance and Sexual Activity   Alcohol use: No   Drug use: No   Sexual activity: Not Currently    Partners: Male    Birth control/protection: Post-menopausal  Comment: menarche 63yo, sexual debut 63yo  Other Topics Concern   Not on file  Social History Narrative   Lives with sister and nephews (18,16) in [place]. Moved from [place]  in April 2012. Separated from husband. No children. Does not work.      Balanced diet. Little exercise.      Christian faith.   Social Drivers of Corporate investment banker Strain: High Risk (04/30/2023)   Overall Financial Resource Strain (CARDIA)    Difficulty of Paying Living Expenses: Very hard  Food Insecurity: Food Insecurity Present (04/30/2023)   Hunger Vital Sign    Worried About Running Out of Food in the Last Year: Often true    Ran Out of Food in the Last Year: Sometimes true  Transportation Needs: Unmet Transportation Needs (04/30/2023)   PRAPARE - Administrator, Civil Service (Medical): Yes    Lack of Transportation (Non-Medical): Yes  Physical Activity: Insufficiently Active (04/30/2023)   Exercise Vital Sign    Days of Exercise per Week: 2 days    Minutes of Exercise per Session: 10 min  Stress: Stress Concern Present (04/30/2023)   [employer] of Occupational Health - Occupational Stress Questionnaire    Feeling of Stress : Very much  Social Connections: Unknown (04/30/2023)   Social Connection and Isolation Panel [NHANES]    Frequency of Communication with Friends and Family: Twice a week    Frequency of Social Gatherings with Friends and Family:  Patient declined    Attends Religious Services: Never    Database administrator or Organizations: No    Attends Engineer, structural: Not on file    Marital Status: Living with partner  Recent Concern: Social Connections - Moderately Isolated (03/10/2023)   Social Connection and Isolation Panel [NHANES]    Frequency of Communication with Friends and Family: More than three times a week    Frequency of Social Gatherings with Friends and Family: More than three times a week    Attends Religious Services: More than 4 times per year    Active Member of [employer] or Organizations: No    Attends Engineer, structural: Not on file    Marital Status: Divorced  Catering manager Violence: Not on file    Family History  Problem Relation Age of Onset   Arthritis Mother    Hypertension Mother    Heart disease Mother    Cancer Father        liver   Hypertension Father    Arthritis Sister    Hypertension Brother    Multiple sclerosis Brother    Heart disease Paternal Uncle    Anesthesia problems Neg Hx    Colon cancer Neg Hx    Esophageal cancer Neg Hx    Inflammatory bowel disease Neg Hx    Liver disease Neg Hx    Pancreatic cancer Neg Hx    Rectal cancer Neg Hx    Stomach cancer Neg Hx    Colon polyps Neg Hx      Review of Systems  Constitutional: Negative.  Negative for chills and fever.  HENT: Negative.  Negative for congestion and sore throat.   Respiratory: Negative.  Negative for cough.   Cardiovascular: Negative.  Negative for chest pain and palpitations.  Gastrointestinal:  Negative for abdominal pain, nausea and vomiting.  Musculoskeletal:  Positive for joint pain (Left wrist).  Skin: Negative.  Negative for rash.  Neurological: Negative.  Negative for dizziness and headaches.  All  other systems reviewed and are negative.   Vitals:   08/12/23 1313  BP: 122/82  Pulse: 73  Temp: 98.8 F (37.1 C)  SpO2: 95%    Physical Exam Vitals reviewed.   Constitutional:      Appearance: Normal appearance.  HENT:     Head: Normocephalic.     Mouth/Throat:     Mouth: Mucous membranes are moist.     Pharynx: Oropharynx is clear.  Eyes:     Extraocular Movements: Extraocular movements intact.     Pupils: Pupils are equal, round, and reactive to light.  Cardiovascular:     Rate and Rhythm: Normal rate and regular rhythm.     Pulses: Normal pulses.     Heart sounds: Normal heart sounds.  Pulmonary:     Effort: Pulmonary effort is normal.     Breath sounds: Normal breath sounds.  Abdominal:     Palpations: Abdomen is soft.     Tenderness: There is no abdominal tenderness.  Musculoskeletal:     Cervical back: No tenderness.     Comments: Left wrist: Mild swelling and tenderness with decreased range of motion Wearing a splint.  No erythema or signs of infection.  Neurovascularly intact distally  Lymphadenopathy:     Cervical: No cervical adenopathy.  Skin:    General: Skin is warm and dry.     Capillary Refill: Capillary refill takes less than 2 seconds.  Neurological:     General: No focal deficit present.     Mental Status: She is alert and oriented to person, place, and time.  Psychiatric:        Mood and Affect: Mood normal.        Behavior: Behavior normal.    DG Wrist 2 Views Left Result Date: 08/12/2023 CLINICAL DATA:  Left wrist pain for 1 week.  No known injury. EXAM: LEFT WRIST - 2 VIEW COMPARISON:  None Available. FINDINGS: No acute fracture or dislocation. No aggressive osseous lesion. Minimal diffuse degenerative arthritic changes of imaged joints. No radiopaque foreign bodies. Soft tissues are within normal limits. IMPRESSION: No acute osseous abnormality of the left wrist joint. Electronically Signed   By: Beula Brunswick M.D.   On: 08/12/2023 13:54     ASSESSMENT & PLAN: A total of 44 minutes was spent with the patient and counseling/coordination of care regarding preparing for this visit, review of most recent  office visit notes, review of multiple chronic medical conditions and their management, review of all medications, review of most recent bloodwork results, review of health maintenance items, education on nutrition, prognosis, documentation, and need for follow up.  Problem List Items Addressed This Visit       Cardiovascular and Mediastinum   Essential hypertension - Primary   BP Readings from Last 3 Encounters:  08/12/23 122/82  08/10/23 118/78  07/16/23 118/62  Well-controlled hypertension Continue candesartan  8 mg daily Cardiovascular risks associated with hypertension discussed Diet and nutrition discussed         Digestive   Crohn's disease of both small and large intestine without complication (HCC)   Recently seen by GI doctor Recent upper and lower endoscopy reports reviewed Crohn's medications handled by GI's office        Musculoskeletal and Integument   Osteoporosis   DEXA scan report from 05/06/2023 reviewed with patient Shows osteoporosis Recommend Prolia  injections every 6 months        Other   Dyslipidemia   Stable chronic condition Diet and nutrition discussed Continue  simvastatin  10 mg daily      Left wrist pain   Clinical findings consistent with tendinitis. Unremarkable x-ray. Recommend to continue wearing splint and follow-up with orthopedist Pain management discussed Alternate cold and warm compresses as needed      Relevant Orders   DG Wrist 2 Views Left (Completed)   Patient Instructions  ??? ????? ??? ???????? Wrist Pain, Adult ??? ?????? ?? ?????? ???? ???? ?? ???? ??? ?????. ??? ??? ??????? ??????? ???? ?????:  ????? ?? ?????? ?????? ??? ?????? ?? ????? ?? ??? ?? ??? ??????.  ??? ??????? ??????.  ??????? ????? ????? ????? ?????? ??? ??? ????? ????? (??????? ????? ??????).  ????? ?????? ???? ???? ?? ???? ????? (?????? ??????).  ????? ???? ?? ?????? ???????? ??????? (?????? ???????). ??????? ???? ??? ??? ????? ???????. ???????? ??  ???? ????? ??? ?????? ???????? ???? ??????? ?????? ????? ????? ?????? ?? ??????? ??? ???? ??? ????? ?????? ?? ??? ????? ????? ?? ??????? ????? ?? ???? ???? ????? ?????. ??? ??? ????? ??? ?????? ??? ??????? ????? ???? ??????? ??????. ???? ????????? ??????? ?? ??????: ??? ??? ????? ????? ???? ????? ?? ?????? ??????:  ???? ??????? ?? ?????? ??? ??????? ???? ??????? ?????? ??????? ??? ??? ?????? ??? ??? ????? ???? ????? ??. ?????? ?? ????? ????? ????????? ?? ??.  ???? ????? ?????? ???????? ?? ??????? ??????. ????? ????? ??????? ?????? ??? ??? ????? ?????.  ??? ????? ??????? ?? ??????? ?? ???? ??????? ???? ?? ????? ??????? ?? ????? ???? ?? ??????? ??????? ??? ????? ??????.  ???? ??? ????? ??????? ?? ???????.  ??? ?? ??? ??????? ?? ??????? ?? ????? ?????? ?????: ? ??? ?????? ?????. ? ????? ????? ?? ???? ????? ????? ??? ????????? ?? ??? ????????? ?? ??? ????. ??????? ??? ????? ??????? ???????   ?? ????? ??? ??????? ???? ?????? ??? ????? ???? ??????? ?????? ????. ? ??? ??? ?????? ????? ?? ?????? ?? ??? ???? ?????? ?????? ????????? ???????? ???? ??????? ??????. ? ?? ????? ?? ??? ???????. ? ?? ????? ??? ????? ?????? ?? ??? ??????? ?? ??????? ??????. ? ???? ????? ???? 20 ?????? ???? ??? ????? 2-3 ???? ??????.  ??? ???? ??? ????? ??? ????? ?????? ??????? ???? ????? ????? ??? ?? ????? ?????? ?? ?????. ?????? ??? ?????? ???? ???????? ??? ??? ?? ?????? ?????? ?????? ?? ??????? ?? ???????.  ??? ?????? ???????? ?????? ?????? ???????.  ???? ??????? ??????? (?????) ??? ????? ???? ?? ????? ?????? ?? ?????????. ??????  ????? ???????? ???? ??????? ?????? ????? ????? ????? ??????.  ???? ?????? ?????? ????????? ??? ??????? ???? ??????? ??????. ?????? ?? ????? ??????? ?????? ?? ??????? ???? ????? ???????? ?????.  ?????? ?? ???? ??????? ?????? ??? ???? ?? ????? ??????? ??? ??? ??? ??? ???? ????? ?? ?????? ??????.  ???? ??????? ??? ??????? ????? ??????? ??????. ??????? ????  ????? ???????? ????? ???????? ???? ????  ??? ??????? ???? ???????.  ????? ?????? ??????? ??? ?? ????? ?? ???? ??????? ??????? ????? ????? ????? ???? ?? ??? ???? ????. ???? ?????? ??????? ?????? ?? ??????? ???????:  ?????? ???? ??? ????? ???? ?? ????? ?? ????? ?? ???? ?? ??????.  ????? ?? ???? ?? ????? ?? ???? ?? ???.  ?????? ????? ?? ???? ??? ???? ???? ?? ??? ??????.  ??? ???? ????? ?? ????? ????.  ??????? ???? ?? ???????. ???? ???????? ????? ?? ??????? ???????:  ????? ?????? ?? ??????? ?? ????.  ???? ????? ???? ??????? ??? ????? ?????? ?? ?????? ?????? ?? ????? ??????.  ??? ????? ??? ????? ??????. ??? ????? ?? ??? ????????? ?? ???? ?????? ????????? ???? ?????? ???? ??????? ??????. ???? ?? ?????? ??? ????? ???? ?? ???? ?? ???? ??????? ??????Aaron Aas?  Document Revised: 01/16/2022 Document Reviewed: 01/16/2022 Elsevier Patient Education  2024 Elsevier Inc.    Maryagnes Small, MD [facility]

## 2023-08-12 NOTE — Assessment & Plan Note (Signed)
 Recently seen by GI doctor Recent upper and lower endoscopy reports reviewed Crohn's medications handled by [facility] office

## 2023-08-12 NOTE — Assessment & Plan Note (Signed)
 DEXA scan report from 05/06/2023 reviewed with patient Shows osteoporosis Recommend Prolia  injections every 6 months

## 2023-08-12 NOTE — Assessment & Plan Note (Signed)
 Clinical findings consistent with tendinitis. Unremarkable x-ray. Recommend to continue wearing splint and follow-up with orthopedist Pain management discussed Alternate cold and warm compresses as needed

## 2023-08-13 NOTE — Addendum Note (Signed)
 Addended by: Aneita Keens on: 08/13/2023 01:41 PM   Modules accepted: Orders

## 2023-08-14 ENCOUNTER — Other Ambulatory Visit: Payer: Self-pay

## 2023-08-14 ENCOUNTER — Other Ambulatory Visit: Payer: Self-pay | Admitting: Gastroenterology

## 2023-08-14 MED ORDER — ADALIMUMAB 80 MG/0.8ML ~~LOC~~ AJKT
80.0000 mg | AUTO-INJECTOR | SUBCUTANEOUS | 0 refills | Status: DC
  Filled 2023-08-14 – 2023-08-16 (×2): qty 9.6, 168d supply, fill #0

## 2023-08-14 NOTE — Addendum Note (Signed)
 Addended by: Aneita Keens on: 08/14/2023 08:35 AM   Modules accepted: Orders

## 2023-08-18 ENCOUNTER — Ambulatory Visit: Admitting: Physician Assistant

## 2023-08-18 ENCOUNTER — Encounter: Payer: Self-pay | Admitting: Physician Assistant

## 2023-08-18 ENCOUNTER — Other Ambulatory Visit: Payer: Self-pay

## 2023-08-18 ENCOUNTER — Other Ambulatory Visit (HOSPITAL_COMMUNITY): Payer: Self-pay

## 2023-08-18 ENCOUNTER — Encounter (HOSPITAL_COMMUNITY): Payer: Self-pay

## 2023-08-18 VITALS — Ht 65.5 in | Wt 171.0 lb

## 2023-08-18 DIAGNOSIS — M810 Age-related osteoporosis without current pathological fracture: Secondary | ICD-10-CM | POA: Diagnosis not present

## 2023-08-18 NOTE — Progress Notes (Signed)
 Patient is leaving country for approximately 6 months. Insurance does not allow exceptions or overrides for vacation. Patient is requestion one shipment before departure. Patient will leave 5/16, and return approximately 10/10.Pausing patient.   Specialty Pharmacy Refill Coordination Note  Lindsey Massey is a 63 y.o. female contacted today regarding refills of specialty medication(s) Adalimumab  (Humira  (2 Pen))   Patient requested Delivery   Delivery date: 08/20/23   Verified address: [address] ,[place] .[address]   Medication will be filled on 08/19/23.

## 2023-08-18 NOTE — Progress Notes (Addendum)
 Office Visit Note   Patient: Lindsey Massey           Date of Birth: 06/02/1960           MRN: [id number] Visit Date: 08/18/2023              Requested by: Elvira Hammersmith, MD [address] [place],  [place] [address] PCP: Elvira Hammersmith, MD   Assessment & Plan: Visit Diagnoses: Osteoporosis  Plan: Patient is a 63 year old woman who is seen with the help of an Arabic interpreter today.  She was referred from her primary care for evaluation of osteoporosis treatments.   Her most recent score for her lumbar spine is -3.2.  She also is supplementing with vitamin D  and calcium  thus the calcium  gives her constipation.  We talked away about how to take that more effectively.  She does have a history of heart disease.  No history of cancer or kidney disease.  Has a history of seizures.  She went through menopause at 23.  She takes vitamin D  weekly.  Has not done hormone replacement therapy she is a current smoker smoking a half a pack of cigarettes a day.  All she does is walk she does not drink.  Her primary care has begun the process of trying to authorize for Prolia .  I explained to her that if this was rejected by the primary care office there would be nothing I could do differently.   Would refer her back for her primary care doctor since they have begun the process of preauthorization.  Also counseled her about smoking cessation.  She also has a history of rheumatoid arthritis which also makes her risk factor.  Also she could add some strength training that would be helpful spent 45 minutes for interpretation.  I agree that Prolia  would be a good option for her.  She is about to go travel for about 6 months.  I told her if I start the authorization process it would have to be started from the beginning again and the outcome would probably do the same.  It is unclear with me whether she wanted to go on Prolia .  Again I would be very doubtful that this would be approved but it looks like  it was placed as an authorization.  Would be happy to see her in the future once she returns from [place] but again would wait and see what the outcome is on their authorization for Prolia .  Patient also was requesting a copy of her bone DEXA scan images.  I told her I could not provide her with that that that would have to be done at the radiology facility.  Again it was unclear to me if she had been on anything such as Fosamax.  It looks like Boniva  was not approved.  She seemed to want an injectable because she said she could not do the other medication because she would forget to take it  Follow-Up Instructions: Return if symptoms worsen or fail to improve.   Orders:  No orders of the defined types were placed in this encounter.  No orders of the defined types were placed in this encounter.     Procedures: No procedures performed   Clinical Data: No additional findings.   Subjective: Chief Complaint  Patient presents with   Osteoporosis    HPI Patient presents today she has a history of osteoporosis referred by her primary care physician.  She has a recent score  of her T-score of -3.2 Review of Systems  All other systems reviewed and are negative.    Objective: Vital Signs: Ht 5' 5.5" (1.664 m)   Wt 171 lb (77.6 kg)   LMP 12/27/2010   BMI 28.02 kg/m   Physical Exam Constitutional:      Appearance: Normal appearance.  Pulmonary:     Effort: Pulmonary effort is normal.  Skin:    General: Skin is warm and dry.  Neurological:     General: No focal deficit present.     Mental Status: She is alert and oriented to person, place, and time.  Psychiatric:        Mood and Affect: Mood normal.        Behavior: Behavior normal.       Specialty Comments:  No specialty comments available.  Imaging: No results found.   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) 01/08/2023   Immunosuppression due to drug therapy (HCC) 01/08/2023   Dyslipidemia 09/16/2011   Age-related osteoporosis without current pathological fracture 09/16/2011   GERD without esophagitis 09/16/2011   Uterine fibroid 06/26/2011   Depression 06/26/2011   Hypercholesterolemia 06/26/2011   Past Medical History:  Diagnosis Date   Asthma    Cataract    Crohn disease (HCC)    Depressed    GERD (gastroesophageal reflux disease)    High cholesterol    Hx of tonsillectomy    Hypercholesteremia    Hypertension    Osteoporosis    Shortness of breath    sometimes before sleep   Uterine fibroid     Family History  Problem Relation Age of Onset   Arthritis Mother    Hypertension Mother    Heart disease Mother    Cancer Father        liver   Hypertension Father    Arthritis Sister    Hypertension Brother    Multiple sclerosis Brother    Heart disease Paternal Uncle    Anesthesia problems Neg Hx    Colon cancer Neg Hx    Esophageal cancer Neg Hx    Inflammatory bowel disease Neg Hx    Liver disease Neg Hx    Pancreatic cancer Neg Hx    Rectal cancer Neg Hx    Stomach cancer Neg Hx    Colon polyps Neg Hx     Past Surgical History:  Procedure Laterality Date   DILATION AND CURRETTAGE     LASIK     [place] and then [place]    TONSILLECTOMY     age 32   TONSILLECTOMY AND ADENOIDECTOMY     Social History   Occupational History   Not on file  Tobacco Use   Smoking status: Every Day    Current packs/day: 5.00    Average packs/day: 5.0 packs/day for 11.4 years (56.8 ttl pk-yrs)    Types: Cigarettes    Start  date: 2014   Smokeless tobacco: Not on file  Vaping Use   Vaping status: Never Used  Substance and Sexual Activity   Alcohol use: No  Drug use: No   Sexual activity: Not Currently    Partners: Male    Birth control/protection: Post-menopausal    Comment: menarche 63yo, sexual debut 63yo

## 2023-08-19 ENCOUNTER — Other Ambulatory Visit (HOSPITAL_COMMUNITY): Payer: Self-pay

## 2023-08-19 NOTE — Progress Notes (Signed)
 Mail on 05/14. RTSoon, pt notified.

## 2023-08-20 ENCOUNTER — Other Ambulatory Visit: Payer: Self-pay

## 2023-08-28 ENCOUNTER — Other Ambulatory Visit (HOSPITAL_COMMUNITY): Payer: Self-pay

## 2023-08-28 ENCOUNTER — Telehealth: Payer: Self-pay

## 2023-08-28 ENCOUNTER — Encounter: Payer: Self-pay | Admitting: Gastroenterology

## 2023-08-28 ENCOUNTER — Other Ambulatory Visit: Payer: Self-pay | Admitting: Physician Assistant

## 2023-08-28 ENCOUNTER — Other Ambulatory Visit: Payer: Self-pay | Admitting: Gastroenterology

## 2023-08-28 ENCOUNTER — Other Ambulatory Visit: Payer: Self-pay | Admitting: Emergency Medicine

## 2023-08-28 DIAGNOSIS — I10 Essential (primary) hypertension: Secondary | ICD-10-CM

## 2023-08-28 NOTE — Telephone Encounter (Signed)
 Pharmacy Patient Advocate Encounter   Received notification from RX Request Messages that prior authorization for Candesartan  8mg  tab is required/requested.   Insurance verification completed.   The patient is insured through [facility] MEDICAID .   Per test claim: PA required; PA submitted to above mentioned insurance via CoverMyMeds Key/confirmation #/EOC [id number] Status is pending

## 2023-08-28 NOTE — Telephone Encounter (Signed)
Needs PA please.

## 2023-08-28 NOTE — Progress Notes (Signed)
 [facility] ENT clinic visit note  This is from 08/08/2023 Patient noting to this provider that she is having swallowing issues with both solids and liquids.  She also has a globus sensation. She had a flexible laryngoscopy with evidence of LPR.  Endoscopy in January did not show evidence of EOE or significant esophagitis.  I recommend the patient for her dysphagia symptoms have a repeat EGD with empiric dilation.  If this does not improve her symptoms, then an esophageal manometry will be considered for her.  We will offer her a direct endoscopy if she wishes in the coming weeks.  I will forward this to my team to work on reaching out to patient and potentially scheduling.   Yong Henle, MD [facility] Advanced Endoscopy Office # [phone]

## 2023-08-28 NOTE — Progress Notes (Signed)
 Called and spoke with patient via Arabic interpreter. Attempted to schedule EGD. Patient requests something prior to May 29th as she will be traveling for 3 months after that. Advised patient that Dr. Brice Campi does not have anything available. Gave phone number to patient and she states she will call when she gets back from traveling to schedule.

## 2023-08-29 ENCOUNTER — Other Ambulatory Visit: Payer: Self-pay

## 2023-08-29 ENCOUNTER — Other Ambulatory Visit (HOSPITAL_COMMUNITY): Payer: Self-pay

## 2023-08-29 NOTE — Progress Notes (Signed)
 Clinical Intervention Note  Clinical Intervention Notes: Patient called to say she threw away the last pen because the liquid color looked different.  I asked if there was anyway to retrieve the pen, as we would need a LOT # and Expiration date in order to pursue a replacement from the manufacture.  She said it was already gone.  I advised in the future if she notices anything off with her prescription to call us  immediately so that we can assist in determining if a replacement is needed and assist in getting the replacement.  While on the call, she also reports that she is going out of the country for 3-6 months and has no way to get the medication in the country she is traveling to.  We pursued a vacation override, but her insurance denied this.  I advised her to contact her provider's office to see if they have samples available or determine a good treatment plan for her for the time she will be out of the country.  I provided her with her provider's office number as she did not remember which office she saw. All questions were answered.   Clinical Intervention Outcomes: Improved therapy adherence   Malachi Screws Specialty Pharmacist

## 2023-09-02 NOTE — Telephone Encounter (Signed)
 Pharmacy Patient Advocate Encounter  Received notification from [facility] MEDICAID that Prior Authorization for Candesartan  8mg  tabs has been DENIED.  See denial reason below. No denial letter attached in CMM. Will attach denial letter to Media tab once received.   PA #/Case ID/Reference #: [id number]

## 2023-09-08 ENCOUNTER — Other Ambulatory Visit: Payer: Self-pay | Admitting: Gastroenterology

## 2023-09-08 ENCOUNTER — Other Ambulatory Visit: Payer: Self-pay | Admitting: Emergency Medicine

## 2023-09-08 DIAGNOSIS — Z1211 Encounter for screening for malignant neoplasm of colon: Secondary | ICD-10-CM

## 2023-09-08 DIAGNOSIS — E785 Hyperlipidemia, unspecified: Secondary | ICD-10-CM

## 2023-09-08 DIAGNOSIS — K50919 Crohn's disease, unspecified, with unspecified complications: Secondary | ICD-10-CM

## 2023-09-08 DIAGNOSIS — F418 Other specified anxiety disorders: Secondary | ICD-10-CM

## 2023-09-08 DIAGNOSIS — K2100 Gastro-esophageal reflux disease with esophagitis, without bleeding: Secondary | ICD-10-CM

## 2023-09-09 ENCOUNTER — Ambulatory Visit: Payer: 59 | Admitting: Emergency Medicine

## 2023-09-26 ENCOUNTER — Other Ambulatory Visit: Payer: Self-pay | Admitting: Radiology

## 2023-09-26 DIAGNOSIS — N958 Other specified menopausal and perimenopausal disorders: Secondary | ICD-10-CM

## 2023-09-26 NOTE — Telephone Encounter (Signed)
 Med refill request: premarin  vaginal cream Last AEX: 06/13/23 Next AEX: none scheduled Last MMG (if hormonal med) 01/16/23 BI-RADS 1 negative Refill authorized: Please approve or deny as appropriate.

## 2023-09-30 ENCOUNTER — Ambulatory Visit (AMBULATORY_SURGERY_CENTER)

## 2023-09-30 VITALS — Ht 66.0 in | Wt 168.0 lb

## 2023-09-30 DIAGNOSIS — R1310 Dysphagia, unspecified: Secondary | ICD-10-CM

## 2023-09-30 NOTE — Progress Notes (Signed)
 No egg or soy allergy known to patient  No issues known to pt with past sedation with any surgeries or procedures Patient denies ever being told they had issues or difficulty with intubation  No FH of Malignant Hyperthermia Pt is not on diet pills nor GLP-1 medications Pt is not on home 02  Pt is not on blood thinners  Pt states occasional issues with chronic constipation; pt states has bm every 1-2 days No A fib or A flutter Have any cardiac testing pending--no Pt instructed to use [email or web address] or GoodRx for a price reduction on prep  Ambulates independently Arabic interpreter present for PV

## 2023-10-02 ENCOUNTER — Other Ambulatory Visit: Payer: Self-pay

## 2023-10-02 ENCOUNTER — Other Ambulatory Visit: Payer: Self-pay | Admitting: Emergency Medicine

## 2023-10-02 DIAGNOSIS — I10 Essential (primary) hypertension: Secondary | ICD-10-CM

## 2023-10-02 NOTE — Telephone Encounter (Signed)
 Copied from CRM [id number]. Topic: Clinical - Medication Refill >> Oct 02, 2023 10:19 AM Powell HERO wrote: Medication: adalimumab  (HUMIRA -CD/UC/HS STARTER) 80 MG/0.8ML pen, candesartan  (ATACAND ) 8 MG tablet , sertraline  (ZOLOFT ) 50 MG tablet  Has the patient contacted their pharmacy? Yes No refills  This is the patient's preferred pharmacy:   [facility] - [place], [place] - [address] [place] [address] Phone: [phone] Fax: [phone]  Is this the correct pharmacy for this prescription? Yes If no, delete pharmacy and type the correct one.   Has the prescription been filled recently? Yes  Is the patient out of the medication? Yes  Has the patient been seen for an appointment in the last year OR does the patient have an upcoming appointment? Yes  Can we respond through MyChart? Yes  Agent: Please be advised that Rx refills may take up to 3 business days. We ask that you follow-up with your pharmacy.

## 2023-10-03 MED ORDER — CANDESARTAN CILEXETIL 8 MG PO TABS: 8.0000 mg | ORAL_TABLET | Freq: Every day | ORAL | 3 refills | Status: DC

## 2023-10-03 MED ORDER — SERTRALINE HCL 50 MG PO TABS: 50.0000 mg | ORAL_TABLET | Freq: Every day | ORAL | 1 refills | Status: DC

## 2023-10-05 ENCOUNTER — Emergency Department (HOSPITAL_COMMUNITY)
Admission: EM | Admit: 2023-10-05 | Discharge: 2023-10-05 | Disposition: A | Discharge status: Home / Self Care | Attending: Emergency Medicine | Admitting: Emergency Medicine

## 2023-10-05 ENCOUNTER — Other Ambulatory Visit: Payer: Self-pay

## 2023-10-05 ENCOUNTER — Emergency Department (HOSPITAL_COMMUNITY)

## 2023-10-05 DIAGNOSIS — L089 Local infection of the skin and subcutaneous tissue, unspecified: Secondary | ICD-10-CM | POA: Insufficient documentation

## 2023-10-05 DIAGNOSIS — Z7982 Long term (current) use of aspirin: Secondary | ICD-10-CM | POA: Diagnosis not present

## 2023-10-05 DIAGNOSIS — R2231 Localized swelling, mass and lump, right upper limb: Secondary | ICD-10-CM | POA: Diagnosis present

## 2023-10-05 MED ORDER — CEPHALEXIN 500 MG PO CAPS: 500.0000 mg | ORAL_CAPSULE | Freq: Four times a day (QID) | ORAL | 0 refills | Status: AC

## 2023-10-05 MED ORDER — CEPHALEXIN 500 MG PO CAPS: 500.0000 mg | ORAL_CAPSULE | Freq: Four times a day (QID) | ORAL | 0 refills | Status: DC

## 2023-10-05 MED ORDER — OXYCODONE-ACETAMINOPHEN 5-325 MG PO TABS: 1.0000 | ORAL_TABLET | Freq: Four times a day (QID) | ORAL | 0 refills | Status: DC | PRN

## 2023-10-05 MED ORDER — IBUPROFEN 600 MG PO TABS
600.0000 mg | ORAL_TABLET | Freq: Four times a day (QID) | ORAL | 0 refills | Status: AC | PRN
Start: 2023-10-05 — End: ?

## 2023-10-05 MED ORDER — CEPHALEXIN 500 MG PO CAPS
500.0000 mg | ORAL_CAPSULE | Freq: Once | ORAL | Status: AC
  Administered 2023-10-05: 500 mg via ORAL
  Filled 2023-10-05: qty 1

## 2023-10-05 MED ORDER — OXYCODONE-ACETAMINOPHEN 5-325 MG PO TABS
1.0000 | ORAL_TABLET | Freq: Once | ORAL | Status: AC
  Administered 2023-10-05: 1 via ORAL
  Filled 2023-10-05: qty 1

## 2023-10-05 NOTE — Discharge Instructions (Signed)
????? ???????????? ??   6 ????? ?????? ????? ?????????. ????? ?????? ?????? ??? ???? ?????? ?????? ??????? ???. ?? ????? ??????? ????? ????? ??????? ?????? ???? ??? ???????. ????? ??? ??? ??? ??????? ?????? ??? ???? ?? ????? ??????? ?????? ?????. ??? ??? ??? ??????? ?? ??? ????? ??????? ?? ???? ?? ????? ?????. tanawal al'iibubrufin kula 6 saeat litakhfif al'alam walailtihabu. tanawul almudadi alhayawii hasb wasfat altabib lileadwaa almushtabah biha. min almuhimi alaitisal bitabib alyad, alduktur murfi, sabah yawm alaithnayni. 'akhbarah 'anak zirt qism altawari watahtaj 'iilaa maweid fi bidayat al'usbue li'iieadat alfahas. eud 'iilaa qism altawari fi hal tafaqum al'aerad 'aw zuhur 'ayi makhawif jadidatin.  Take ibuprofen every 6 hours for pain and inflammation. Take the antibiotic as prescribed for the suspected infection. It is important that you call the hand doctor, Dr. Beverley, on Monday morning. Let them know you were seen in the emergency department and need an appointment early in the week for recheck. Return to the emergency department with any worsening symptoms or new concerns.

## 2023-10-05 NOTE — ED Triage Notes (Signed)
 Patient to ED by POV with c/o right finger pain that started last night denies injury or HX of arthritis.

## 2023-10-05 NOTE — ED Provider Notes (Signed)
 [facility] EMERGENCY DEPARTMENT AT [facility] Provider Note   CSN: [id number] Arrival date & time: 10/05/23  9184     Patient presents with: Finger Injury   Lindsey Massey is a 63 y.o. female.   Patient with pain and swelling to right 4th finger x 1 day that started without known injury or trauma. No fever. No history of similar symptoms.   The history is provided by the patient. A language interpreter was used.       Prior to Admission medications   Medication Sig Start Date End Date Taking? Authorizing Provider  ibuprofen (ADVIL) 600 MG tablet Take 1 tablet (600 mg total) by mouth every 6 (six) hours as needed. 10/05/23  Yes Odell Balls, PA-C  oxyCODONE-acetaminophen (PERCOCET/ROXICET) 5-325 MG tablet Take 1 tablet by mouth every 6 (six) hours as needed for severe pain (pain score 7-10). 10/05/23  Yes Whitman Meinhardt, PA-C  adalimumab  (HUMIRA ) 80 MG/0.8ML pen Inject 0.8 mLs (80 mg total) into the skin every 14 (fourteen) days. Inject 1 pen on day 29 then every 14 days inject 1 pen 08/14/23 02/10/24  Mansouraty, Gabriel Jr., MD  adalimumab  (HUMIRA , 2 PEN,) 80 MG/0.8ML pen Inject 1 pen on day 29 and then every 2 weeks Patient not taking: Reported on 08/12/2023 07/30/23   Beather Delon Gibson, PA  adalimumab  (HUMIRA -CD/UC/HS STARTER) 80 MG/0.8ML pen Inject 160 mg day 1, inject 80 mg day 15 07/16/23   Lemmon, Delon Gibson, PA  ALPRAZolam  (XANAX ) 0.5 MG tablet TAKE 1 TABLET BY MOUTH EVERY DAY AS NEEDED FOR ANXIETY 09/08/23   Purcell Emil Schanz, MD  aspirin  EC 81 MG tablet Take 1 tablet (81 mg total) by mouth daily. Swallow whole. 01/24/23   Mansouraty, Aloha Raddle., MD  azaTHIOprine  (IMURAN ) 50 MG tablet TAKE 1 TABLET BY MOUTH DAILY 07/23/23   Mansouraty, Gabriel Jr., MD  calcium  carbonate (CALCIUM  ANTACID) 500 MG chewable tablet Chew 1 tablet (200 mg of elemental calcium  total) by mouth daily. Patient not taking: Reported on 09/30/2023 01/24/23   Mansouraty, Aloha Raddle., MD   candesartan  (ATACAND ) 8 MG tablet Take 1 tablet (8 mg total) by mouth daily. 10/03/23   Purcell Emil Schanz, MD  cephALEXin (KEFLEX) 500 MG capsule Take 1 capsule (500 mg total) by mouth 4 (four) times daily. 10/05/23   Odell Balls, PA-C  Cholecalciferol  (VITAMIN D3) 125 MCG (5000 UT) CAPS Take 1 capsule (5,000 Units total) by mouth daily. 01/24/23   Mansouraty, Gabriel Jr., MD  Cyanocobalamin (VITAMIN B 12 PO) Take by mouth.    [provider]  esomeprazole  (NEXIUM ) 40 MG capsule TAKE 1 CAPSULE BY MOUTH DAILY 08/14/23   Mansouraty, Gabriel Jr., MD  fluticasone (FLONASE) 50 MCG/ACT nasal spray Place 1 spray into both nostrils 2 (two) times daily. 09/05/23   [provider]  ibandronate  (BONIVA ) 150 MG tablet Take 1 tablet (150 mg total) by mouth every 30 (thirty) days. Take in the morning with a full glass of water, on an empty stomach, and do not take anything else by mouth or lie down for the next 30 min. 07/04/23   Sagardia, Miguel Jose, MD  Magnesium  250 MG CAPS Take 250 mg by mouth daily. Takes 500 04/30/23   Purcell Emil Schanz, MD  methocarbamol  (ROBAXIN -750) 750 MG tablet Take 1 tablet (750 mg total) by mouth 2 (two) times daily as needed for muscle spasms. 05/06/23   Jule Ronal CROME, PA-C  mirtazapine  (REMERON ) 15 MG tablet Take 1 tablet (15 mg total) by  mouth at bedtime. 06/16/23   Purcell Emil Schanz, MD  mupirocin  ointment (BACTROBAN ) 2 % Apply 1 Application topically 2 (two) times daily. 06/16/23   Purcell Emil Schanz, MD  ofloxacin (OCUFLOX) 0.3 % ophthalmic solution PLEASE SEE ATTACHED FOR DETAILED DIRECTIONS 07/01/23   [provider]  prednisoLONE  acetate (PRED FORTE ) 1 % ophthalmic suspension Place 1 drop into the left eye 4 (four) times daily. 06/16/23   Purcell Emil Schanz, MD  predniSONE  (DELTASONE ) 10 MG tablet Take by mouth. 08/10/23   [provider]  predniSONE  (STERAPRED UNI-PAK 21 TAB) 10 MG (21) TBPK tablet Take by mouth daily. Take 6  tabs by mouth daily  for 2 days, then 5 tabs for 2 days, then 4 tabs for 2 days, then 3 tabs for 2 days, 2 tabs for 2 days, then 1 tab by mouth daily for 2 days 08/10/23   Donnajean Lynwood DEL, PA-C  PREMARIN  vaginal cream PLACE ONE-HALF APPLICATORFUL  VAGINALLY 3 TIMES WEEKLY 09/26/23   Chrzanowski, Jami B, NP  sertraline  (ZOLOFT ) 50 MG tablet Take 1 tablet (50 mg total) by mouth daily. 10/03/23   Purcell Emil Schanz, MD  simvastatin  (ZOCOR ) 20 MG tablet TAKE 1 TABLET BY MOUTH EVERY DAY 09/08/23   Purcell Emil Schanz, MD  sucralfate  (CARAFATE ) 1 g tablet TAKE 1 TABLET BY MOUTH 2 TIMES DAILY. 08/29/23   Mansouraty, Aloha Raddle., MD  traMADol  (ULTRAM ) 50 MG tablet Take 1 tablet (50 mg total) by mouth 2 (two) times daily as needed. 05/06/23   Jule Ronal CROME, PA-C    Allergies: Patient has no known allergies.    Review of Systems  Updated Vital Signs BP 127/80 (BP Location: Left Arm)   Pulse 67   Temp 98.2 F (36.8 C) (Oral)   Resp 18   Ht 5' 6 (1.676 m)   Wt 77 kg   LMP 12/27/2010   SpO2 95%   BMI 27.40 kg/m   Physical Exam Vitals and nursing note reviewed.  Constitutional:      General: She is not in acute distress.    Appearance: She is well-developed. She is not ill-appearing.  Pulmonary:     Effort: Pulmonary effort is normal.   Musculoskeletal:        General: Normal range of motion.     Cervical back: Normal range of motion.     Comments: Right 4th finger is swollen circumferentially with changes of ecchymosis over the PIP dorsal joint. Tender to dorsal and volar aspects. No skin breakdown or wound. No redness or tenderness extending into the hand.    Skin:    General: Skin is warm and dry.   Neurological:     Mental Status: She is alert and oriented to person, place, and time.     (all labs ordered are listed, but only abnormal results are displayed) Labs Reviewed - No data to display  EKG: None  Radiology: DG Finger Ring Right Result Date: 10/05/2023 CLINICAL  DATA:  Pain and swelling EXAM: RIGHT RING FINGER 2+V COMPARISON:  None Available. FINDINGS: No signs acute fracture or subluxation. The bones appear osteopenic. Focal periarticular erosion noted at the volar base of the distal phalanx. No soft tissue calcifications. IMPRESSION: 1. No fracture or dislocation. 2. Focal periarticular erosion at the volar base of the distal phalanx. Correlate for any clinical signs or symptoms of inflammatory arthropathy including crystalline deposition disease. Electronically Signed   By: Waddell Calk M.D.   On: 10/05/2023 09:19  Procedures   Medications Ordered in the ED  oxyCODONE-acetaminophen (PERCOCET/ROXICET) 5-325 MG per tablet 1 tablet (1 tablet Oral Given 10/05/23 0902)  cephALEXin (KEFLEX) capsule 500 mg (500 mg Oral Given 10/05/23 1009)    Clinical Course as of 10/05/23 1029  Sun Oct 05, 2023  0929 Finger xray per radiology: IMPRESSION: 1. No fracture or dislocation. 2. Focal periarticular erosion at the volar base of the distal phalanx. Correlate for any clinical signs or symptoms of inflammatory arthropathy including crystalline deposition disease.  [SU]  1027 Patient with finger pain and swelling x 1 day. Exam concerning for early infection. No evidence to suggest tenosynovitis. Symptoms focused on PIP joint with extending distally - xray findings do not clinically correlate. Patient started on Keflex, provided pain management and referred to [facility]) for close follow up.  The patient was seen and evaluated by Dr. Mannie.  [SU]    Clinical Course User Index [SU] Odell Balls, PA-C                                 Medical Decision Making Amount and/or Complexity of Data Reviewed Radiology: ordered.  Risk Prescription drug management.        Final diagnoses:  Finger infection    ED Discharge Orders          Ordered    cephALEXin (KEFLEX) 500 MG capsule  4 times daily,   Status:  Discontinued        10/05/23  0958    oxyCODONE-acetaminophen (PERCOCET/ROXICET) 5-325 MG tablet  Every 6 hours PRN        10/05/23 0958    ibuprofen (ADVIL) 600 MG tablet  Every 6 hours PRN        10/05/23 0958    cephALEXin (KEFLEX) 500 MG capsule  4 times daily        10/05/23 1001               Odell Balls, PA-C 10/05/23 1029    Mannie Pac T, DO 10/06/23 [phone]

## 2023-10-20 ENCOUNTER — Telehealth: Payer: Self-pay | Admitting: Gastroenterology

## 2023-10-20 NOTE — Telephone Encounter (Signed)
 Patient requesting f/u call to discuss prep. Please advise.

## 2023-10-20 NOTE — Telephone Encounter (Signed)
 Reviewed instructions with patient, she verbalized understanding and agreed with the plan

## 2023-10-21 ENCOUNTER — Encounter: Payer: Self-pay | Admitting: Gastroenterology

## 2023-10-21 ENCOUNTER — Ambulatory Visit: Admitting: Gastroenterology

## 2023-10-21 ENCOUNTER — Other Ambulatory Visit: Payer: Self-pay

## 2023-10-21 VITALS — BP 107/62 | HR 54 | Temp 98.1°F | Resp 12 | Ht 66.0 in | Wt 168.0 lb

## 2023-10-21 DIAGNOSIS — K2950 Unspecified chronic gastritis without bleeding: Secondary | ICD-10-CM | POA: Diagnosis not present

## 2023-10-21 DIAGNOSIS — K50819 Crohn's disease of both small and large intestine with unspecified complications: Secondary | ICD-10-CM

## 2023-10-21 DIAGNOSIS — K449 Diaphragmatic hernia without obstruction or gangrene: Secondary | ICD-10-CM

## 2023-10-21 DIAGNOSIS — K219 Gastro-esophageal reflux disease without esophagitis: Secondary | ICD-10-CM

## 2023-10-21 DIAGNOSIS — Z1211 Encounter for screening for malignant neoplasm of colon: Secondary | ICD-10-CM

## 2023-10-21 MED ORDER — SODIUM CHLORIDE 0.9 % IV SOLN: 500.0000 mL | INTRAVENOUS | Status: DC

## 2023-10-21 NOTE — Op Note (Signed)
 [facility] Patient Name: Lindsey Massey Procedure Date: 10/21/2023 1:18 PM MRN: [id number] Endoscopist: Aloha Finner , MD, [id number] Age: 63 Referring MD:  Date of Birth: 01/04/1961 Gender: Female Account #: [id number] Procedure:                Upper GI endoscopy Indications:              Dysphagia, Heartburn Medicines:                Monitored Anesthesia Care Procedure:                Pre-Anesthesia Assessment:                           - Prior to the procedure, a History and Physical                            was performed, and patient medications and                            allergies were reviewed. The patient's tolerance of                            previous anesthesia was also reviewed. The risks                            and benefits of the procedure and the sedation                            options and risks were discussed with the patient.                            All questions were answered, and informed consent                            was obtained. Prior Anticoagulants: The patient has                            taken no anticoagulant or antiplatelet agents. ASA                            Grade Assessment: II - A patient with mild systemic                            disease. After reviewing the risks and benefits,                            the patient was deemed in satisfactory condition to                            undergo the procedure.                           After obtaining informed consent, the endoscope was  passed under direct vision. Throughout the                            procedure, the patient's blood pressure, pulse, and                            oxygen saturations were monitored continuously. The                            GIF [id number] #[id number] was introduced through the                            mouth, and advanced to the second part of duodenum.                            The upper GI endoscopy was  accomplished without                            difficulty. The patient tolerated the procedure. Scope In: Scope Out: Findings:                 No gross lesions were noted in the entire                            esophagus. Biopsies were taken with a cold forceps                            for histology. A guidewire was placed and the scope                            was withdrawn. Dilation was performed with a Savary                            dilator with moderate resistance at 18 mm. The                            dilation site was examined following endoscope                            reinsertion and showed no change.                           A 1 cm hiatal hernia was present.                           Striped mildly erythematous mucosa without bleeding                            was found in the gastric antrum.                           No other gross lesions were noted in the entire  examined stomach. Biopsies were taken with a cold                            forceps for histology and Helicobacter pylori                            testing.                           No gross lesions were noted in the duodenal bulb,                            in the first portion of the duodenum and in the                            second portion of the duodenum. Complications:            No immediate complications. Estimated Blood Loss:     Estimated blood loss was minimal. Impression:               - No gross lesions in the entire esophagus.                            Biopsied. Dilated.                           - 1 cm hiatal hernia.                           - Erythematous mucosa in the antrum. No other gross                            lesions in the entire stomach. Biopsied.                           - No gross lesions in the duodenal bulb, in the                            first portion of the duodenum and in the second                            portion of the  duodenum. Recommendation:           - The patient will be observed post-procedure,                            until all discharge criteria are met.                           - Discharge patient to home.                           - Patient has a contact number available for                            emergencies. The  signs and symptoms of potential                            delayed complications were discussed with the                            patient. Return to normal activities tomorrow.                            Written discharge instructions were provided to the                            patient.                           - Dilation diet as per protocol.                           - Continue present medications.                           - Await pathology results.                           - If patient continues to have issues with                            dysphagia, esophageal manometry will be                            recommended, if no evidence of EOE.                           - The findings and recommendations were discussed                            with the patient.                           - The findings and recommendations were discussed                            with the designated responsible adult. Aloha Finner, MD 10/21/2023 1:41:40 PM

## 2023-10-21 NOTE — Progress Notes (Signed)
 GASTROENTEROLOGY PROCEDURE H&P NOTE   Primary Care Physician: Purcell Emil Schanz, MD  HPI: Lindsey Massey is a 63 y.o. female who presents for EGD for evaluation of dysphagia.  Past Medical History:  Diagnosis Date   Asthma    Cataract    Crohn disease (HCC)    Depressed    GERD (gastroesophageal reflux disease)    High cholesterol    Hx of tonsillectomy    Hypercholesteremia    Hypertension    Osteoporosis    Shortness of breath    sometimes before sleep   Uterine fibroid    Past Surgical History:  Procedure Laterality Date   COLONOSCOPY  04/2023   DILATION AND CURRETTAGE     LASIK     [place] and then [place]    TONSILLECTOMY     age 52   TONSILLECTOMY AND ADENOIDECTOMY     Current Outpatient Medications  Medication Sig Dispense Refill   adalimumab  (HUMIRA ) 80 MG/0.8ML pen Inject 0.8 mLs (80 mg total) into the skin every 14 (fourteen) days. Inject 1 pen on day 29 then every 14 days inject 1 pen 9.6 mL 0   adalimumab  (HUMIRA , 2 PEN,) 80 MG/0.8ML pen Inject 1 pen on day 29 and then every 2 weeks (Patient not taking: Reported on 08/12/2023) 2 each 6   adalimumab  (HUMIRA -CD/UC/HS STARTER) 80 MG/0.8ML pen Inject 160 mg day 1, inject 80 mg day 15 2.4 mL 0   ALPRAZolam  (XANAX ) 0.5 MG tablet TAKE 1 TABLET BY MOUTH EVERY DAY AS NEEDED FOR ANXIETY 30 tablet 1   aspirin  EC 81 MG tablet Take 1 tablet (81 mg total) by mouth daily. Swallow whole. 30 tablet 0   azaTHIOprine  (IMURAN ) 50 MG tablet TAKE 1 TABLET BY MOUTH DAILY 90 tablet 1   calcium  carbonate (CALCIUM  ANTACID) 500 MG chewable tablet Chew 1 tablet (200 mg of elemental calcium  total) by mouth daily. (Patient not taking: Reported on 09/30/2023) 30 tablet 0   candesartan  (ATACAND ) 8 MG tablet Take 1 tablet (8 mg total) by mouth daily. 180 tablet 3   cephALEXin  (KEFLEX ) 500 MG capsule Take 1 capsule (500 mg total) by mouth 4 (four) times daily. 28 capsule 0   Cholecalciferol  (VITAMIN D3) 125  MCG (5000 UT) CAPS Take 1 capsule (5,000 Units total) by mouth daily. 30 capsule 0   Cyanocobalamin (VITAMIN B 12 PO) Take by mouth.     esomeprazole  (NEXIUM ) 40 MG capsule TAKE 1 CAPSULE BY MOUTH DAILY 90 capsule 1   fluticasone (FLONASE) 50 MCG/ACT nasal spray Place 1 spray into both nostrils 2 (two) times daily.     ibandronate  (BONIVA ) 150 MG tablet Take 1 tablet (150 mg total) by mouth every 30 (thirty) days. Take in the morning with a full glass of water, on an empty stomach, and do not take anything else by mouth or lie down for the next 30 min. 6 tablet 0   ibuprofen  (ADVIL ) 600 MG tablet Take 1 tablet (600 mg total) by mouth every 6 (six) hours as needed. 30 tablet 0   Magnesium  250 MG CAPS Take 250 mg by mouth daily. Takes 500 30 capsule 1   methocarbamol  (ROBAXIN -750) 750 MG tablet Take 1 tablet (750 mg total) by mouth 2 (two) times daily as needed for muscle spasms. 20 tablet 1   mirtazapine  (REMERON ) 15 MG tablet Take 1 tablet (15 mg total) by mouth at bedtime. 180 tablet 1   mupirocin  ointment (BACTROBAN ) 2 %  Apply 1 Application topically 2 (two) times daily. 22 g 1   ofloxacin (OCUFLOX) 0.3 % ophthalmic solution PLEASE SEE ATTACHED FOR DETAILED DIRECTIONS     oxyCODONE -acetaminophen  (PERCOCET/ROXICET) 5-325 MG tablet Take 1 tablet by mouth every 6 (six) hours as needed for severe pain (pain score 7-10). 10 tablet 0   prednisoLONE  acetate (PRED FORTE ) 1 % ophthalmic suspension Place 1 drop into the left eye 4 (four) times daily. 5 mL 1   predniSONE  (DELTASONE ) 10 MG tablet Take by mouth.     predniSONE  (STERAPRED UNI-PAK 21 TAB) 10 MG (21) TBPK tablet Take by mouth daily. Take 6 tabs by mouth daily  for 2 days, then 5 tabs for 2 days, then 4 tabs for 2 days, then 3 tabs for 2 days, 2 tabs for 2 days, then 1 tab by mouth daily for 2 days 42 tablet 0   PREMARIN  vaginal cream PLACE ONE-HALF APPLICATORFUL  VAGINALLY 3 TIMES WEEKLY 42.5 g 3   sertraline  (ZOLOFT ) 50 MG tablet Take 1 tablet  (50 mg total) by mouth daily. 90 tablet 1   simvastatin  (ZOCOR ) 20 MG tablet TAKE 1 TABLET BY MOUTH EVERY DAY 180 tablet 1   sucralfate  (CARAFATE ) 1 g tablet TAKE 1 TABLET BY MOUTH 2 TIMES DAILY. 60 tablet 1   traMADol  (ULTRAM ) 50 MG tablet Take 1 tablet (50 mg total) by mouth 2 (two) times daily as needed. 30 tablet 0   Current Facility-Administered Medications  Medication Dose Route Frequency Provider Last Rate Last Admin   denosumab  (PROLIA ) injection 60 mg  60 mg Subcutaneous Once         Current Outpatient Medications:    adalimumab  (HUMIRA ) 80 MG/0.8ML pen, Inject 0.8 mLs (80 mg total) into the skin every 14 (fourteen) days. Inject 1 pen on day 29 then every 14 days inject 1 pen, Disp: 9.6 mL, Rfl: 0   adalimumab  (HUMIRA , 2 PEN,) 80 MG/0.8ML pen, Inject 1 pen on day 29 and then every 2 weeks (Patient not taking: Reported on 08/12/2023), Disp: 2 each, Rfl: 6   adalimumab  (HUMIRA -CD/UC/HS STARTER) 80 MG/0.8ML pen, Inject 160 mg day 1, inject 80 mg day 15, Disp: 2.4 mL, Rfl: 0   ALPRAZolam  (XANAX ) 0.5 MG tablet, TAKE 1 TABLET BY MOUTH EVERY DAY AS NEEDED FOR ANXIETY, Disp: 30 tablet, Rfl: 1   aspirin  EC 81 MG tablet, Take 1 tablet (81 mg total) by mouth daily. Swallow whole., Disp: 30 tablet, Rfl: 0   azaTHIOprine  (IMURAN ) 50 MG tablet, TAKE 1 TABLET BY MOUTH DAILY, Disp: 90 tablet, Rfl: 1   calcium  carbonate (CALCIUM  ANTACID) 500 MG chewable tablet, Chew 1 tablet (200 mg of elemental calcium  total) by mouth daily. (Patient not taking: Reported on 09/30/2023), Disp: 30 tablet, Rfl: 0   candesartan  (ATACAND ) 8 MG tablet, Take 1 tablet (8 mg total) by mouth daily., Disp: 180 tablet, Rfl: 3   cephALEXin  (KEFLEX ) 500 MG capsule, Take 1 capsule (500 mg total) by mouth 4 (four) times daily., Disp: 28 capsule, Rfl: 0   Cholecalciferol  (VITAMIN D3) 125 MCG (5000 UT) CAPS, Take 1 capsule (5,000 Units total) by mouth daily., Disp: 30 capsule, Rfl: 0   Cyanocobalamin (VITAMIN B 12 PO), Take by mouth.,  Disp: , Rfl:    esomeprazole  (NEXIUM ) 40 MG capsule, TAKE 1 CAPSULE BY MOUTH DAILY, Disp: 90 capsule, Rfl: 1   fluticasone (FLONASE) 50 MCG/ACT nasal spray, Place 1 spray into both nostrils 2 (two) times daily., Disp: , Rfl:  ibandronate  (BONIVA ) 150 MG tablet, Take 1 tablet (150 mg total) by mouth every 30 (thirty) days. Take in the morning with a full glass of water, on an empty stomach, and do not take anything else by mouth or lie down for the next 30 min., Disp: 6 tablet, Rfl: 0   ibuprofen  (ADVIL ) 600 MG tablet, Take 1 tablet (600 mg total) by mouth every 6 (six) hours as needed., Disp: 30 tablet, Rfl: 0   Magnesium  250 MG CAPS, Take 250 mg by mouth daily. Takes 500, Disp: 30 capsule, Rfl: 1   methocarbamol  (ROBAXIN -750) 750 MG tablet, Take 1 tablet (750 mg total) by mouth 2 (two) times daily as needed for muscle spasms., Disp: 20 tablet, Rfl: 1   mirtazapine  (REMERON ) 15 MG tablet, Take 1 tablet (15 mg total) by mouth at bedtime., Disp: 180 tablet, Rfl: 1   mupirocin  ointment (BACTROBAN ) 2 %, Apply 1 Application topically 2 (two) times daily., Disp: 22 g, Rfl: 1   ofloxacin (OCUFLOX) 0.3 % ophthalmic solution, PLEASE SEE ATTACHED FOR DETAILED DIRECTIONS, Disp: , Rfl:    oxyCODONE -acetaminophen  (PERCOCET/ROXICET) 5-325 MG tablet, Take 1 tablet by mouth every 6 (six) hours as needed for severe pain (pain score 7-10)., Disp: 10 tablet, Rfl: 0   prednisoLONE  acetate (PRED FORTE ) 1 % ophthalmic suspension, Place 1 drop into the left eye 4 (four) times daily., Disp: 5 mL, Rfl: 1   predniSONE  (DELTASONE ) 10 MG tablet, Take by mouth., Disp: , Rfl:    predniSONE  (STERAPRED UNI-PAK 21 TAB) 10 MG (21) TBPK tablet, Take by mouth daily. Take 6 tabs by mouth daily  for 2 days, then 5 tabs for 2 days, then 4 tabs for 2 days, then 3 tabs for 2 days, 2 tabs for 2 days, then 1 tab by mouth daily for 2 days, Disp: 42 tablet, Rfl: 0   PREMARIN  vaginal cream, PLACE ONE-HALF APPLICATORFUL  VAGINALLY 3 TIMES WEEKLY,  Disp: 42.5 g, Rfl: 3   sertraline  (ZOLOFT ) 50 MG tablet, Take 1 tablet (50 mg total) by mouth daily., Disp: 90 tablet, Rfl: 1   simvastatin  (ZOCOR ) 20 MG tablet, TAKE 1 TABLET BY MOUTH EVERY DAY, Disp: 180 tablet, Rfl: 1   sucralfate  (CARAFATE ) 1 g tablet, TAKE 1 TABLET BY MOUTH 2 TIMES DAILY., Disp: 60 tablet, Rfl: 1   traMADol  (ULTRAM ) 50 MG tablet, Take 1 tablet (50 mg total) by mouth 2 (two) times daily as needed., Disp: 30 tablet, Rfl: 0  Current Facility-Administered Medications:    denosumab  (PROLIA ) injection 60 mg, 60 mg, Subcutaneous, Once,  No Known Allergies Family History  Problem Relation Age of Onset   Arthritis Mother    Hypertension Mother    Heart disease Mother    Cancer Father        liver   Hypertension Father    Arthritis Sister    Hypertension Brother    Multiple sclerosis Brother    Heart disease Paternal Uncle    Anesthesia problems Neg Hx    Colon cancer Neg Hx    Esophageal cancer Neg Hx    Inflammatory bowel disease Neg Hx    Liver disease Neg Hx    Pancreatic cancer Neg Hx    Rectal cancer Neg Hx    Stomach cancer Neg Hx    Colon polyps Neg Hx    Social History   Socioeconomic History   Marital status: Divorced    Spouse name: Not on file   Number of children: Not on file  Years of education: Not on file   Highest education level: 12th grade  Occupational History   Not on file  Tobacco Use   Smoking status: Every Day    Current packs/day: 5.00    Average packs/day: 5.0 packs/day for 11.5 years (57.7 ttl pk-yrs)    Types: Cigarettes    Start date: 2014   Smokeless tobacco: Not on file  Vaping Use   Vaping status: Never Used  Substance and Sexual Activity   Alcohol use: No   Drug use: No   Sexual activity: Not Currently    Partners: Male    Birth control/protection: Post-menopausal    Comment: menarche 63yo, sexual debut 63yo  Other Topics Concern   Not on file  Social History Narrative   Lives with sister and nephews (18,16) in  [place]. Moved from [place]  in April 2012. Separated from husband. No children. Does not work.      Balanced diet. Little exercise.      Christian faith.   Social Drivers of Corporate investment banker Strain: High Risk (04/30/2023)   Overall Financial Resource Strain (CARDIA)    Difficulty of Paying Living Expenses: Very hard  Food Insecurity: Food Insecurity Present (04/30/2023)   Hunger Vital Sign    Worried About Running Out of Food in the Last Year: Often true    Ran Out of Food in the Last Year: Sometimes true  Transportation Needs: Unmet Transportation Needs (04/30/2023)   PRAPARE - Administrator, Civil Service (Medical): Yes    Lack of Transportation (Non-Medical): Yes  Physical Activity: Insufficiently Active (04/30/2023)   Exercise Vital Sign    Days of Exercise per Week: 2 days    Minutes of Exercise per Session: 10 min  Stress: Stress Concern Present (04/30/2023)   [employer] of Occupational Health - Occupational Stress Questionnaire    Feeling of Stress : Very much  Social Connections: Unknown (04/30/2023)   Social Connection and Isolation Panel    Frequency of Communication with Friends and Family: Twice a week    Frequency of Social Gatherings with Friends and Family: Patient declined    Attends Religious Services: Never    Database administrator or Organizations: No    Attends Engineer, structural: Not on file    Marital Status: Living with partner  Recent Concern: Social Connections - Moderately Isolated (03/10/2023)   Social Connection and Isolation Panel    Frequency of Communication with Friends and Family: More than three times a week    Frequency of Social Gatherings with Friends and Family: More than three times a week    Attends Religious Services: More than 4 times per year    Active Member of [employer] or Organizations: No    Attends Engineer, structural: Not on file    Marital Status: Divorced  Intimate Partner  Violence: Not on file    Physical Exam: There were no vitals filed for this visit. There is no height or weight on file to calculate BMI. GEN: NAD EYE: Sclerae anicteric ENT: MMM CV: Non-tachycardic GI: Soft, NT/ND NEURO:  Alert & Oriented x 3  Lab Results: No results for input(s): WBC, HGB, HCT, PLT in the last 72 hours. BMET No results for input(s): NA, K, CL, CO2, GLUCOSE, BUN, CREATININE, CALCIUM  in the last 72 hours. LFT No results for input(s): PROT, ALBUMIN, AST, ALT, ALKPHOS, BILITOT, BILIDIR, IBILI in the last 72 hours. PT/INR No results for input(s): LABPROT, INR in  the last 72 hours.   Impression / Plan: This is a 63 y.o.female who presents for EGD for evaluation of dysphagia.  The risks and benefits of endoscopic evaluation/treatment were discussed with the patient and/or family; these include but are not limited to the risk of perforation, infection, bleeding, missed lesions, lack of diagnosis, severe illness requiring hospitalization, as well as anesthesia and sedation related illnesses.  The patient's history has been reviewed, patient examined, no change in status, and deemed stable for procedure.  The patient and/or family is agreeable to proceed.    Aloha Finner, MD [facility] Advanced Endoscopy Office # [phone]

## 2023-10-21 NOTE — Progress Notes (Signed)
 Report to PACU, RN, vss, BBS= Clear.

## 2023-10-21 NOTE — Patient Instructions (Signed)
YOU HAD AN ENDOSCOPIC PROCEDURE TODAY AT THE  ENDOSCOPY CENTER:   Refer to the procedure report that was given to you for any specific questions about what was found during the examination.  If the procedure report does not answer your questions, please call your gastroenterologist to clarify.  If you requested that your care partner not be given the details of your procedure findings, then the procedure report has been included in a sealed envelope for you to review at your convenience later.  YOU SHOULD EXPECT: Some feelings of bloating in the abdomen. Passage of more gas than usual.  Walking can help get rid of the air that was put into your GI tract during the procedure and reduce the bloating. If you had a lower endoscopy (such as a colonoscopy or flexible sigmoidoscopy) you may notice spotting of blood in your stool or on the toilet paper. If you underwent a bowel prep for your procedure, you may not have a normal bowel movement for a few days.  Please Note:  You might notice some irritation and congestion in your nose or some drainage.  This is from the oxygen used during your procedure.  There is no need for concern and it should clear up in a day or so.  SYMPTOMS TO REPORT IMMEDIATELY:  Following upper endoscopy (EGD)  Vomiting of blood or coffee ground material  New chest pain or pain under the shoulder blades  Painful or persistently difficult swallowing  New shortness of breath  Fever of 100F or higher  Black, tarry-looking stools  For urgent or emergent issues, a gastroenterologist can be reached at any hour by calling [phone]. Do not use MyChart messaging for urgent concerns.    DIET:  Dilation diet today (see handout), but then you may proceed to your regular diet tomorrow.  Drink plenty of fluids but you should avoid alcoholic beverages for 24 hours.  ACTIVITY:  You should plan to take it easy for the rest of today and you should NOT DRIVE or use heavy machinery  until tomorrow (because of the sedation medicines used during the test).    FOLLOW UP: Our staff will call the number listed on your records the next business day following your procedure.  We will call around 7:15- 8:00 am to check on you and address any questions or concerns that you may have regarding the information given to you following your procedure. If we do not reach you, we will leave a message.     If any biopsies were taken you will be contacted by phone or by letter within the next 1-3 weeks.  Please call us at [phone] if you have not heard about the biopsies in 3 weeks.    SIGNATURES/CONFIDENTIALITY: You and/or your care partner have signed paperwork which will be entered into your electronic medical record.  These signatures attest to the fact that that the information above on your After Visit Summary has been reviewed and is understood.  Full responsibility of the confidentiality of this discharge information lies with you and/or your care-partner. 

## 2023-10-21 NOTE — Progress Notes (Signed)
 Called to room to assist during endoscopic procedure.  Patient ID and intended procedure confirmed with present staff. Received instructions for my participation in the procedure from the performing physician.

## 2023-10-22 ENCOUNTER — Telehealth: Payer: Self-pay | Admitting: Lactation Services

## 2023-10-22 NOTE — Telephone Encounter (Signed)
  Follow up Call-     10/21/2023   12:17 PM 04/18/2023   12:50 PM  Call back number  Post procedure Call Back phone  # [phone]  Permission to leave phone message Yes Yes     Patient questions:  Do you have a fever, pain , or abdominal swelling? No. Pain Score  0 *  Have you tolerated food without any problems? Yes.    Have you been able to return to your normal activities? Yes.    Do you have any questions about your discharge instructions: Diet   No. Medications  Yes.   Follow up visit  No.  Do you have questions or concerns about your Care? No.  Actions: * If pain score is 4 or above: No action needed, pain <4.

## 2023-10-24 ENCOUNTER — Telehealth: Payer: Self-pay

## 2023-10-24 ENCOUNTER — Other Ambulatory Visit (HOSPITAL_COMMUNITY): Payer: Self-pay

## 2023-10-24 ENCOUNTER — Other Ambulatory Visit: Payer: Self-pay

## 2023-10-24 DIAGNOSIS — K50819 Crohn's disease of both small and large intestine with unspecified complications: Secondary | ICD-10-CM

## 2023-10-24 LAB — SURGICAL PATHOLOGY

## 2023-10-24 MED ORDER — HUMIRA-CD/UC/HS STARTER 80 MG/0.8ML ~~LOC~~ AJKT
AUTO-INJECTOR | SUBCUTANEOUS | 0 refills | Status: AC
  Filled 2023-10-27 – 2023-10-28 (×2): qty 2.4, 28d supply, fill #0

## 2023-10-24 MED ORDER — HUMIRA (2 PEN) 40 MG/0.4ML ~~LOC~~ AJKT
40.0000 mg | AUTO-INJECTOR | SUBCUTANEOUS | 6 refills | Status: DC
  Filled 2023-10-27 – 2023-12-10 (×6): qty 2, 28d supply, fill #0
  Filled 2023-12-25 – 2024-01-05 (×4): qty 2, 28d supply, fill #1
  Filled 2024-01-29 (×2): qty 2, 28d supply, fill #2

## 2023-10-24 NOTE — Telephone Encounter (Signed)
 Prescriptions have been sent to the pharmacy Labs orders have been entered The pt has been advised via My Chart- I also left a message for the pt to call our office to discuss using interpreter service.

## 2023-10-24 NOTE — Telephone Encounter (Signed)
-----   Message from [facility] sent at 10/24/2023  5:40 AM EDT ----- Regarding: RE: Humira  Issues Ryota Treece, Thank you for this update. Please make this a telephone message. When you have an opportunity, can we reach out to the patient (probably best to use interpreter services)? She will need to be induced from the beginning as she did not do more than the initial injections.   She will need the following plan: She needs updated labs including a CBC/CMP/ESR/CRP. She needs a fecal calprotectin to be drawn. Once these have been given, then plan will be 160 mg on day 0 80 mg on day 14 40 mg on day 28 and every 2 weeks thereafter. Thanks. GM ----- Message ----- From: Anitra Odetta CROME, RN Sent: 10/21/2023   1:47 PM EDT To: Aloha Wilhelmenia Raddle., MD Subject: RE: Humira  Issues                              Looks like she has not requested refills from [facility].  There is a script with refills available on hold. ----- Message ----- From: Wilhelmenia Aloha Raddle., MD Sent: 10/21/2023   1:39 PM EDT To: Odetta CROME Anitra, RN Subject: Humira  Issues                                  Lama Narayanan, This patient states that she got 1 dose of Humira  2-3 months ago and has not received any other doses since then. Can you please follow-up and find out what has happened? Thanks. GM

## 2023-10-27 ENCOUNTER — Other Ambulatory Visit (INDEPENDENT_AMBULATORY_CARE_PROVIDER_SITE_OTHER)

## 2023-10-27 ENCOUNTER — Telehealth: Payer: Self-pay | Admitting: Gastroenterology

## 2023-10-27 ENCOUNTER — Other Ambulatory Visit: Payer: Self-pay

## 2023-10-27 DIAGNOSIS — K50919 Crohn's disease, unspecified, with unspecified complications: Secondary | ICD-10-CM

## 2023-10-27 DIAGNOSIS — K50819 Crohn's disease of both small and large intestine with unspecified complications: Secondary | ICD-10-CM | POA: Diagnosis not present

## 2023-10-27 LAB — CBC WITH DIFFERENTIAL/PLATELET
Basophils Absolute: 0 K/uL (ref 0.0–0.1)
Basophils Relative: 0.7 % (ref 0.0–3.0)
Eosinophils Absolute: 0 K/uL (ref 0.0–0.7)
Eosinophils Relative: 1.4 % (ref 0.0–5.0)
HCT: 40.7 % (ref 36.0–46.0)
Hemoglobin: 13.6 g/dL (ref 12.0–15.0)
Lymphocytes Relative: 16.6 % (ref 12.0–46.0)
Lymphs Abs: 0.6 K/uL — ABNORMAL LOW (ref 0.7–4.0)
MCHC: 33.3 g/dL (ref 30.0–36.0)
MCV: 90.1 fl (ref 78.0–100.0)
Monocytes Absolute: 0.3 K/uL (ref 0.1–1.0)
Monocytes Relative: 9.5 % (ref 3.0–12.0)
Neutro Abs: 2.5 K/uL (ref 1.4–7.7)
Neutrophils Relative %: 71.8 % (ref 43.0–77.0)
Platelets: 162 K/uL (ref 150.0–400.0)
RBC: 4.52 Mil/uL (ref 3.87–5.11)
RDW: 15.1 % (ref 11.5–15.5)
WBC: 3.5 K/uL — ABNORMAL LOW (ref 4.0–10.5)

## 2023-10-27 LAB — COMPREHENSIVE METABOLIC PANEL WITH GFR
ALT: 11 U/L (ref 0–35)
AST: 13 U/L (ref 0–37)
Albumin: 4.2 g/dL (ref 3.5–5.2)
Alkaline Phosphatase: 93 U/L (ref 39–117)
BUN: 8 mg/dL (ref 6–23)
CO2: 29 meq/L (ref 19–32)
Calcium: 9.8 mg/dL (ref 8.4–10.5)
Chloride: 104 meq/L (ref 96–112)
Creatinine, Ser: 0.51 mg/dL (ref 0.40–1.20)
GFR: 100.11 mL/min (ref >60.00)
Glucose, Bld: 98 mg/dL (ref 70–99)
Potassium: 4.2 meq/L (ref 3.5–5.1)
Sodium: 141 meq/L (ref 135–145)
Total Bilirubin: 0.4 mg/dL (ref 0.2–1.2)
Total Protein: 6.6 g/dL (ref 6.0–8.3)

## 2023-10-27 LAB — HIGH SENSITIVITY CRP: CRP, High Sensitivity: 6.8 mg/L — ABNORMAL HIGH (ref 0.000–5.000)

## 2023-10-27 LAB — SEDIMENTATION RATE: Sed Rate: 42 mm/h — ABNORMAL HIGH (ref 0–30)

## 2023-10-27 NOTE — Telephone Encounter (Signed)
 Holly with [facility] is calling to find out if they can route the Humira  injection to our office due to the patient still needing training. Please advise. [phone]

## 2023-10-27 NOTE — Progress Notes (Addendum)
 Specialty Pharmacy Initiation Note   I spoke with the patient via interpreter # [phone]. Lindsey Massey is a 63 y.o. female who will be followed by the specialty pharmacy service for RxSp Crohn's Disease    Review of administration, indication, effectiveness, safety, potential side effects, storage/disposable, and missed dose instructions occurred today for patient's specialty medication(s) Adalimumab  (Humira -CD/UC/HS Starter, Humira  (2 Pen))     Patient/Caregiver did not have any additional questions or concerns.   Patient's therapy is appropriate to: Initiate    Goals Addressed             This Visit's Progress    Minimize recurrence of flares       Patient is initiating therapy. Patient will maintain adherence         Silvano LOISE Dolly Specialty Pharmacist

## 2023-10-27 NOTE — Progress Notes (Signed)
 Specialty Pharmacy Initial Fill Coordination Note  Lindsey Massey is a 63 y.o. female contacted today regarding initial fill of specialty medication(s) Adalimumab  (Humira -CD/UC/HS Starter, Humira  (2 Pen))   Patient requested Delivery   Delivery date: 10/29/23   Verified address: [address] [place] [address]   Medication will be filled on 10-28-2023.   Patient is aware of $4.00 copayment.

## 2023-10-28 ENCOUNTER — Ambulatory Visit: Payer: Medicaid Other | Admitting: Emergency Medicine

## 2023-10-28 ENCOUNTER — Other Ambulatory Visit: Payer: Self-pay

## 2023-10-28 ENCOUNTER — Other Ambulatory Visit: Payer: Self-pay | Admitting: Gastroenterology

## 2023-10-28 ENCOUNTER — Other Ambulatory Visit (HOSPITAL_COMMUNITY): Payer: Self-pay

## 2023-10-28 NOTE — Telephone Encounter (Signed)
 Advised by nurse to have patient contact [employer]  in regards to their ambassador program. No further questions.

## 2023-10-28 NOTE — Progress Notes (Addendum)
 According to the RN at [facility], the patient will not receive in-office training. Instead, she will be trained through the [facility]  Nurse [facility]. The patient was contacted and provided with the program's contact information.

## 2023-10-28 NOTE — Progress Notes (Signed)
 Left voicemail for the patient to inform her that her delivery was delayed by one day and is now expected to arrive on 7/24, due to the item being out of stock and requiring a special order.

## 2023-10-28 NOTE — Telephone Encounter (Signed)
 Spoke with the [facility] pharmacy and advised that the Ambassador program thru [employer]  will work with the pt and assist with questions and training.  1-800-4-[facility]  phone number provided. No further questions

## 2023-10-29 ENCOUNTER — Other Ambulatory Visit: Payer: Self-pay

## 2023-10-29 LAB — CALPROTECTIN, FECAL: Calprotectin, Fecal: 938 ug/g — ABNORMAL HIGH (ref 0–120)

## 2023-10-30 ENCOUNTER — Ambulatory Visit: Payer: Self-pay | Admitting: Gastroenterology

## 2023-11-14 ENCOUNTER — Other Ambulatory Visit: Payer: Self-pay

## 2023-11-19 ENCOUNTER — Other Ambulatory Visit: Payer: Self-pay

## 2023-11-19 ENCOUNTER — Other Ambulatory Visit: Payer: Self-pay | Admitting: Gastroenterology

## 2023-11-21 ENCOUNTER — Other Ambulatory Visit: Payer: Self-pay

## 2023-11-27 ENCOUNTER — Other Ambulatory Visit: Payer: Self-pay

## 2023-11-27 NOTE — Progress Notes (Signed)
 Clinical Intervention Note  Clinical Intervention Notes: During refill call attempt, patient disclosed to technician that she had not used some or all of her starter pack as of yet. Call was transferred to pharmacist to assist. Language barrier became an issue during this call and interpreter services were then included. Called patient back with interpreter ID [phone] to assist. Patient has not yet injected any of the medication in the starter pack because she does not recall how to use it. Counseled patient to inject the interior package marked dose 1 which contains 2 pens on day 1 and to inject the next package marked dose 2 which contains 1 pen 2 weeks after. Patient in understanding but still wishes for additional information about self injection technique. She had spoken with [employer]  Nurse Ambassador once in the past and would like to use this service again. Provided patient with their phone number and she will reach out before starting the injections. Advised that we will not refill medication at this time until she initiates starter dosing and will call her back in about 3 weeks to check in.   Clinical Intervention Outcomes: Improved therapy adherence   Delon CHRISTELLA Brow Specialty Pharmacist

## 2023-12-02 ENCOUNTER — Other Ambulatory Visit: Payer: Self-pay | Admitting: Gastroenterology

## 2023-12-03 ENCOUNTER — Other Ambulatory Visit (HOSPITAL_COMMUNITY): Payer: Self-pay

## 2023-12-10 ENCOUNTER — Other Ambulatory Visit: Payer: Self-pay

## 2023-12-10 NOTE — Progress Notes (Signed)
 Specialty Pharmacy Refill Coordination Note  Lindsey Massey is a 63 y.o. female contacted today regarding refills of specialty medication(s) Adalimumab  (Humira -CD/UC/HS Starter, Humira  (2 Pen))   Patient requested Delivery   Delivery date: 12/12/23   Verified address: [address] [place] [address]   Medication will be filled on 12/11/23.

## 2023-12-11 ENCOUNTER — Other Ambulatory Visit: Payer: Self-pay

## 2023-12-18 ENCOUNTER — Ambulatory Visit: Admitting: Gastroenterology

## 2023-12-25 ENCOUNTER — Other Ambulatory Visit: Payer: Self-pay

## 2023-12-31 ENCOUNTER — Other Ambulatory Visit: Payer: Self-pay | Admitting: Physician Assistant

## 2023-12-31 ENCOUNTER — Other Ambulatory Visit: Payer: Self-pay | Admitting: Gastroenterology

## 2023-12-31 ENCOUNTER — Other Ambulatory Visit: Payer: Self-pay

## 2023-12-31 ENCOUNTER — Other Ambulatory Visit (HOSPITAL_COMMUNITY): Payer: Self-pay

## 2023-12-31 ENCOUNTER — Other Ambulatory Visit: Payer: Self-pay | Admitting: Cardiology

## 2023-12-31 DIAGNOSIS — R079 Chest pain, unspecified: Secondary | ICD-10-CM

## 2023-12-31 DIAGNOSIS — K50819 Crohn's disease of both small and large intestine with unspecified complications: Secondary | ICD-10-CM

## 2023-12-31 DIAGNOSIS — M810 Age-related osteoporosis without current pathological fracture: Secondary | ICD-10-CM

## 2023-12-31 NOTE — Telephone Encounter (Signed)
 SEND TO PATIENTS PCP

## 2024-01-01 ENCOUNTER — Telehealth: Payer: Self-pay

## 2024-01-01 ENCOUNTER — Other Ambulatory Visit: Payer: Self-pay | Admitting: Emergency Medicine

## 2024-01-01 ENCOUNTER — Other Ambulatory Visit: Payer: Self-pay | Admitting: Cardiology

## 2024-01-01 DIAGNOSIS — M810 Age-related osteoporosis without current pathological fracture: Secondary | ICD-10-CM

## 2024-01-01 NOTE — Telephone Encounter (Unsigned)
 Copied from CRM [id number]. Topic: Clinical - Medication Refill >> Jan 01, 2024  3:19 PM [place] M wrote: Medication: sertraline  (ZOLOFT ) 50 MG tablet ;  ibandronate  (BONIVA ) 150 MG tablet  Has the patient contacted their pharmacy? Yes (Agent: If no, request that the patient contact the pharmacy for the refill. If patient does not wish to contact the pharmacy document the reason why and proceed with request.) (Agent: If yes, when and what did the pharmacy advise?)  This is the patient's preferred pharmacy:  [id number] [address] [facility] - [address] [place] [address] Phone: [phone] Fax: [phone]   Is this the correct pharmacy for this prescription? Yes If no, delete pharmacy and type the correct one.   Has the prescription been filled recently? Yes  Is the patient out of the medication? Yes  Has the patient been seen for an appointment in the last year OR does the patient have an upcoming appointment? Yes  Can we respond through MyChart? Yes  Agent: Please be advised that Rx refills may take up to 3 business days. We ask that you follow-up with your pharmacy.

## 2024-01-01 NOTE — Telephone Encounter (Signed)
 Copied from CRM [id number]. Topic: Clinical - Medication Prior Auth >> Jan 01, 2024  3:40 PM Shereese L wrote: Reason for CRM: patient is needing prior auth for medication candesartan  (ATACAND ) 8 MG tablet  Please send via fax to [phone] or called in  [phone]

## 2024-01-02 ENCOUNTER — Other Ambulatory Visit (HOSPITAL_COMMUNITY): Payer: Self-pay

## 2024-01-02 ENCOUNTER — Other Ambulatory Visit: Payer: Self-pay | Admitting: Emergency Medicine

## 2024-01-02 ENCOUNTER — Telehealth: Payer: Self-pay | Admitting: Radiology

## 2024-01-02 MED ORDER — VALSARTAN 160 MG PO TABS: 160.0000 mg | ORAL_TABLET | Freq: Every day | ORAL | 3 refills | Status: DC

## 2024-01-02 NOTE — Telephone Encounter (Signed)
 New prescription for valsartan  160 mg sent to pharmacy of record today.

## 2024-01-02 NOTE — Telephone Encounter (Signed)
 Copied from CRM #[id number]. Topic: Clinical - Medication Question >> Jan 02, 2024 11:03 AM Tinnie BROCKS wrote: Reason for CRM: Pt calling in to request that we send in Valsartan  instead of getting prior auth for Candesartan  because the pharmacy states it is not covered by insurance: [facility] - [place], [place] - [address] [place] [address] Phone: [phone] Fax: [phone]

## 2024-01-05 ENCOUNTER — Other Ambulatory Visit (HOSPITAL_COMMUNITY): Payer: Self-pay

## 2024-01-05 ENCOUNTER — Telehealth

## 2024-01-05 ENCOUNTER — Other Ambulatory Visit: Payer: Self-pay

## 2024-01-05 DIAGNOSIS — M549 Dorsalgia, unspecified: Secondary | ICD-10-CM

## 2024-01-05 DIAGNOSIS — R202 Paresthesia of skin: Secondary | ICD-10-CM

## 2024-01-05 NOTE — Progress Notes (Signed)
 Specialty Pharmacy Refill Coordination Note  Lindsey Massey is a 63 y.o. female contacted today regarding refills of specialty medication(s) Adalimumab  (Humira -CD/UC/HS Starter, Humira  (2 Pen))   Patient requested Delivery   Delivery date: 01/07/24   Verified address: [address] [place] [address]   Medication will be filled on 01/06/24.

## 2024-01-06 NOTE — Progress Notes (Signed)
  Because of severity of pain and associated numbness/weakness, I feel your condition warrants further evaluation and I recommend that you be seen in a face-to-face visit.   NOTE: There will be NO CHARGE for this E-Visit   If you are having a true medical emergency, please call 911.     For an urgent face to face visit, [facility] has multiple urgent care centers for your convenience.  Click the link below for the full list of locations and hours, walk-in wait times, appointment scheduling options and driving directions:  Urgent Care - [place], [place], [place], [place], [place], [place]  [place]     Your MyChart E-visit questionnaire answers were reviewed by a board certified advanced clinical practitioner to complete your personal care plan based on your specific symptoms.    Thank you for using e-Visits.

## 2024-01-06 NOTE — Telephone Encounter (Signed)
 If I remember correctly, I did send a new prescription for a new ARB.  Please check on this.

## 2024-01-21 ENCOUNTER — Other Ambulatory Visit: Payer: Self-pay | Admitting: Emergency Medicine

## 2024-01-21 DIAGNOSIS — F418 Other specified anxiety disorders: Secondary | ICD-10-CM

## 2024-01-21 DIAGNOSIS — M810 Age-related osteoporosis without current pathological fracture: Secondary | ICD-10-CM

## 2024-01-29 ENCOUNTER — Ambulatory Visit: Payer: Self-pay

## 2024-01-29 ENCOUNTER — Other Ambulatory Visit: Payer: Self-pay | Admitting: Gastroenterology

## 2024-01-29 ENCOUNTER — Other Ambulatory Visit: Payer: Self-pay

## 2024-01-29 ENCOUNTER — Telehealth: Payer: Self-pay

## 2024-01-29 ENCOUNTER — Encounter (INDEPENDENT_AMBULATORY_CARE_PROVIDER_SITE_OTHER): Payer: Self-pay

## 2024-01-29 ENCOUNTER — Other Ambulatory Visit: Payer: Self-pay | Admitting: Radiology

## 2024-01-29 ENCOUNTER — Telehealth: Payer: Self-pay | Admitting: Gastroenterology

## 2024-01-29 DIAGNOSIS — N958 Other specified menopausal and perimenopausal disorders: Secondary | ICD-10-CM

## 2024-01-29 MED ORDER — HUMIRA (2 PEN) 40 MG/0.4ML ~~LOC~~ AJKT
40.0000 mg | AUTO-INJECTOR | SUBCUTANEOUS | 3 refills | Status: AC
  Filled 2024-01-29: qty 2, 28d supply, fill #1
  Filled 2024-01-29: qty 2, 28d supply, fill #0
  Filled 2024-02-19: qty 2, 28d supply, fill #1
  Filled 2024-02-25 – 2024-03-10 (×2): qty 2, 28d supply, fill #2
  Filled 2024-03-26: qty 2, 28d supply, fill #3
  Filled 2024-04-26: qty 2, 28d supply, fill #4

## 2024-01-29 MED ORDER — HUMIRA (2 PEN) 40 MG/0.4ML ~~LOC~~ AJKT: 40.0000 mg | AUTO-INJECTOR | SUBCUTANEOUS | 3 refills | Status: DC

## 2024-01-29 NOTE — Telephone Encounter (Signed)
 Prescription has been sent to [facility] for 90 day supply. Pt aware

## 2024-01-29 NOTE — Telephone Encounter (Signed)
 Patient reports severe back pain along with some dizziness. Patient states she needs to be seen ASAP due to a procedure. Patient is asking for a call from office to schedule.  FYI Only or Action Required?: Action required by provider: request for appointment and clinical question for provider.  Patient was last seen in primary care on 08/12/2023 by Purcell Emil Schanz, MD.  Called Nurse Triage reporting Back Pain.  Symptoms began several weeks ago.  Interventions attempted: OTC medications: ibuprofen  and Rest, hydration, or home remedies.  Symptoms are: unchanged.  Triage Disposition: See HCP Within 4 Hours (Or PCP Triage)  Patient/caregiver understands and will follow disposition?: No, wishes to speak with PCP  Copied from CRM [id number]. Topic: Appointments - Appointment Scheduling >> Jan 29, 2024 11:09 AM Alfonso HERO wrote: Patient states that she's having pain and dizziness. Reason for Disposition  [1] SEVERE back pain (e.g., excruciating, unable to do any normal activities) AND [2] not improved 2 hours after pain medicine  Answer Assessment - Initial Assessment Questions 1. ONSET: When did the pain begin? (e.g., minutes, hours, days)     2 months 2. LOCATION: Where does it hurt? (upper, mid or lower back)     Lower back 3. SEVERITY: How bad is the pain?  (e.g., Scale 1-10; mild, moderate, or severe)     10 4. PATTERN: Is the pain constant? (e.g., yes, no; constant, intermittent)      constant 5. RADIATION: Does the pain shoot into your legs or somewhere else?     yes 6. CAUSE:  What do you think is causing the back pain?      unsure 7. BACK OVERUSE:  Any recent lifting of heavy objects, strenuous work or exercise?     no 8. MEDICINES: What have you taken so far for the pain? (e.g., nothing, acetaminophen , NSAIDS)     ibuprofen  9. NEUROLOGIC SYMPTOMS: Do you have any weakness, numbness, or problems with bowel/bladder control?     weakness 10. OTHER  SYMPTOMS: Do you have any other symptoms? (e.g., fever, abdomen pain, burning with urination, blood in urine)       dizziness  Protocols used: Back Pain-A-AH

## 2024-01-29 NOTE — Telephone Encounter (Signed)
 Inbound call from patient stating her pharmacy recommended her to get a prescription for a 90 day supply of Humira  injections, patient would like to know if she can have that supply. Please advise Thank you

## 2024-01-29 NOTE — Telephone Encounter (Signed)
 Med refill request: Premarin  Cream  Last AEX: 06/13/23 Next AEX: not scheduled yet, message sent to FD to try and schedule in advance  Last MMG (if hormonal med) 01/16/23 birads cat 1 neg  Refill authorized: Last rx 09/26/23 42.5 g with 3 refills. Please advise

## 2024-01-29 NOTE — Telephone Encounter (Signed)
 I have resent to [facility].

## 2024-01-29 NOTE — Progress Notes (Signed)
 Specialty Pharmacy Refill Coordination Note  Lindsey Massey is a 63 y.o. female contacted today regarding refills of specialty medication(s) No data recorded  Patient requested (Patient-Rptd) Delivery   Delivery date: 01/30/24   Verified address: [address]   [place] [phone] (spoke with pt via interpreter)   Medication will be filled on 01/29/24.

## 2024-01-30 ENCOUNTER — Other Ambulatory Visit: Payer: Self-pay

## 2024-01-30 NOTE — Telephone Encounter (Signed)
 Patient has been scheduled

## 2024-02-02 ENCOUNTER — Other Ambulatory Visit: Payer: Self-pay

## 2024-02-03 ENCOUNTER — Ambulatory Visit (INDEPENDENT_AMBULATORY_CARE_PROVIDER_SITE_OTHER)

## 2024-02-03 ENCOUNTER — Ambulatory Visit: Admitting: Emergency Medicine

## 2024-02-03 ENCOUNTER — Ambulatory Visit: Payer: Self-pay | Admitting: Emergency Medicine

## 2024-02-03 ENCOUNTER — Encounter: Payer: Self-pay | Admitting: Emergency Medicine

## 2024-02-03 VITALS — BP 110/78 | HR 87 | Temp 98.3°F | Ht 66.0 in | Wt 175.4 lb

## 2024-02-03 DIAGNOSIS — M549 Dorsalgia, unspecified: Secondary | ICD-10-CM

## 2024-02-03 DIAGNOSIS — I10 Essential (primary) hypertension: Secondary | ICD-10-CM | POA: Diagnosis not present

## 2024-02-03 DIAGNOSIS — M810 Age-related osteoporosis without current pathological fracture: Secondary | ICD-10-CM

## 2024-02-03 DIAGNOSIS — K5080 Crohn's disease of both small and large intestine without complications: Secondary | ICD-10-CM

## 2024-02-03 DIAGNOSIS — E785 Hyperlipidemia, unspecified: Secondary | ICD-10-CM | POA: Diagnosis not present

## 2024-02-03 MED ORDER — SIMVASTATIN 20 MG PO TABS: 20.0000 mg | ORAL_TABLET | Freq: Every day | ORAL | 1 refills | Status: AC

## 2024-02-03 MED ORDER — VALSARTAN 160 MG PO TABS: 160.0000 mg | ORAL_TABLET | Freq: Every day | ORAL | 3 refills | Status: AC

## 2024-02-03 MED ORDER — TRAMADOL HCL 50 MG PO TABS: 50.0000 mg | ORAL_TABLET | Freq: Three times a day (TID) | ORAL | 1 refills | Status: AC | PRN

## 2024-02-03 NOTE — Assessment & Plan Note (Signed)
 Clinically stable.  No red flag signs or symptoms. Mechanical in nature. Diffuse musculoskeletal back pain Pain management discussed Recommend x-rays today.  Will review images when available Recommend Tylenol  for mild to moderate pain and tramadol  for moderate to severe pain Patient has history of chronic pain syndrome.  May need referral to pain management clinic in the near future Also recommend osteoporosis management at osteoporosis management clinic.  Referral placed today.

## 2024-02-03 NOTE — Assessment & Plan Note (Signed)
 BP Readings from Last 3 Encounters:  02/03/24 110/78  10/21/23 107/62  10/05/23 127/80  Well-controlled hypertension Continue candesartan  8 mg daily Cardiovascular risks associated with hypertension discussed Diet and nutrition discussed

## 2024-02-03 NOTE — Assessment & Plan Note (Signed)
 DEXA scan report from 05/06/2023 reviewed with patient Shows osteoporosis Recommend Prolia  injections every 6 months Refer to osteoporosis management clinic

## 2024-02-03 NOTE — Patient Instructions (Signed)
 ??? ????? ????? ??? ???????? Acute Back Pain, Adult ???? ????? ?????? ???? ??????? ??? ???? ???? ????? ?????. ??????? ?? ???? ???? ????? ?? ????? ?????? ???????. ????? ???? ??? ???????:  ???? ??? ??????? ?? ??????? ???? ?? ?????? ?? ?????. ???????? ?? ??????? ???? ???? ?????? ??????. ???? ?? ???? ??? ??????? ?????? ??? ????? ??? ?? ?? ?????.  ???? (?????) ????? ?????? ?????? ???????. ?????? ????? ?????? ?????? ?? ???? ?????? ????? ??? ???????? ??? ???? (?????) ?????? ??????.  ????? ??????? ??? ???? ?? ????? ?????? ??????? ?? ??????? ????????.  ???? ???? ??? ?????.  ?????? ???????. ?? ????? ?? ??? ???? ????? ?????? ????? ??????? ?????? ??? ??????. ????? ?? ???? ???? ????? ?????? ?? ?????? ???????? ?? ??????. ???? ????????? ??????? ?? ??????: ??????? ??? ????? ??????? ???????  ????? ??????? ??? ??????? ???? ??????? ??????? ????? ????? ????? ???? ?? ??? ???? ????. ?? ???? ??????? ?????? ????? ??????? ?????? ???? ?? ???? ???? ?? ????? ??? ????? ?? ????? ????? ???????.  ?? ???? ???? ??????? ?????? ???? ????? ??? ???? ????? ?? ????? ??? 24-48 ???? ?? ????? ?????. ????? ??? ?????? ??????? ???????: ? ??? ????? ?? ??? ???????. ? ?? ????? ??? ???? ??????. ? ???? ????? ???? 20 ?????? ???? ??? ????? 2-3 ???? ??????. ? ??? ????? ??? ???? ??? ????? ??? ?????? ??????. ???? ??? ??? ????. ???? ??? ?? ?????? ?????? ?????? ?? ??????? ?? ???????? ???? ???? ???? ???? ????? ????? ?? ?????? ???? ??? ???? ?????.  ??? ??????? ??? ??????? ??????? ??? ??? ?????? ???? ????? ?? ???? ??????? ??????? ??? ????? ??????? ????. ?????? ???? ??????? ???? ????? ?? ????? ??????? ?????? ??? ?????? ??????? ?? ????? ???????. ? ???? ?? ????? ??? ????? ????? ???????. ? ???? ???? ??????? ??? ?????? ???? ?????? ?? 20-30 ?????. ? ??? ????? ??? ???? ??? ????? ??? ????? ?????? ??????. ???? ???? ??? ?????? ??? ??? ????? ???? ?????? ?????? ?? ??????? ?? ???????. ??? ?????? ??? ??????? ????. ??????   ?? ???? ?? ??????. ??????? ?? ?????? ?????  ?? ??? ?? ????? ?? ???? ??????.  ???? ??? ?? ??? ??????. ???? ??????? ?????? ????? ???? ?????? ?? ????? ????? ??? ??????. ? ??? ???? ??? ????? ????? ?????? ??? ??? ?? ???? ???????. ???? ???? ?????. ???? ????? ?????? ??? ?????? ?? ??? ?????? ?????? 90 ???? (????? ?????). ? ???? ?? ???? ??? ??????? ?? ???? ??????? ??? ????? ???????. ?? ????? ?????? ????? ????? ????? (??????? ????????) ??? ???? ???????? ??? ??? ?????.  ??? ?????? ??? ???? ?????? ????? ????? ??? ???. ???? ????? ??? ??? ????? ?? ???.  ?? ???? ?? ??? ?? ??? ?? ???? ????? ????? ?? 30 ????? ?? ????? ???????. ??????? ?? ?????? ?????? ????? ?? ????? ???? ?? ???? ????? ??? ????.  ?? ??? ??????? ?? ?????? ??????? ????? ??? ??? ????? ??????? ??????? ????? ???????? ?? ??? ??????.  ???? ???????? ??????? ??? ??? ???????. ??? ??? ????? ???? ??? ???????? ???? ????? ????? ?????? ?? ????? ??? ????: ? ??? ??????. ? ???? ????? ?????? ?? ????. ? ???? ????????.  ???? ???? ??????? ??????? ??? ??????? ???? ??????? ??????. ???????? ????? ?? ???? ????? ????? ???????? ?? ?????? ????? ??????? ??? ??? ??????? ????????.  ????? ??????? ???? ????? ?????? ??? ???? ?????? ????? ??? ??? ?????? ???? ??????? ?????? ??????? ??. ??? ????? ?????? ??????? ??? ???? ??? ??????? ??????? ?????? ??????? ??????? ??. ??? ??????  ???? ??? ???? ?? ?????? ??????. ???? ??? ????? ?????? ?????? ????? ??? ???? ????? ?? ????? ?????? ??? ????? ??????.  ???? ??????? ?? ??????? ???? ????? ???? ?????? ?? ?????. ????? ????? ?????? ?????? ?? ????? ???? ??????? ????? ?? ???? ??? ????? ?????. ???? ??? ??????? ??? ????? ??????? ??? ?????? ???????. ??????? ????  ????? ?????? ?? ??? ???? ??? ????? ????. ???? ????? ??? ???? ?? ??? ???????? ??????. ??? ??????? ??? ????? ??? ????? ???? ??????.  ???? ??? ??????? ???? ?????? ?? ?????? ?????? (????? ????????) ??? ??????? ??????? ??????????? ??? ??????? ?????? ?? ??????? ???????. ????? ??? ?????? ??????? ???????: ? ??? ????? ????? ?? ????? ?????? ?????  ???? ????? ?? ??? ???? ?? ????? ????? ?????. ? ??? ?????? ????? ?? ?????? ?????? ??? ????? ???? ?? ????? ????? ??? ??????.  ???? ??? ????? ??? ??????? ???? ??????? ??????. ????? ???? ???: ? ?????? ??????? ?? ???????. ? ?????? ?????? ?????? ?? ???????. ? ?????? ?????? ?? ??????. ???? ?????? ??????? ?????? ?? ??????? ???????:  ?????? ???? ?? ??? ?? ?????? ?? ??????.  ?????? ???? ?????? ???? ?????? ??? ????? ?? ???????.  ??? ???? ????? ??? ???????.  ??????? ?????? ?? ??????.  ??? ???? ????? ??? ?????? ???.  ?????? ???? ?? ???????.  ??????? ?????? ?? ???.  ????? ???? ?? ?????. ???? ???????? ??? ????? ?? ??????? ???????:  ???? ????? ????? ?? ??????? ?? ???????.  ?????? ?????? ?? ??? ??? ??????? ?? ???????? ?? ???????.  ?????? ??????. ???? ???? ??? ??????? ????? ????? ????? ???? ???? ?????. ??? ?? ????? ??? ?? ???? ???????. ?? ???? ???????? ?????? ??? ?????. ???? ?????? ??????? ??????? (  911 ?? ???????? ??????? ?????????). ?? ??? ??????? ????? ??? ????????. ????  ???? ????? ?????? ???? ??????? ??? ???? ???? ????? ?????.  ???? ???????? ??????? ??? ??? ???????. ??? ??? ????? ???? ??? ???????? ???? ????? ????? ?????? ?? ????? ??? ????.  ????? ??????? ?? ???????? ??? ??????? ????? ??????? ?????? ??? ????? ????? ????? ???? ?? ??? ???? ????. ??? ????? ?? ??? ????????? ?? ???? ?????? ????????? ???? ?????? ???? ??????? ??????. ???? ?? ?????? ??? ????? ???? ?? ???? ?? ???? ??????? ??????.? Document Revised: 07/12/2020 Document Reviewed: 07/12/2020 Else[employer] Patient Education  2024 [employer].

## 2024-02-03 NOTE — Progress Notes (Signed)
 Lindsey Massey 63 y.o.   Chief Complaint  Patient presents with   Back Pain    Back pain    Leg Pain    HISTORY OF PRESENT ILLNESS: This is a 63 y.o. female complaining of back and bilateral hip pain on and off for the past several months Has history of osteoporosis and chronic pain syndrome Also history of Crohn's disease History of hypertension Denies injury or any other associated symptoms No other complaints or medical concerns today.  Back Pain Associated symptoms include leg pain. Pertinent negatives include no abdominal pain, chest pain, dysuria, fever or headaches.  Leg Pain      Prior to Admission medications   Medication Sig Start Date End Date Taking? Authorizing Provider  adalimumab  (HUMIRA , 2 PEN,) 40 MG/0.4ML pen Inject 0.4 mLs (40 mg total) into the skin every 14 (fourteen) days. Inject 40 mg day 29 then every 2 weeks 01/29/24   Mansouraty, Aloha Raddle., MD  adalimumab  (HUMIRA -CD/UC/HS STARTER) 80 MG/0.8ML pen Inject 160 mg day 1, inject 80 mg day 15 10/24/23   Mansouraty, Gabriel Jr., MD  ALPRAZolam  (XANAX ) 0.5 MG tablet TAKE 1 TABLET BY MOUTH EVERY DAY AS NEEDED FOR ANXIETY 01/21/24   Purcell Emil Schanz, MD  aspirin  EC 81 MG tablet Take 1 tablet (81 mg total) by mouth daily. Swallow whole. 01/24/23   Mansouraty, Aloha Raddle., MD  azaTHIOprine  (IMURAN ) 50 MG tablet TAKE 1 TABLET BY MOUTH DAILY 07/23/23   Mansouraty, Gabriel Jr., MD  calcium  carbonate (CALCIUM  ANTACID) 500 MG chewable tablet Chew 1 tablet (200 mg of elemental calcium  total) by mouth daily. Patient not taking: Reported on 09/30/2023 01/24/23   Mansouraty, Aloha Raddle., MD  cephALEXin  (KEFLEX ) 500 MG capsule Take 1 capsule (500 mg total) by mouth 4 (four) times daily. 10/05/23   Odell Balls, PA-C  Cholecalciferol  (VITAMIN D3) 125 MCG (5000 UT) CAPS Take 1 capsule (5,000 Units total) by mouth daily. 01/24/23   Mansouraty, Aloha Raddle., MD  conjugated estrogens  (PREMARIN ) vaginal cream PLACE ONE-HALF  APPLICATORFUL  VAGINALLY 3 TIMES WEEKLY 01/29/24   Chrzanowski, Jami B, NP  Cyanocobalamin (VITAMIN B 12 PO) Take by mouth. Patient not taking: Reported on 10/21/2023    [provider]  esomeprazole  (NEXIUM ) 40 MG capsule TAKE 1 CAPSULE BY MOUTH DAILY 08/14/23   Mansouraty, Gabriel Jr., MD  fluticasone (FLONASE) 50 MCG/ACT nasal spray Place 1 spray into both nostrils 2 (two) times daily. 09/05/23   [provider]  ibandronate  (BONIVA ) 150 MG tablet Take 1 tablet (150 mg total) by mouth every 30 (thirty) days. Take in the morning with a full glass of water, on an empty stomach, and do not take anything else by mouth or lie down for the next 30 min. 07/04/23   Purcell Emil Schanz, MD  ibuprofen  (ADVIL ) 600 MG tablet Take 1 tablet (600 mg total) by mouth every 6 (six) hours as needed. 10/05/23   Odell Balls, PA-C  Magnesium  250 MG CAPS Take 250 mg by mouth daily. Takes 500 04/30/23   Purcell Emil Schanz, MD  meloxicam (MOBIC) 15 MG tablet Take 15 mg by mouth daily as needed. 10/08/23   [provider]  methocarbamol  (ROBAXIN -750) 750 MG tablet Take 1 tablet (750 mg total) by mouth 2 (two) times daily as needed for muscle spasms. 05/06/23   Jule Ronal CROME, PA-C  mirtazapine  (REMERON ) 15 MG tablet Take 1 tablet (15 mg total) by mouth at bedtime. 06/16/23   Tanette Chauca Jose, MD  mupirocin  ointment (  BACTROBAN ) 2 % Apply 1 Application topically 2 (two) times daily. 06/16/23   Havier Deeb Jose, MD  ofloxacin (OCUFLOX) 0.3 % ophthalmic solution PLEASE SEE ATTACHED FOR DETAILED DIRECTIONS 07/01/23   [provider]  oxyCODONE -acetaminophen  (PERCOCET/ROXICET) 5-325 MG tablet Take 1 tablet by mouth every 6 (six) hours as needed for severe pain (pain score 7-10). 10/05/23   Odell Balls, PA-C  prednisoLONE  acetate (PRED FORTE ) 1 % ophthalmic suspension Place 1 drop into the left eye 4 (four) times daily. 06/16/23   Purcell Emil Schanz, MD  predniSONE  (DELTASONE ) 10 MG  tablet Take by mouth. 08/10/23   [provider]  predniSONE  (STERAPRED UNI-PAK 21 TAB) 10 MG (21) TBPK tablet Take by mouth daily. Take 6 tabs by mouth daily  for 2 days, then 5 tabs for 2 days, then 4 tabs for 2 days, then 3 tabs for 2 days, 2 tabs for 2 days, then 1 tab by mouth daily for 2 days 08/10/23   Donnajean Lynwood DEL, PA-C  sertraline  (ZOLOFT ) 50 MG tablet Take 1 tablet (50 mg total) by mouth daily. 10/03/23   Purcell Emil Schanz, MD  simvastatin  (ZOCOR ) 20 MG tablet TAKE 1 TABLET BY MOUTH EVERY DAY 09/08/23   Purcell Emil Schanz, MD  sucralfate  (CARAFATE ) 1 g tablet TAKE 1 TABLET BY MOUTH TWICE  DAILY 01/29/24   Mansouraty, Gabriel Jr., MD  traMADol  (ULTRAM ) 50 MG tablet Take 1 tablet (50 mg total) by mouth 2 (two) times daily as needed. 05/06/23   Jule Ronal CROME, PA-C  valsartan  (DIOVAN ) 160 MG tablet Take 1 tablet (160 mg total) by mouth daily. 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) 01/08/2023   Immunosuppression due to drug therapy 01/08/2023   Dyslipidemia 09/16/2011   Age-related osteoporosis without current pathological fracture 09/16/2011   GERD without esophagitis 09/16/2011   Uterine fibroid 06/26/2011   Depression 06/26/2011   Hypercholesterolemia 06/26/2011    Past Medical History:  Diagnosis Date   Asthma    Cataract    Crohn disease (HCC)    Depressed    GERD (gastroesophageal reflux disease)    High cholesterol    Hx of tonsillectomy    Hypercholesteremia    Hypertension     Osteoporosis    Shortness of breath    sometimes before sleep   Uterine fibroid     Past Surgical History:  Procedure Laterality Date   COLONOSCOPY  04/2023   DILATION AND CURRETTAGE     LASIK     [place] and then [place] done in [place]    TONSILLECTOMY     age 67   TONSILLECTOMY AND ADENOIDECTOMY      Social History   Socioeconomic History   Marital status: Divorced    Spouse name: Not on file   Number of children: Not on file   Years of education: Not on file   Highest education level: 12th grade  Occupational History   Not on file  Tobacco Use   Smoking status: Every Day    Current  packs/day: 5.00    Average packs/day: 5.0 packs/day for 11.8 years (59.1 ttl pk-yrs)    Types: Cigarettes    Start date: 2014   Smokeless tobacco: Not on file  Vaping Use   Vaping status: Never Used  Substance and Sexual Activity   Alcohol use: No   Drug use: No   Sexual activity: Not Currently    Partners: Male    Birth control/protection: Post-menopausal    Comment: menarche 63yo, sexual debut 63yo  Other Topics Concern   Not on file  Social History Narrative   Lives with sister and nephews (18,16) in [place]. Moved from [place]  in April 2012. Separated from husband. No children. Does not work.      Balanced diet. Little exercise.      Christian faith.   Social Drivers of Corporate Investment Banker Strain: High Risk (01/30/2024)   Overall Financial Resource Strain (CARDIA)    Difficulty of Paying Living Expenses: Very hard  Food Insecurity: Food Insecurity Present (01/30/2024)   Hunger Vital Sign    Worried About Running Out of Food in the Last Year: Sometimes true    Ran Out of Food in the Last Year: Sometimes true  Transportation Needs: Unmet Transportation Needs (01/30/2024)   PRAPARE - Administrator, Civil Service (Medical): Yes    Lack of Transportation (Non-Medical): Yes  Physical Activity: Inactive (01/30/2024)   Exercise Vital  Sign    Days of Exercise per Week: 0 days    Minutes of Exercise per Session: Not on file  Stress: Stress Concern Present (01/30/2024)   [employer] of Occupational Health - Occupational Stress Questionnaire    Feeling of Stress: Very much  Social Connections: Socially Isolated (01/30/2024)   Social Connection and Isolation Panel    Frequency of Communication with Friends and Family: Once a week    Frequency of Social Gatherings with Friends and Family: Never    Attends Religious Services: Never    Database Administrator or Organizations: No    Attends Engineer, Structural: Not on file    Marital Status: Widowed  Catering Manager Violence: Not on file    Family History  Problem Relation Age of Onset   Arthritis Mother    Hypertension Mother    Heart disease Mother    Cancer Father        liver   Hypertension Father    Arthritis Sister    Hypertension Brother    Multiple sclerosis Brother    Heart disease Paternal Uncle    Anesthesia problems Neg Hx    Colon cancer Neg Hx    Esophageal cancer Neg Hx    Inflammatory bowel disease Neg Hx    Liver disease Neg Hx    Pancreatic cancer Neg Hx    Rectal cancer Neg Hx    Stomach cancer Neg Hx    Colon polyps Neg Hx      Review of Systems  Constitutional: Negative.  Negative for chills and fever.  HENT: Negative.  Negative for congestion and sore throat.   Respiratory: Negative.  Negative for cough and shortness of breath.   Cardiovascular: Negative.  Negative for chest pain and palpitations.  Gastrointestinal:  Negative for abdominal pain, diarrhea, nausea and vomiting.  Genitourinary: Negative.  Negative for dysuria and hematuria.  Musculoskeletal:  Positive for back pain.  Skin: Negative.  Negative for rash.  Neurological: Negative.  Negative for dizziness and headaches.  All other systems reviewed and  are negative.   Today's Vitals   02/03/24 1320  BP: 110/78  Pulse: 87  Temp: 98.3 F (36.8 C)   TempSrc: Oral  SpO2: 95%  Weight: 175 lb 6.4 oz (79.6 kg)  Height: 5' 6 (1.676 m)   Body mass index is 28.31 kg/m.   Physical Exam Vitals reviewed.  Constitutional:      Appearance: Normal appearance.  HENT:     Head: Normocephalic.     Mouth/Throat:     Mouth: Mucous membranes are moist.     Pharynx: Oropharynx is clear.  Eyes:     Extraocular Movements: Extraocular movements intact.     Pupils: Pupils are equal, round, and reactive to light.  Cardiovascular:     Rate and Rhythm: Normal rate and regular rhythm.     Pulses: Normal pulses.     Heart sounds: Normal heart sounds.  Pulmonary:     Effort: Pulmonary effort is normal.     Breath sounds: Normal breath sounds.  Abdominal:     Palpations: Abdomen is soft.     Tenderness: There is no abdominal tenderness.  Musculoskeletal:     Cervical back: No tenderness.     Right lower leg: No edema.     Left lower leg: No edema.  Lymphadenopathy:     Cervical: No cervical adenopathy.  Skin:    General: Skin is warm and dry.     Capillary Refill: Capillary refill takes less than 2 seconds.  Neurological:     General: No focal deficit present.     Mental Status: She is alert and oriented to person, place, and time.  Psychiatric:        Mood and Affect: Mood normal.        Behavior: Behavior normal.   DG Lumbar Spine 2-3 Views Result Date: 02/03/2024 EXAM: 2 or 3 VIEW(S) XRAY OF THE LUMBAR SPINE 02/03/2024 01:49:11 PM COMPARISON: 01/31/2023 CLINICAL HISTORY: BACK PAIN  3 MONTHS WITHOUT AN INJURY. FINDINGS: LUMBAR SPINE: BONES: No acute fracture. No aggressive appearing osseous lesion. Alignment is normal. DISCS AND DEGENERATIVE CHANGES: Minimal degenerative osteophyte formation is noted in L2-3 and L3-4. SOFT TISSUES: No acute abnormality. IMPRESSION: 1. No acute osseous abnormality of the lumbar spine. 2. Minimal degenerative osteophyte formation at L2-3 and L3-4. Electronically signed by: Lynwood Seip MD 02/03/2024 02:02 PM  EDT RP Workstation: [id number]   DG Thoracic Spine 2 View Result Date: 02/03/2024 CLINICAL DATA:  Back pain. EXAM: THORACIC SPINE 2 VIEWS COMPARISON:  None Available. FINDINGS: No acute fracture or subluxation of the thoracic spine. The bones are osteopenic. Multilevel degenerative changes. The soft tissues are unremarkable. IMPRESSION: No acute findings. Multilevel degenerative changes. Electronically Signed   By: Vanetta Chou M.D.   On: 02/03/2024 14:00      ASSESSMENT & PLAN: A total of 43 minutes was spent with the patient and counseling/coordination of care regarding preparing for this visit, review of most recent office visit notes, review of multiple chronic medical conditions and their management, differential diagnosis of back pain and need for x-rays, pain management, review of x-ray images done today, review of all medications, review of most recent bloodwork results, review of health maintenance items, education on nutrition, prognosis, documentation, and need for follow up.   Problem List Items Addressed This Visit       Cardiovascular and Mediastinum   Essential hypertension   BP Readings from Last 3 Encounters:  02/03/24 110/78  10/21/23 107/62  10/05/23 127/80  Well-controlled hypertension Continue  candesartan  8 mg daily Cardiovascular risks associated with hypertension discussed Diet and nutrition discussed       Relevant Medications   valsartan  (DIOVAN ) 160 MG tablet   simvastatin  (ZOCOR ) 20 MG tablet     Digestive   Crohn's disease of both small and large intestine without complication (HCC)   Recently seen by GI doctor Recent upper and lower endoscopy reports reviewed Crohn's medications handled by GI's office        Musculoskeletal and Integument   Osteoporosis without current pathological fracture   DEXA scan report from 05/06/2023 reviewed with patient Shows osteoporosis Recommend Prolia  injections every 6 months Refer to osteoporosis management  clinic      Relevant Orders   Amb Referral to Osteoporosis Management      Other   Dyslipidemia   Stable chronic condition Diet and nutrition discussed Continue simvastatin  10 mg daily      Relevant Medications   simvastatin  (ZOCOR ) 20 MG tablet   Musculoskeletal back pain - Primary   Clinically stable.  No red flag signs or symptoms. Mechanical in nature. Diffuse musculoskeletal back pain Pain management discussed Recommend x-rays today.  Will review images when available Recommend Tylenol  for mild to moderate pain and tramadol  for moderate to severe pain Patient has history of chronic pain syndrome.  May need referral to pain management clinic in the near future Also recommend osteoporosis management at osteoporosis management clinic.  Referral placed today.      Relevant Medications   traMADol  (ULTRAM ) 50 MG tablet   Other Relevant Orders   DG Lumbar Spine 2-3 Views   DG Thoracic Spine 2 View   Patient Instructions  ??? ????? ????? ??? ???????? Acute Back Pain, Adult ???? ????? ?????? ???? ??????? ??? ???? ???? ????? ?????. ??????? ?? ???? ???? ????? ?? ????? ?????? ???????. ????? ???? ??? ???????:  ???? ??? ??????? ?? ??????? ???? ?? ?????? ?? ?????. ???????? ?? ??????? ???? ???? ?????? ??????. ???? ?? ???? ??? ??????? ?????? ??? ????? ??? ?? ?? ?????.  ???? (?????) ????? ?????? ?????? ???????. ?????? ????? ?????? ?????? ?? ???? ?????? ????? ??? ???????? ??? ???? (?????) ?????? ??????.  ????? ??????? ??? ???? ?? ????? ?????? ??????? ?? ??????? ????????.  ???? ???? ??? ?????.  ?????? ???????. ?? ????? ?? ??? ???? ????? ?????? ????? ??????? ?????? ??? ??????. ????? ?? ???? ???? ????? ?????? ?? ?????? ???????? ?? ??????. ???? ????????? ??????? ?? ??????: ??????? ??? ????? ??????? ???????  ????? ??????? ??? ??????? ???? ??????? ??????? ????? ????? ????? ???? ?? ??? ???? ????. ?? ???? ??????? ?????? ????? ??????? ?????? ???? ?? ???? ???? ?? ????? ??? ????? ?? ????? ?????  ???????.  ?? ???? ???? ??????? ?????? ???? ????? ??? ???? ????? ?? ????? ??? 24-48 ???? ?? ????? ?????. ????? ??? ?????? ??????? ???????: ? ??? ????? ?? ??? ???????. ? ?? ????? ??? ???? ??????. ? ???? ????? ???? 20 ?????? ???? ??? ????? 2-3 ???? ??????. ? ??? ????? ??? ???? ??? ????? ??? ?????? ??????. ???? ??? ??? ????. ???? ??? ?? ?????? ?????? ?????? ?? ??????? ?? ???????? ???? ???? ???? ???? ????? ????? ?? ?????? ???? ??? ???? ?????.  ??? ??????? ??? ??????? ??????? ??? ??? ?????? ???? ????? ?? ???? ??????? ??????? ??? ????? ??????? ????. ?????? ???? ??????? ???? ????? ?? ????? ??????? ?????? ??? ?????? ??????? ?? ????? ???????. ? ???? ?? ????? ??? ????? ????? ???????. ? ???? ???? ??????? ??? ?????? ???? ?????? ?? 20-30 ?????. ? ??? ????? ??? ???? ??? ????? ??? ????? ?????? ??????. ???? ???? ??? ?????? ??? ??? ????? ???? ?????? ?????? ?? ??????? ?? ???????. ??? ?????? ??? ??????? ????. ??????   ?? ???? ?? ??????. ??????? ?? ?????? ????? ?? ??? ?? ????? ?? ???? ??????.  ???? ??? ?? ??? ??????. ???? ??????? ?????? ????? ???? ?????? ?? ????? ????? ??? ??????. ? ??? ???? ??? ????? ????? ?????? ??? ??? ?? ???? ???????. ???? ???? ?????. ???? ????? ?????? ??? ?????? ?? ??? ?????? ??????  90 ???? (????? ?????). ? ???? ?? ???? ??? ??????? ?? ???? ??????? ??? ????? ???????. ?? ????? ?????? ????? ????? ????? (??????? ????????) ??? ???? ???????? ??? ??? ?????.  ??? ?????? ??? ???? ?????? ????? ????? ??? ???. ???? ????? ??? ??? ????? ?? ???.  ?? ???? ?? ??? ?? ??? ?? ???? ????? ????? ?? 30 ????? ?? ????? ???????. ??????? ?? ?????? ?????? ????? ?? ????? ???? ?? ???? ????? ??? ????.  ?? ??? ??????? ?? ?????? ??????? ????? ??? ??? ????? ??????? ??????? ????? ???????? ?? ??? ??????.  ???? ???????? ??????? ??? ??? ???????. ??? ??? ????? ???? ??? ???????? ???? ????? ????? ?????? ?? ????? ??? ????: ? ??? ??????. ? ???? ????? ?????? ?? ????. ? ???? ????????.  ???? ???? ??????? ??????? ??? ??????? ???? ???????  ??????. ???????? ????? ?? ???? ????? ????? ???????? ?? ?????? ????? ??????? ??? ??? ??????? ????????.  ????? ??????? ???? ????? ?????? ??? ???? ?????? ????? ??? ??? ?????? ???? ??????? ?????? ??????? ??. ??? ????? ?????? ??????? ??? ???? ??? ??????? ??????? ?????? ??????? ??????? ??. ??? ??????  ???? ??? ???? ?? ?????? ??????. ???? ??? ????? ?????? ?????? ????? ??? ???? ????? ?? ????? ?????? ??? ????? ??????.  ???? ??????? ?? ??????? ???? ????? ???? ?????? ?? ?????. ????? ????? ?????? ?????? ?? ????? ???? ??????? ????? ?? ???? ??? ????? ?????. ???? ??? ??????? ??? ????? ??????? ??? ?????? ???????. ??????? ????  ????? ?????? ?? ??? ???? ??? ????? ????. ???? ????? ??? ???? ?? ??? ???????? ??????. ??? ??????? ??? ????? ??? ????? ???? ??????.  ???? ??? ??????? ???? ?????? ?? ?????? ?????? (????? ????????) ??? ??????? ??????? ??????????? ??? ??????? ?????? ?? ??????? ???????. ????? ??? ?????? ??????? ???????: ? ??? ????? ????? ?? ????? ?????? ????? ???? ????? ?? ??? ???? ?? ????? ????? ?????. ? ??? ?????? ????? ?? ?????? ?????? ??? ????? ???? ?? ????? ????? ??? ??????.  ???? ??? ????? ??? ??????? ???? ??????? ??????. ????? ???? ???: ? ?????? ??????? ?? ???????. ? ?????? ?????? ?????? ?? ???????. ? ?????? ?????? ?? ??????. ???? ?????? ??????? ?????? ?? ??????? ???????:  ?????? ???? ?? ??? ?? ?????? ?? ??????.  ?????? ???? ?????? ???? ?????? ??? ????? ?? ???????.  ??? ???? ????? ??? ???????.  ??????? ?????? ?? ??????.  ??? ???? ????? ??? ?????? ???.  ?????? ???? ?? ???????.  ??????? ?????? ?? ???.  ????? ???? ?? ?????. ???? ???????? ??? ????? ?? ??????? ???????:  ???? ????? ????? ?? ??????? ?? ???????.  ?????? ?????? ?? ??? ??? ??????? ?? ???????? ?? ???????.  ?????? ??????. ???? ???? ??? ??????? ????? ????? ????? ???? ???? ?????. ??? ?? ????? ??? ?? ???? ???????. ?? ???? ???????? ?????? ??? ?????. ???? ?????? ??????? ??????? (911 ?? ???????? ??????? ?????????). ?? ??? ??????? ????? ???  ????????. ????  ???? ????? ?????? ???? ??????? ??? ???? ???? ????? ?????.  ???? ???????? ??????? ??? ??? ???????. ??? ??? ????? ???? ??? ???????? ???? ????? ????? ?????? ?? ????? ??? ????.  ????? ??????? ?? ???????? ??? ??????? ????? ??????? ?????? ??? ????? ????? ????? ???? ?? ??? ???? ????. ??? ????? ?? ??? ????????? ?? ???? ?????? ????????? ???? ?????? ???? ??????? ??????. ???? ?? ?????? ??? ????? ???? ?? ???? ?? ???? ??????? ??????.? Document Revised: 07/12/2020 Document Reviewed: 07/12/2020 Else[employer] Patient Education  2024 [employer]    Emil Schaumann, MD [facility]

## 2024-02-03 NOTE — Assessment & Plan Note (Signed)
 Stable chronic condition Diet and nutrition discussed Continue simvastatin 10 mg daily

## 2024-02-03 NOTE — Assessment & Plan Note (Signed)
 Recently seen by GI doctor Recent upper and lower endoscopy reports reviewed Crohn's medications handled by [facility] office

## 2024-02-06 ENCOUNTER — Other Ambulatory Visit: Payer: Self-pay | Admitting: Emergency Medicine

## 2024-02-09 ENCOUNTER — Encounter: Payer: Self-pay | Admitting: Radiology

## 2024-02-09 ENCOUNTER — Emergency Department (HOSPITAL_COMMUNITY)

## 2024-02-09 ENCOUNTER — Emergency Department (HOSPITAL_COMMUNITY)
Admission: EM | Admit: 2024-02-09 | Discharge: 2024-02-09 | Disposition: A | Discharge status: Home / Self Care | Attending: Emergency Medicine | Admitting: Emergency Medicine

## 2024-02-09 ENCOUNTER — Other Ambulatory Visit: Payer: Self-pay

## 2024-02-09 DIAGNOSIS — W19XXXA Unspecified fall, initial encounter: Secondary | ICD-10-CM

## 2024-02-09 DIAGNOSIS — M546 Pain in thoracic spine: Secondary | ICD-10-CM | POA: Diagnosis not present

## 2024-02-09 DIAGNOSIS — M549 Dorsalgia, unspecified: Secondary | ICD-10-CM

## 2024-02-09 DIAGNOSIS — I10 Essential (primary) hypertension: Secondary | ICD-10-CM | POA: Diagnosis not present

## 2024-02-09 DIAGNOSIS — J45909 Unspecified asthma, uncomplicated: Secondary | ICD-10-CM | POA: Insufficient documentation

## 2024-02-09 DIAGNOSIS — W108XXA Fall (on) (from) other stairs and steps, initial encounter: Secondary | ICD-10-CM | POA: Diagnosis not present

## 2024-02-09 DIAGNOSIS — M545 Low back pain, unspecified: Secondary | ICD-10-CM | POA: Diagnosis present

## 2024-02-09 DIAGNOSIS — R108A1 Right flank tenderness: Secondary | ICD-10-CM | POA: Diagnosis not present

## 2024-02-09 LAB — CBC WITH DIFFERENTIAL/PLATELET
Abs Immature Granulocytes: 0.03 K/uL (ref 0.00–0.07)
Basophils Absolute: 0 K/uL (ref 0.0–0.1)
Basophils Relative: 0 %
Eosinophils Absolute: 0 K/uL (ref 0.0–0.5)
Eosinophils Relative: 0 %
HCT: 40.9 % (ref 36.0–46.0)
Hemoglobin: 13.4 g/dL (ref 12.0–15.0)
Immature Granulocytes: 1 %
Lymphocytes Relative: 10 %
Lymphs Abs: 0.6 K/uL — ABNORMAL LOW (ref 0.7–4.0)
MCH: 30.5 pg (ref 26.0–34.0)
MCHC: 32.8 g/dL (ref 30.0–36.0)
MCV: 93.2 fL (ref 80.0–100.0)
Monocytes Absolute: 0.4 K/uL (ref 0.1–1.0)
Monocytes Relative: 6 %
Neutro Abs: 5.4 K/uL (ref 1.7–7.7)
Neutrophils Relative %: 83 %
Platelets: 173 K/uL (ref 150–400)
RBC: 4.39 MIL/uL (ref 3.87–5.11)
RDW: 14.2 % (ref 11.5–15.5)
WBC: 6.4 K/uL (ref 4.0–10.5)
nRBC: 0 % (ref 0.0–0.2)

## 2024-02-09 LAB — I-STAT CHEM 8, ED
BUN: 7 mg/dL — ABNORMAL LOW (ref 8–23)
Calcium, Ion: 1.12 mmol/L — ABNORMAL LOW (ref 1.15–1.40)
Chloride: 107 mmol/L (ref 98–111)
Creatinine, Ser: 0.4 mg/dL — ABNORMAL LOW (ref 0.44–1.00)
Glucose, Bld: 92 mg/dL (ref 70–99)
HCT: 39 % (ref 36.0–46.0)
Hemoglobin: 13.3 g/dL (ref 12.0–15.0)
Potassium: 3.9 mmol/L (ref 3.5–5.1)
Sodium: 140 mmol/L (ref 135–145)
TCO2: 22 mmol/L (ref 22–32)

## 2024-02-09 LAB — BASIC METABOLIC PANEL WITH GFR
Anion gap: 9 (ref 5–15)
BUN: 7 mg/dL — ABNORMAL LOW (ref 8–23)
CO2: 23 mmol/L (ref 22–32)
Calcium: 8.7 mg/dL — ABNORMAL LOW (ref 8.9–10.3)
Chloride: 106 mmol/L (ref 98–111)
Creatinine, Ser: 0.49 mg/dL (ref 0.44–1.00)
GFR, Estimated: >60 mL/min (ref >60)
Glucose, Bld: 90 mg/dL (ref 70–99)
Potassium: 3.8 mmol/L (ref 3.5–5.1)
Sodium: 138 mmol/L (ref 135–145)

## 2024-02-09 MED ORDER — IOHEXOL 350 MG/ML SOLN
75.0000 mL | Freq: Once | INTRAVENOUS | Status: AC | PRN
  Administered 2024-02-09: 75 mL via INTRAVENOUS

## 2024-02-09 MED ORDER — METHOCARBAMOL 500 MG PO TABS: 500.0000 mg | ORAL_TABLET | Freq: Two times a day (BID) | ORAL | 0 refills | Status: AC

## 2024-02-09 MED ORDER — FENTANYL CITRATE (PF) 50 MCG/ML IJ SOSY
50.0000 ug | PREFILLED_SYRINGE | Freq: Once | INTRAMUSCULAR | Status: AC
  Administered 2024-02-09: 50 ug via INTRAVENOUS
  Filled 2024-02-09: qty 1

## 2024-02-09 NOTE — ED Notes (Signed)
 Patient transported to CT

## 2024-02-09 NOTE — ED Notes (Signed)
 EDP at [facility]
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
## 2024-02-09 NOTE — Discharge Instructions (Addendum)
????? ???:  ????? ??? ??? ????? ?????? ??? ???????? ?????. ???? ?? ????? ????? ??????. ????? ????? ????? ???? ??????. ????? ?????? ???? ?????? ??? ??????? ??????? ???????? ???????? ??? ?????? ?????? ?????? ??????. ???? ???????? ???????? ???? ?????? ???????? ??????? ??? ???? ?? ?????? ?????. ????????? ?? ??? ???? ?? ???? ?? ???? ?? ?????? ?? ?? ????? ????.  ?????? ????????:  ???? ???????? ?? ?????? ??????? ???? ??????? ?? ??????? ???? ???????.  ?? ????? ?????? ?? ????? ?????? ?????? ?? ????? (?? ????) ???????? ????? ???. ????? ??????? ??????? (?? ????) ?????.  ???? ?????? ??? ??? ??????? ?? ??????? ???? ???????   911 ??? ????? ???? ?? ?????? ?? ??? ?? ??????? ?? ??? ????? ?? ??? ?????? ?? ???? ?? ?????? ???????? ?? ??? ????? ?? ??? ?????? ???????? ????? ????? ??? ????? ???? ?????.  ????? ??? ??????. ????? ??? ?????? ??????.  ??? ?? ???????  Chanie Hauge:  Thank you for allowing us  to take care of you today.  We hope you begin feeling better soon. You were seen today for fall.  While you are here we performed CT imaging as well as lab work and physical examination.  Your lab work and CT images were reassuring against any traumatic injuries.  Specifically, there were no fractures, bleeds, injury to any solid organs.  To-Do:  Please follow-up with your primary doctor within the next 2-3 days. It is important that you review any labs or imaging results (if any) that you had today with them. Your preliminary imaging results (if any) are attached. Please return to the Emergency Department or call 911 if you experience chest pain, shortness of breath, severe pain, severe fever, altered mental status, or have any reason to think that you need emergency medical care.  Thank you again.  Hope you feel better soon.  Department of Emergency Medicine

## 2024-02-09 NOTE — ED Notes (Signed)
 CCMD called and pt placed on monitor

## 2024-02-12 ENCOUNTER — Encounter: Payer: Self-pay | Admitting: Emergency Medicine

## 2024-02-12 ENCOUNTER — Ambulatory Visit (INDEPENDENT_AMBULATORY_CARE_PROVIDER_SITE_OTHER): Admitting: Emergency Medicine

## 2024-02-12 VITALS — BP 120/84 | HR 77 | Ht 69.0 in | Wt 174.0 lb

## 2024-02-12 DIAGNOSIS — T07XXXA Unspecified multiple injuries, initial encounter: Secondary | ICD-10-CM | POA: Diagnosis not present

## 2024-02-12 DIAGNOSIS — Z09 Encounter for follow-up examination after completed treatment for conditions other than malignant neoplasm: Secondary | ICD-10-CM

## 2024-02-12 MED ORDER — HYDROCODONE-ACETAMINOPHEN 5-325 MG PO TABS: 1.0000 | ORAL_TABLET | Freq: Four times a day (QID) | ORAL | 0 refills | Status: AC | PRN

## 2024-02-12 NOTE — Assessment & Plan Note (Signed)
 Clinically stable.  No red flag signs or symptoms Pain suboptimally controlled Pain management discussed Reviewed CT scan reports from emergency department visit 2 days ago Recommend Tylenol  for mild to moderate pain and Norco for moderate to severe pain. ED precautions given Recommend physical therapy but patient will be traveling in 2 weeks and does not want to start before then. Will need referral to orthopedics if no better or worse during the next several weeks or when she returns from her trip.

## 2024-02-12 NOTE — Progress Notes (Signed)
 Lindsey Massey 63 y.o.   Chief Complaint  Patient presents with   Follow-up    Pt states that she is not doing better she is in a lot of pain     HISTORY OF PRESENT ILLNESS: This is a 63 y.o. female here for follow-up of emergency department visit on 02/09/2024 when she presented after a fall Sustained multiple contusions.  Multiple CT scans were negative for fractures or internal injuries.  HPI/ROS:  Lindsey Massey is a 63 y.o. female with a PMH osteoporosis, CPS, Crohn's disease, HTN who presents to the ED after a fall.  Patient was BIBEMS from home after she fell down the stairs to her attic (approximately 6 feet) onto her back.  She endorses hitting her head but denies any LOC.  She arrives HDS, ABCs intact, c-collar applied, 100 mcg of fentanyl  given and route.  She is endorsing pain in her mid and lower back as well as right flank. History obtained with video interpretor.    Physical exam is notable for: -ABCs intact, bilateral breath sounds -- Thoracic and lumbar midline tenderness -- Right flank tenderness -- abdomen is soft, nonrigid, nondistended   On my initial evaluation, patient is:  -Vital signs stable. Patient afebrile, hemodynamically stable, and non-toxic appearing.   CBC, CMP, EKG without medically relevant abnormalities. CT head, cervical spine, CAP, and T and L without any acute abnormalities or traumatic injuries. Chronic changes of osteoarthritis seen. Patient given pain medication with resolution of presentation.   This patient's current presentation, including their history and physical exam, is most consistent with musculoskeletal strain following mechanical fall. No evidence of syncope, spinal injury, shock, neurologic injury.   Disposition:   I discussed the plan for discharge with the patient and/or their surrogate at bedside prior to discharge and they were in agreement with the plan and verbalized understanding of the return precautions provided. All  questions answered to the best of my ability. Ultimately, the patient was discharged in stable condition with stable vital signs. I am reassured that they are capable of close follow up and good social support at home.    Clinical Impression:  1. Fall, initial encounter   2. Acute on chronic back pain     HPI   Prior to Admission medications   Medication Sig Start Date End Date Taking? Authorizing Provider  adalimumab  (HUMIRA , 2 PEN,) 40 MG/0.4ML pen Inject 0.4 mLs (40 mg total) into the skin every 14 (fourteen) days. Inject 40 mg day 29 then every 2 weeks 01/29/24  Yes Mansouraty, Aloha Raddle., MD  adalimumab  (HUMIRA -CD/UC/HS STARTER) 80 MG/0.8ML pen Inject 160 mg day 1, inject 80 mg day 15 10/24/23  Yes Mansouraty, Aloha Raddle., MD  ALPRAZolam  (XANAX ) 0.5 MG tablet TAKE 1 TABLET BY MOUTH EVERY DAY AS NEEDED FOR ANXIETY 01/21/24  Yes Connor Foxworthy, Emil Schanz, MD  aspirin  EC 81 MG tablet Take 1 tablet (81 mg total) by mouth daily. Swallow whole. 01/24/23  Yes Mansouraty, Aloha Raddle., MD  azaTHIOprine  (IMURAN ) 50 MG tablet TAKE 1 TABLET BY MOUTH DAILY 07/23/23  Yes Mansouraty, Aloha Raddle., MD  cephALEXin  (KEFLEX ) 500 MG capsule Take 1 capsule (500 mg total) by mouth 4 (four) times daily. 10/05/23  Yes Upstill, Margit, PA-C  Cholecalciferol  (VITAMIN D3) 125 MCG (5000 UT) CAPS Take 1 capsule (5,000 Units total) by mouth daily. 01/24/23  Yes Mansouraty, Aloha Raddle., MD  conjugated estrogens  (PREMARIN ) vaginal cream PLACE ONE-HALF APPLICATORFUL  VAGINALLY 3 TIMES WEEKLY 01/29/24  Yes Chrzanowski, Jami B, NP  Cyanocobalamin (VITAMIN B 12 PO) Take by mouth.   Yes [provider]  esomeprazole  (NEXIUM ) 40 MG capsule TAKE 1 CAPSULE BY MOUTH DAILY 08/14/23  Yes Mansouraty, Gabriel Jr., MD  fluticasone (FLONASE) 50 MCG/ACT nasal spray Place 1 spray into both nostrils 2 (two) times daily. 09/05/23  Yes [provider]  ibandronate  (BONIVA ) 150 MG tablet Take 1 tablet (150 mg total) by mouth every 30  (thirty) days. Take in the morning with a full glass of water, on an empty stomach, and do not take anything else by mouth or lie down for the next 30 min. 07/04/23  Yes Hiram Mciver, Emil Schanz, MD  ibuprofen  (ADVIL ) 600 MG tablet Take 1 tablet (600 mg total) by mouth every 6 (six) hours as needed. 10/05/23  Yes Odell Balls, PA-C  Magnesium  250 MG CAPS Take 250 mg by mouth daily. Takes 500 04/30/23  Yes Sanae Willetts, Emil Schanz, MD  meloxicam (MOBIC) 15 MG tablet Take 15 mg by mouth daily as needed. 10/08/23  Yes [provider]  methocarbamol  (ROBAXIN ) 500 MG tablet Take 1 tablet (500 mg total) by mouth 2 (two) times daily. 02/09/24  Yes Almousa, Olla, MD  methocarbamol  (ROBAXIN -750) 750 MG tablet Take 1 tablet (750 mg total) by mouth 2 (two) times daily as needed for muscle spasms. 05/06/23  Yes Jule Ronal CROME, PA-C  mirtazapine  (REMERON ) 15 MG tablet Take 1 tablet (15 mg total) by mouth at bedtime. 06/16/23  Yes Aquita Simmering, Emil Schanz, MD  mupirocin  ointment (BACTROBAN ) 2 % Apply 1 Application topically 2 (two) times daily. 06/16/23  Yes Jahmia Berrett Jose, MD  ofloxacin (OCUFLOX) 0.3 % ophthalmic solution PLEASE SEE ATTACHED FOR DETAILED DIRECTIONS 07/01/23  Yes [provider]  prednisoLONE  acetate (PRED FORTE ) 1 % ophthalmic suspension Place 1 drop into the left eye 4 (four) times daily. 06/16/23  Yes Brinleigh Tew, Emil Schanz, MD  predniSONE  (DELTASONE ) 10 MG tablet Take by mouth. 08/10/23  Yes [provider]  predniSONE  (STERAPRED UNI-PAK 21 TAB) 10 MG (21) TBPK tablet Take by mouth daily. Take 6 tabs by mouth daily  for 2 days, then 5 tabs for 2 days, then 4 tabs for 2 days, then 3 tabs for 2 days, 2 tabs for 2 days, then 1 tab by mouth daily for 2 days 08/10/23  Yes Donnajean Lynwood DEL, PA-C  sertraline  (ZOLOFT ) 50 MG tablet TAKE 1 TABLET BY MOUTH EVERY DAY 02/06/24  Yes Winnie Barsky, Emil Schanz, MD  simvastatin  (ZOCOR ) 20 MG tablet Take 1 tablet (20 mg total) by mouth daily. 02/03/24   Yes Parissa Chiao, Emil Schanz, MD  calcium  carbonate (CALCIUM  ANTACID) 500 MG chewable tablet Chew 1 tablet (200 mg of elemental calcium  total) by mouth daily. Patient not taking: Reported on 02/12/2024 01/24/23   Mansouraty, Aloha Raddle., MD  sucralfate  (CARAFATE ) 1 g tablet TAKE 1 TABLET BY MOUTH TWICE  DAILY 01/29/24   Mansouraty, Aloha Raddle., MD  valsartan  (DIOVAN ) 160 MG tablet Take 1 tablet (160 mg total) by mouth daily. 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) 01/08/2023   Immunosuppression due to drug therapy 01/08/2023   Dyslipidemia 09/16/2011   Osteoporosis without current pathological fracture 09/16/2011   GERD without esophagitis 09/16/2011   Uterine fibroid 06/26/2011   Depression 06/26/2011   Hypercholesterolemia 06/26/2011    Past Medical History:  Diagnosis Date   Asthma    Cataract    Crohn disease (HCC)    Depressed    GERD (gastroesophageal reflux disease)    High cholesterol    Hx of tonsillectomy    Hypercholesteremia    Hypertension    Osteoporosis    Shortness of breath    sometimes before sleep   Uterine fibroid     Past Surgical History:  Procedure Laterality Date   COLONOSCOPY  04/2023   DILATION AND CURRETTAGE     LASIK     [place] and then [place]    TONSILLECTOMY     age 51   TONSILLECTOMY AND ADENOIDECTOMY      Social History   Socioeconomic History   Marital status: Divorced    Spouse  name: Not on file   Number of children: Not on file   Years of education: Not on file   Highest education level: 12th grade  Occupational History   Not on file  Tobacco Use   Smoking status: Every Day    Current packs/day: 5.00    Average packs/day: 5.0 packs/day for 11.8 years (59.2 ttl pk-yrs)    Types: Cigarettes    Start date: 2014   Smokeless tobacco: Not on file  Vaping Use   Vaping status: Never Used  Substance and Sexual Activity   Alcohol use: No   Drug use: No   Sexual activity: Not Currently    Partners: Male    Birth control/protection: Post-menopausal    Comment: menarche 63yo, sexual debut 63yo  Other Topics Concern   Not on file  Social History Narrative   Lives with sister and nephews (18,16) in [place]. Moved from [place]  in April 2012. Separated from husband. No children. Does not work.      Balanced diet. Little exercise.      Christian faith.   Social Drivers of Corporate Investment Banker Strain: High Risk (01/30/2024)   Overall Financial Resource Strain (CARDIA)    Difficulty of Paying Living Expenses: Very hard  Food Insecurity: Food Insecurity Present (01/30/2024)   Hunger Vital Sign    Worried About Running Out of Food in the Last Year: Sometimes true    Ran Out of Food in the Last Year: Sometimes true  Transportation Needs: Unmet Transportation Needs (01/30/2024)   PRAPARE - Administrator, Civil Service (Medical): Yes    Lack of Transportation (Non-Medical): Yes  Physical Activity: Inactive (01/30/2024)   Exercise Vital Sign    Days of Exercise per Week: 0 days    Minutes of Exercise per Session: Not on file  Stress: Stress Concern Present (01/30/2024)   [employer] of Occupational Health - Occupational Stress Questionnaire    Feeling of Stress: Very much  Social Connections: Socially Isolated (01/30/2024)   Social Connection and Isolation Panel    Frequency of Communication with Friends and Family: Once a week     Frequency of Social Gatherings with Friends and Family: Never    Attends Religious Services: Never    Database Administrator or Organizations: No    Attends Banker Meetings: Not on file  Marital Status: Widowed  Catering Manager Violence: Not on file    Family History  Problem Relation Age of Onset   Arthritis Mother    Hypertension Mother    Heart disease Mother    Cancer Father        liver   Hypertension Father    Arthritis Sister    Hypertension Brother    Multiple sclerosis Brother    Heart disease Paternal Uncle    Anesthesia problems Neg Hx    Colon cancer Neg Hx    Esophageal cancer Neg Hx    Inflammatory bowel disease Neg Hx    Liver disease Neg Hx    Pancreatic cancer Neg Hx    Rectal cancer Neg Hx    Stomach cancer Neg Hx    Colon polyps Neg Hx      Review of Systems  Constitutional: Negative.  Negative for chills and fever.  HENT: Negative.  Negative for congestion and sore throat.   Respiratory: Negative.  Negative for cough and shortness of breath.   Cardiovascular: Negative.  Negative for chest pain and palpitations.  Gastrointestinal:  Negative for abdominal pain, diarrhea, nausea and vomiting.  Genitourinary: Negative.  Negative for dysuria and hematuria.  Skin: Negative.  Negative for rash.  Neurological: Negative.  Negative for dizziness and headaches.  All other systems reviewed and are negative.   Vitals:   02/12/24 1417  BP: 120/84  Pulse: 77  SpO2: 96%    Physical Exam Vitals reviewed.  Constitutional:      Appearance: Normal appearance.  HENT:     Head: Normocephalic.     Mouth/Throat:     Mouth: Mucous membranes are moist.     Pharynx: Oropharynx is clear.  Eyes:     Extraocular Movements: Extraocular movements intact.     Pupils: Pupils are equal, round, and reactive to light.  Cardiovascular:     Rate and Rhythm: Normal rate and regular rhythm.     Pulses: Normal pulses.     Heart sounds: Normal heart sounds.   Pulmonary:     Effort: Pulmonary effort is normal.     Breath sounds: Normal breath sounds.  Abdominal:     Palpations: Abdomen is soft.     Tenderness: There is no abdominal tenderness.  Musculoskeletal:     Cervical back: No tenderness.  Lymphadenopathy:     Cervical: No cervical adenopathy.  Skin:    General: Skin is warm and dry.     Capillary Refill: Capillary refill takes less than 2 seconds.  Neurological:     General: No focal deficit present.     Mental Status: She is alert and oriented to person, place, and time.  Psychiatric:        Mood and Affect: Mood normal.        Behavior: Behavior normal.      ASSESSMENT & PLAN: A total of 40 minutes was spent with the patient and counseling/coordination of care regarding preparing for this visit, review of most recent office visit notes, review of most recent emergency department visit notes, review of most recent imaging reports, review of multiple chronic medical conditions and their management, review of all medications, review of most recent bloodwork results, pain management, prognosis, documentation, and need for follow up.   Problem List Items Addressed This Visit       Other   Multiple contusions - Primary   Clinically stable.  No red flag signs or symptoms Pain suboptimally controlled Pain management  discussed Reviewed CT scan reports from emergency department visit 2 days ago Recommend Tylenol  for mild to moderate pain and Norco for moderate to severe pain. ED precautions given Recommend physical therapy but patient will be traveling in 2 weeks and does not want to start before then. Will need referral to orthopedics if no better or worse during the next several weeks or when she returns from her trip.      Relevant Medications   HYDROcodone-acetaminophen  (NORCO/VICODIN) 5-325 MG tablet   Other Visit Diagnoses       Hospital discharge follow-up          Patient Instructions  ?????? Contusion ??????  ??? ?????. ????? ?????? ?? ???????? ???????? ??????? ???????? ???? ?????. ???????? ???? ?????? ??? ?????. ??? ????? ????? ?????? ?????? ???? ?????? ?? ??????? ?? ??????. ??????? ??????? ????? ???? ??? ?????? ??? ?????? ?????? ??? ???? ????? ?? ??? ????? ??????? ???? ??????. ???? ????????? ??????? ?? ??????: ????? ????? ???????? ???? ???? ??? ??????? ???? ???????. ????? ???? ??????? ?????? ??????? ?? ????. ???? ????????? ??????? ??? ?????? ?????. ??????? ??? ????? ??????? ???????   ????? ??????? ??????? ???? ????? ????? ?????? (?????? ?????) ????? ?????? (??? ???????). ??? ?? ????? ??????????? ???????? ???? ?????????? RICE. ? ???? ??? ????? ??????? ???????. ????? ?????? ?????? ??????? ????? ???????? ??????? ??????? ??????. ???????? ???? ??????? ?????? ???? ??????? ?????? ???. ? ?? ????? ??? ??????? ??????? ??? ?? ?????? ????. ????? ??? ?????? ??????? ???????: ? ?? ????? ?? ??? ???????. ? ?? ????? ??? ???? ??????. ? ???? ????? ???? 20 ?????? ????? 2-3 ???? ??????. ? ??? ???? ??? ????? ??? ????? ?????? ??????? ???? ????? ????? ??? ?? ????? ?????? ?? ?????. ?????? ??? ?????? ???? ??? ???????? ??? ??? ?? ?????? ?????? ?????? ?? ??????? ?? ???????. ? ???? ???? ??? ??????? ??????? ???????? ????? ?????? ????? ?????????. ???? ?? ??? ?? ??????? ?????? ??????. ???? ??????? ???? ????? ??? ??????? ???? ??????? ??????. ? ???? ??????? ??????? ??? ????? ???? ?? ????? ?? ????? ????? ?? ???????? ?? ????. ??????? ????  ????? ??????? ??? ?? ????? ?? ???? ??????? ??????? ????? ????? ????? ???? ?? ??? ???? ????.  ????? ????? ?????? ????????. ?? ???? ??????? ??????? ?????? ??????? ?? ?? ?????? ???? ??????? ?? ??????. ???? ?????? ??????? ?????? ?? ??????? ???????:  ??? ?? ????? ??????? ???? ????? ???? ??? ???? ???? ????? ?? ??????.  ????? ??????? ??????? ?????? ????? ??????.  ?????? ????? ?? ????? ??????? ???????. ???? ???????? ????? ?? ??????? ???????:  ?????? ????? ????.  ?????? ???? ?? ???? ?? ?????.  ???? ??  ????? ???? ?? ?????. ??? ????? ?? ??? ????????? ?? ???? ?????? ????????? ???? ?????? ???? ??????? ??????. ???? ?? ?????? ??? ????? ???? ?? ???? ?? ???? ??????? ??????.? Document Revised: 09/21/2021 Document Reviewed: 09/21/2021 Elsevier Patient Education  2024 [employer]    Emil Schaumann, MD [facility]

## 2024-02-12 NOTE — Patient Instructions (Signed)
??????   Contusion ?????? ??? ?????. ????? ?????? ?? ???????? ???????? ??????? ???????? ???? ?????. ???????? ???? ?????? ??? ?????. ??? ????? ????? ?????? ?????? ???? ?????? ?? ??????? ?? ??????. ??????? ??????? ????? ???? ??? ?????? ??? ?????? ?????? ??? ???? ????? ?? ??? ????? ??????? ???? ??????. ???? ????????? ??????? ?? ??????: ????? ????? ???????? ???? ???? ??? ??????? ???? ???????. ????? ???? ??????? ?????? ??????? ?? ????. ???? ????????? ??????? ??? ?????? ?????. ??????? ??? ????? ??????? ???????   ????? ??????? ??????? ???? ????? ????? ?????? (?????? ?????) ????? ?????? (??? ???????). ??? ?? ????? ??????????? ???????? ???? ?????????? RICE. ? ???? ??? ????? ??????? ???????. ????? ?????? ?????? ??????? ????? ???????? ??????? ??????? ??????. ???????? ???? ??????? ?????? ???? ??????? ?????? ???. ? ?? ????? ??? ??????? ??????? ??? ?? ?????? ????. ????? ??? ?????? ??????? ???????: ? ?? ????? ?? ??? ???????. ? ?? ????? ??? ???? ??????. ? ???? ????? ???? 20 ?????? ????? 2-3 ???? ??????. ? ??? ???? ??? ????? ??? ????? ?????? ??????? ???? ????? ????? ??? ?? ????? ?????? ?? ?????. ?????? ??? ?????? ???? ??? ???????? ??? ??? ?? ?????? ?????? ?????? ?? ??????? ?? ???????. ? ???? ???? ??? ??????? ??????? ???????? ????? ?????? ????? ?????????. ???? ?? ??? ?? ??????? ?????? ??????. ???? ??????? ???? ????? ??? ??????? ???? ??????? ??????. ? ???? ??????? ??????? ??? ????? ???? ?? ????? ?? ????? ????? ?? ???????? ?? ????. ??????? ????  ????? ??????? ??? ?? ????? ?? ???? ??????? ??????? ????? ????? ????? ???? ?? ??? ???? ????.  ????? ????? ?????? ????????. ?? ???? ??????? ??????? ?????? ??????? ?? ?? ?????? ???? ??????? ?? ??????. ???? ?????? ??????? ?????? ?? ??????? ???????:  ??? ?? ????? ??????? ???? ????? ???? ??? ???? ???? ????? ?? ??????.  ????? ??????? ??????? ?????? ????? ??????.  ?????? ????? ?? ????? ??????? ???????. ???? ???????? ????? ?? ??????? ???????:  ?????? ????? ????.  ?????? ???? ??  ???? ?? ?????.  ???? ?? ????? ???? ?? ?????. ??? ????? ?? ??? ????????? ?? ???? ?????? ????????? ???? ?????? ???? ??????? ??????. ???? ?? ?????? ??? ????? ???? ?? ???? ?? ???? ??????? ??????.? Document Revised: 09/21/2021 Document Reviewed: 09/21/2021 [employer] Patient Education  2024 [employer].

## 2024-02-19 ENCOUNTER — Other Ambulatory Visit: Payer: Self-pay

## 2024-02-22 ENCOUNTER — Other Ambulatory Visit: Payer: Self-pay | Admitting: Physician Assistant

## 2024-02-23 ENCOUNTER — Other Ambulatory Visit: Payer: Self-pay

## 2024-02-23 ENCOUNTER — Other Ambulatory Visit (HOSPITAL_COMMUNITY): Payer: Self-pay

## 2024-02-23 NOTE — Progress Notes (Signed)
 Specialty Pharmacy Refill Coordination Note  Lindsey Massey is a 63 y.o. female contacted today regarding refills of specialty medication(s) Adalimumab  (Humira  (2 Pen), Humira -CD/UC/HS Starter)   Patient requested Delivery   Delivery date: 02/24/24   Verified address: [address]   [place] [phone]   Medication will be filled on: 02/23/24

## 2024-02-23 NOTE — Telephone Encounter (Signed)
 Has not been seen by xu/myself since january

## 2024-02-25 ENCOUNTER — Other Ambulatory Visit: Payer: Self-pay

## 2024-03-10 ENCOUNTER — Other Ambulatory Visit: Payer: Self-pay

## 2024-03-10 ENCOUNTER — Other Ambulatory Visit (HOSPITAL_COMMUNITY): Payer: Self-pay

## 2024-03-10 NOTE — Progress Notes (Signed)
 Specialty Pharmacy Refill Coordination Note  Lindsey Massey is a 63 y.o. female contacted today regarding refills of specialty medication(s) Adalimumab  (Humira  (2 Pen), Humira -CD/UC/HS Starter)   Patient requested Delivery   Delivery date: 03/12/24   Verified address: [address]   [name] [phone]   Medication will be filled on: 03/11/24

## 2024-03-11 ENCOUNTER — Other Ambulatory Visit (HOSPITAL_COMMUNITY): Payer: Self-pay

## 2024-03-11 ENCOUNTER — Other Ambulatory Visit: Payer: Self-pay

## 2024-03-11 ENCOUNTER — Other Ambulatory Visit: Payer: Self-pay | Admitting: Gastroenterology

## 2024-03-26 ENCOUNTER — Other Ambulatory Visit: Payer: Self-pay

## 2024-03-26 NOTE — Progress Notes (Signed)
 Specialty Pharmacy Refill Coordination Note  Lindsey Massey is a 63 y.o. female contacted today regarding refills of specialty medication(s) Adalimumab  (Humira  (2 Pen), Humira -CD/UC/HS Starter)   Patient requested Delivery   Delivery date: 04/06/24   Verified address: [address]   [place] [phone]   Medication will be filled on: 04/05/24

## 2024-04-05 ENCOUNTER — Other Ambulatory Visit: Payer: Self-pay

## 2024-04-07 ENCOUNTER — Other Ambulatory Visit: Payer: Self-pay

## 2024-04-12 ENCOUNTER — Other Ambulatory Visit: Payer: Self-pay

## 2024-04-26 ENCOUNTER — Other Ambulatory Visit: Payer: Self-pay

## 2024-04-26 NOTE — Progress Notes (Signed)
 Specialty Pharmacy Refill Coordination Note  Lindsey Massey is a 64 y.o. female contacted today regarding refills of specialty medication(s) Adalimumab  (Humira  (2 Pen), Humira -CD/UC/HS Starter)   Patient requested Delivery   Delivery date: 05/04/24   Verified address: [address]   [place] [phone]   Medication will be filled on: 05/03/24

## 2024-04-28 ENCOUNTER — Other Ambulatory Visit: Payer: Self-pay

## 2024-04-29 ENCOUNTER — Encounter: Payer: Self-pay | Admitting: Gastroenterology

## 2024-05-05 ENCOUNTER — Other Ambulatory Visit: Payer: Self-pay

## 2024-05-12 ENCOUNTER — Other Ambulatory Visit (HOSPITAL_COMMUNITY): Payer: Self-pay
